# Patient Record
Sex: Male | Born: 1940 | Race: White | Hispanic: No | Marital: Married | Smoking: Former smoker
Health system: Southern US, Community
[De-identification: ages and names within clinical notes are randomized; demographics above are authoritative.]

## PROBLEM LIST (undated history)

## (undated) DIAGNOSIS — F0390 Unspecified dementia without behavioral disturbance: Secondary | ICD-10-CM

## (undated) DIAGNOSIS — E119 Type 2 diabetes mellitus without complications: Secondary | ICD-10-CM

## (undated) DIAGNOSIS — E7800 Pure hypercholesterolemia, unspecified: Secondary | ICD-10-CM

## (undated) DIAGNOSIS — T148XXA Other injury of unspecified body region, initial encounter: Secondary | ICD-10-CM

## (undated) DIAGNOSIS — J189 Pneumonia, unspecified organism: Secondary | ICD-10-CM

## (undated) DIAGNOSIS — I639 Cerebral infarction, unspecified: Secondary | ICD-10-CM

## (undated) DIAGNOSIS — F32A Depression, unspecified: Secondary | ICD-10-CM

## (undated) DIAGNOSIS — K219 Gastro-esophageal reflux disease without esophagitis: Secondary | ICD-10-CM

## (undated) DIAGNOSIS — E0590 Thyrotoxicosis, unspecified without thyrotoxic crisis or storm: Secondary | ICD-10-CM

## (undated) DIAGNOSIS — I719 Aortic aneurysm of unspecified site, without rupture: Secondary | ICD-10-CM

## (undated) DIAGNOSIS — I509 Heart failure, unspecified: Secondary | ICD-10-CM

## (undated) DIAGNOSIS — F329 Major depressive disorder, single episode, unspecified: Secondary | ICD-10-CM

## (undated) DIAGNOSIS — C4490 Unspecified malignant neoplasm of skin, unspecified: Secondary | ICD-10-CM

## (undated) DIAGNOSIS — G4730 Sleep apnea, unspecified: Secondary | ICD-10-CM

## (undated) DIAGNOSIS — I739 Peripheral vascular disease, unspecified: Secondary | ICD-10-CM

## (undated) DIAGNOSIS — E669 Obesity, unspecified: Secondary | ICD-10-CM

## (undated) DIAGNOSIS — R350 Frequency of micturition: Secondary | ICD-10-CM

## (undated) DIAGNOSIS — I4891 Unspecified atrial fibrillation: Secondary | ICD-10-CM

## (undated) DIAGNOSIS — Z952 Presence of prosthetic heart valve: Secondary | ICD-10-CM

## (undated) DIAGNOSIS — R339 Retention of urine, unspecified: Secondary | ICD-10-CM

## (undated) DIAGNOSIS — I2510 Atherosclerotic heart disease of native coronary artery without angina pectoris: Secondary | ICD-10-CM

## (undated) DIAGNOSIS — I10 Essential (primary) hypertension: Secondary | ICD-10-CM

## (undated) HISTORY — DX: Unspecified atrial fibrillation: I48.91

## (undated) HISTORY — DX: Unspecified dementia, unspecified severity, without behavioral disturbance, psychotic disturbance, mood disturbance, and anxiety: F03.90

## (undated) HISTORY — DX: Gastro-esophageal reflux disease without esophagitis: K21.9

## (undated) HISTORY — DX: Unspecified malignant neoplasm of skin, unspecified: C44.90

## (undated) HISTORY — PX: NECK SURGERY: SHX720

## (undated) HISTORY — PX: HERNIA REPAIR: SHX51

## (undated) HISTORY — DX: Obesity, unspecified: E66.9

## (undated) HISTORY — DX: Atherosclerotic heart disease of native coronary artery without angina pectoris: I25.10

## (undated) HISTORY — DX: Frequency of micturition: R35.0

## (undated) HISTORY — DX: Other injury of unspecified body region, initial encounter: T14.8XXA

## (undated) HISTORY — DX: Pure hypercholesterolemia, unspecified: E78.00

## (undated) HISTORY — DX: Heart failure, unspecified: I50.9

## (undated) HISTORY — DX: Depression, unspecified: F32.A

## (undated) HISTORY — DX: Pneumonia, unspecified organism: J18.9

## (undated) HISTORY — DX: Essential (primary) hypertension: I10

## (undated) HISTORY — DX: Major depressive disorder, single episode, unspecified: F32.9

## (undated) HISTORY — PX: CIRCUMCISION: SUR203

## (undated) HISTORY — DX: Retention of urine, unspecified: R33.9

## (undated) HISTORY — PX: CORONARY ARTERY BYPASS GRAFT: SHX141

## (undated) HISTORY — PX: CARDIAC SURGERY: SHX584

## (undated) HISTORY — DX: Type 2 diabetes mellitus without complications: E11.9

## (undated) HISTORY — DX: Sleep apnea, unspecified: G47.30

## (undated) HISTORY — PX: OTHER SURGICAL HISTORY: SHX169

---

## 2000-12-26 ENCOUNTER — Encounter: Payer: Self-pay | Admitting: Family Medicine

## 2000-12-26 ENCOUNTER — Ambulatory Visit (HOSPITAL_COMMUNITY): Admission: RE | Admit: 2000-12-26 | Discharge: 2000-12-26 | Payer: Self-pay | Admitting: Family Medicine

## 2001-01-22 ENCOUNTER — Ambulatory Visit (HOSPITAL_COMMUNITY): Admission: RE | Admit: 2001-01-22 | Discharge: 2001-01-22 | Payer: Self-pay | Admitting: Cardiology

## 2001-01-22 ENCOUNTER — Encounter: Payer: Self-pay | Admitting: Cardiology

## 2001-01-26 ENCOUNTER — Ambulatory Visit (HOSPITAL_COMMUNITY): Admission: RE | Admit: 2001-01-26 | Discharge: 2001-01-26 | Payer: Self-pay | Admitting: Cardiology

## 2001-02-23 ENCOUNTER — Encounter: Payer: Self-pay | Admitting: Cardiothoracic Surgery

## 2001-02-27 ENCOUNTER — Encounter: Payer: Self-pay | Admitting: Cardiothoracic Surgery

## 2001-02-27 ENCOUNTER — Inpatient Hospital Stay (HOSPITAL_COMMUNITY): Admission: RE | Admit: 2001-02-27 | Discharge: 2001-03-05 | Payer: Self-pay | Admitting: Cardiothoracic Surgery

## 2001-02-28 ENCOUNTER — Encounter: Payer: Self-pay | Admitting: Cardiothoracic Surgery

## 2001-03-01 ENCOUNTER — Encounter: Payer: Self-pay | Admitting: Cardiothoracic Surgery

## 2001-03-02 ENCOUNTER — Encounter: Payer: Self-pay | Admitting: Cardiothoracic Surgery

## 2001-04-03 ENCOUNTER — Encounter (HOSPITAL_COMMUNITY): Admission: RE | Admit: 2001-04-03 | Discharge: 2001-07-02 | Payer: Self-pay | Admitting: Cardiology

## 2002-02-14 ENCOUNTER — Encounter: Payer: Self-pay | Admitting: Emergency Medicine

## 2002-02-14 ENCOUNTER — Inpatient Hospital Stay (HOSPITAL_COMMUNITY): Admission: EM | Admit: 2002-02-14 | Discharge: 2002-02-16 | Payer: Self-pay | Admitting: Emergency Medicine

## 2004-03-05 ENCOUNTER — Ambulatory Visit: Payer: Self-pay | Admitting: Cardiology

## 2004-06-25 ENCOUNTER — Ambulatory Visit: Payer: Self-pay | Admitting: Family Medicine

## 2004-12-13 ENCOUNTER — Ambulatory Visit: Payer: Self-pay | Admitting: Cardiology

## 2004-12-31 ENCOUNTER — Ambulatory Visit: Payer: Self-pay

## 2004-12-31 ENCOUNTER — Ambulatory Visit: Payer: Self-pay | Admitting: Cardiology

## 2005-01-11 ENCOUNTER — Ambulatory Visit: Payer: Self-pay

## 2005-01-27 ENCOUNTER — Ambulatory Visit: Payer: Self-pay | Admitting: Cardiology

## 2008-08-26 ENCOUNTER — Encounter: Payer: Self-pay | Admitting: Cardiology

## 2013-07-02 ENCOUNTER — Encounter: Payer: Self-pay | Admitting: *Deleted

## 2013-07-03 ENCOUNTER — Encounter (INDEPENDENT_AMBULATORY_CARE_PROVIDER_SITE_OTHER): Payer: Self-pay

## 2013-07-03 ENCOUNTER — Encounter: Payer: Self-pay | Admitting: Neurology

## 2013-07-03 ENCOUNTER — Ambulatory Visit (INDEPENDENT_AMBULATORY_CARE_PROVIDER_SITE_OTHER): Payer: Medicare Other | Admitting: Neurology

## 2013-07-03 VITALS — BP 161/92 | HR 83 | Ht 70.0 in | Wt 193.0 lb

## 2013-07-03 DIAGNOSIS — R2681 Unsteadiness on feet: Secondary | ICD-10-CM

## 2013-07-03 DIAGNOSIS — R269 Unspecified abnormalities of gait and mobility: Secondary | ICD-10-CM

## 2013-07-03 DIAGNOSIS — R4189 Other symptoms and signs involving cognitive functions and awareness: Secondary | ICD-10-CM

## 2013-07-03 DIAGNOSIS — F09 Unspecified mental disorder due to known physiological condition: Secondary | ICD-10-CM

## 2013-07-03 DIAGNOSIS — R251 Tremor, unspecified: Secondary | ICD-10-CM

## 2013-07-03 DIAGNOSIS — R259 Unspecified abnormal involuntary movements: Secondary | ICD-10-CM

## 2013-07-03 NOTE — Progress Notes (Signed)
[facility]    Provider:  Dr Janann Colonel Referring Provider: Lillard Anes,* Primary Care Physician:  Lillard Anes, MD  CC:  Cognitive decline and gait instability  HPI:  Bobby Jacobs is a 73 y.o. male here as a referral from Dr. Henrene Pastor for cognitive decline and gait instability  Gait instability started around 5 to 6 months ago. Started having multiple falls. Started walking slower than usual, takes small shuffling steps. No freezing episodes noted. Has mild tremors of his hands, describes it as more of an action/postural tremor. Overall bradykinesia, lack of energy. No REM behavior disorder. Noted micrographia. Was given diagnosis of Parkinson's disease a few weeks ago, wife is not sure what type of doctor made this diagnosis.  Was not started on any medications. Has not done any physical therapy.   Wife notes cognitive decline for the past 7 to 8 years, has gotten progressively worse. Main difficulty with short term memory, trouble with basic ADLs, cleaning up. Remote memory remains stable. No hallucinations. Has recently had urinary incontinence, started around 1 year ago and progressively worse.   No family history of Parkinson's disease or other neurodegenerative disorders.   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  Review of Systems: Out of a complete 14 system review, the patient complains of only the following symptoms, and all other reviewed systems are negative. + fatgiue, cough, memory loss, confusion, sleepiness, snoring, slurred speech  History   Social History  . Marital Status: Single    Spouse Name: N/A    Number of Children: N/A  . Years of Education: N/A   Occupational History  . Not on file.   Social History Main Topics  . Smoking status: Never Smoker   . Smokeless tobacco: Never Used  . Alcohol Use: No  . Drug Use: No  . Sexual Activity: Not on file   Other Topics Concern   . Not on file   Social History Narrative   Married with 2 children   Right handed   college   3-4 cups daily    No family history on file.  Past Medical History  Diagnosis Date  . Hypertension   . Diabetes mellitus, type 2   . Skin cancer   . Coronary artery disease   . Depression   . GERD (gastroesophageal reflux disease)   . Cardiovascular disease   . High cholesterol   . Pneumonia   . Fracture     Past Surgical History  Procedure Laterality Date  . Hernia repair    . Cardiac surgery    . Neck surgery    . Degenerative disk disease    . Circumcision      Current Outpatient Prescriptions  Medication Sig Dispense Refill  . AMLODIPINE BESYLATE PO Take 10 mg by mouth.      Marland Kitchen aspirin 81 MG tablet Take 81 mg by mouth daily.      Marland Kitchen gabapentin (NEURONTIN) 300 MG capsule Take 300 mg by mouth 3 (three) times daily.      Marland Kitchen glucose blood test strip 1 each by Other route as needed for other. Use as instructed      . insulin detemir (LEVEMIR) 100 UNIT/ML injection Inject into the skin at bedtime.      . Insulin Pen Needle (NOVOFINE) 32G X 6 MM MISC by Does not apply route.      Marland Kitchen LORazepam (ATIVAN) 0.5 MG tablet Take 0.5 mg  by mouth every 8 (eight) hours.      Marland Kitchen METFORMIN HCL PO Take 1,000 mg by mouth.      Marland Kitchen omeprazole (PRILOSEC) 20 MG capsule Take 20 mg by mouth daily.      . QUEtiapine (SEROQUEL) 50 MG tablet Take 50 mg by mouth at bedtime.      . tamsulosin (FLOMAX) 0.4 MG CAPS capsule Take 0.4 mg by mouth.      . traZODone (DESYREL) 100 MG tablet Take 100 mg by mouth at bedtime.      . valsartan (DIOVAN) 80 MG tablet Take 80 mg by mouth daily.      Marland Kitchen warfarin (COUMADIN) 1 MG tablet Take 1 mg by mouth daily.      Marland Kitchen warfarin (COUMADIN) 2 MG tablet Take 2 mg by mouth daily.      Marland Kitchen warfarin (COUMADIN) 3 MG tablet Take 3 mg by mouth daily.      Marland Kitchen warfarin (COUMADIN) 5 MG tablet Take 5 mg by mouth daily.       No current facility-administered medications for this visit.     Allergies as of 07/03/2013  . (No Known Allergies)    Vitals: BP 161/92  Pulse 83  Ht 5\' 10"  (1.778 m)  Wt 193 lb (87.544 kg)  BMI 27.69 kg/m2 Last Weight:  Wt Readings from Last 1 Encounters:  07/03/13 193 lb (87.544 kg)   Last Height:   Ht Readings from Last 1 Encounters:  07/03/13 5\' 10"  (1.778 m)     Physical exam: Exam: Gen: NAD, conversant Eyes: anicteric sclerae, moist conjunctivae HENT: Atraumatic, oropharynx clear Neck: Trachea midline; supple,  Lungs: CTA, no wheezing, rales, rhonic                          CV: RRR, no MRG Abdomen: Soft, non-tender;  Extremities: No peripheral edema  Skin: Normal temperature, no rash,  Psych: Appropriate affect, pleasant  Neuro: MS: AA&Ox1, appropriately interactive  Speech: decreased verbal output  CN: PERRL, EOMI no nystagmus, no ptosis, sensation intact to LT V1-V3 bilat, face symmetric, no weakness, hearing grossly intact, palate elevates symmetrically, shoulder shrug 5/5 bilat,  tongue protrudes midline, no fasiculations noted.  Motor: normal bulk and tone Strength: 5/5  In all extremities  Coord: mild rest tremor bilateral hands L>R, mild bradykinesia with finger taps, hand opening, foot tapping  Reflexes: symmetrical, bilat downgoing toes  Sens: LT intact in all extremities, decreased vibration mid shin bilaterally  Gait: stands slowly, slightly stooped posture, decreased arm swing on the left   Assessment:  After physical and neurologic examination, review of laboratory studies, imaging, neurophysiology testing and pre-existing records, assessment will be reviewed on the problem list.  Plan:  Treatment plan and additional workup will be reviewed under Problem List.  1)Gait instability 2)Cognitive decline 3)tremor 4)Urinary incontinence 5)A fib, prior CVA  73y/o gentleman presenting for initial evaluation of gait instability, cognitive decline, tremor which has been getting progressively worse.  Constellation of symptoms is concerning for a parkinsonism, most notably NPH. Will check MRI brain, B12, TSH, MMA. In the future will check MOCA and can consider large volume LP depending on MRI results. Pending lab results would consider addition of SInemet and/or Aricept. Consider PT referral in the future. Counseled patient and spouse to follow up with PCP in regards to stopping ASA. No neurological indication to being on both coumadin and ASA as this can increase bleed risk, especially in a patient  prone to falls.    Jim Like, DO  [facility] [address] [place], [place] [address]  Phone [phone] Fax [phone]

## 2013-07-03 NOTE — Patient Instructions (Signed)
Overall you are doing fairly well but I do want to suggest a few things today:   Remember to drink plenty of fluid, eat healthy meals and do not skip any meals. Try to eat protein with a every meal and eat a healthy snack such as fruit or nuts in between meals. Try to keep a regular sleep-wake schedule and try to exercise daily, particularly in the form of walking, 20-30 minutes a day, if you can.   As far as your medications are concerned, I would like to suggest the following: 1)Please discuss stopping the aspirin with your primary care doctor. Being on both aspirin and coumadin increases your risk of having a bleed  As far as diagnostic testing:  1)Please have some blood work completed today 2)I would like you to have a MRI of the brain, you will be called to schedule this  We will follow up once the above testing is completed. Please call us with any interim questions, concerns, problems, updates or refill requests.   Please also call us for any test results so we can go over those with you on the phone.  My clinical assistant and will answer any of your questions and relay your messages to me and also relay most of my messages to you.   Our phone number is [phone]. We also have an after hours call service for urgent matters and there is a physician on-call for urgent questions. For any emergencies you know to call 911 or go to the nearest emergency room

## 2013-07-05 LAB — METHYLMALONIC ACID, SERUM: METHYLMALONIC ACID: 208 nmol/L (ref 0–378)

## 2013-07-05 LAB — TSH: TSH: 0.096 u[IU]/mL — ABNORMAL LOW (ref 0.450–4.500)

## 2013-07-05 LAB — VITAMIN B12: Vitamin B-12: 1398 pg/mL — ABNORMAL HIGH (ref 211–946)

## 2013-07-05 NOTE — Progress Notes (Signed)
Quick Note:  Left voice message on emergency contact's phone to have patient call back with important lab results. ______

## 2013-07-08 ENCOUNTER — Telehealth: Payer: Self-pay | Admitting: *Deleted

## 2013-07-08 NOTE — Telephone Encounter (Signed)
Informed patient's wife of lab results, instructed patient to call PCP to schedule appointment, labs were also sent to PCP.

## 2013-07-08 NOTE — Telephone Encounter (Signed)
Patient's wife returning my call for lab results.

## 2013-07-12 ENCOUNTER — Inpatient Hospital Stay: Admission: RE | Admit: 2013-07-12 | Payer: Self-pay | Source: Ambulatory Visit

## 2013-07-19 ENCOUNTER — Ambulatory Visit
Admission: RE | Admit: 2013-07-19 | Discharge: 2013-07-19 | Disposition: A | Discharge status: Home / Self Care | Payer: Medicare Other | Source: Ambulatory Visit | Attending: Neurology | Admitting: Neurology

## 2013-07-19 DIAGNOSIS — R251 Tremor, unspecified: Secondary | ICD-10-CM

## 2013-07-19 DIAGNOSIS — R4189 Other symptoms and signs involving cognitive functions and awareness: Secondary | ICD-10-CM

## 2013-07-19 DIAGNOSIS — R2681 Unsteadiness on feet: Secondary | ICD-10-CM

## 2013-07-19 DIAGNOSIS — R259 Unspecified abnormal involuntary movements: Secondary | ICD-10-CM

## 2013-07-19 DIAGNOSIS — R269 Unspecified abnormalities of gait and mobility: Secondary | ICD-10-CM

## 2013-07-24 ENCOUNTER — Telehealth: Payer: Self-pay | Admitting: *Deleted

## 2013-07-24 NOTE — Telephone Encounter (Signed)
Message copied by Vivi Barrack on Wed Jul 24, 2013 10:30 AM ------      Message from: Drema Dallas      Created: Tue Jul 23, 2013 12:35 PM       Please have him schedule a follow up to discuss MRI and next steps. Thanks. ------

## 2013-07-24 NOTE — Telephone Encounter (Signed)
Spoke to patient and wife the are scheduled to come in on 08-07-13 to discuss MRI and next step.

## 2013-08-07 ENCOUNTER — Non-Acute Institutional Stay (SKILLED_NURSING_FACILITY): Payer: Medicare Other | Admitting: Internal Medicine

## 2013-08-07 ENCOUNTER — Ambulatory Visit: Payer: Self-pay | Admitting: Neurology

## 2013-08-07 DIAGNOSIS — I509 Heart failure, unspecified: Secondary | ICD-10-CM

## 2013-08-07 DIAGNOSIS — I4891 Unspecified atrial fibrillation: Secondary | ICD-10-CM

## 2013-08-07 DIAGNOSIS — F0150 Vascular dementia without behavioral disturbance: Secondary | ICD-10-CM

## 2013-08-07 DIAGNOSIS — I482 Chronic atrial fibrillation, unspecified: Secondary | ICD-10-CM

## 2013-08-07 DIAGNOSIS — F0151 Vascular dementia with behavioral disturbance: Secondary | ICD-10-CM

## 2013-08-07 DIAGNOSIS — N390 Urinary tract infection, site not specified: Secondary | ICD-10-CM

## 2013-08-08 DIAGNOSIS — I4891 Unspecified atrial fibrillation: Secondary | ICD-10-CM | POA: Insufficient documentation

## 2013-08-08 DIAGNOSIS — F0150 Vascular dementia without behavioral disturbance: Secondary | ICD-10-CM | POA: Insufficient documentation

## 2013-08-08 DIAGNOSIS — N390 Urinary tract infection, site not specified: Secondary | ICD-10-CM | POA: Insufficient documentation

## 2013-08-08 DIAGNOSIS — I509 Heart failure, unspecified: Secondary | ICD-10-CM | POA: Insufficient documentation

## 2013-08-08 NOTE — Progress Notes (Signed)
HISTORY & PHYSICAL  DATE: 08/07/2013   FACILITY: [facility]  LEVEL OF CARE: SNF (31)  ALLERGIES:  No Known Allergies  CHIEF COMPLAINT:  Manage atrial fibrillation, UTI and CHF  HISTORY OF PRESENT ILLNESS: 73 year old Caucasian male was hospitalized after a syncopal episode. After hospitalization he was admitted to this facility for short-term rehabilitation.  ATRIAL FIBRILLATION: the patients atrial fibrillation remains stable.  The patient denies DOE, tachycardia, orthopnea, transient neurological sx, pedal edema, palpitations, & PNDs.  No complications noted from the medications currently being used.  CHF:The patient does not relate significant weight changes, denies sob, DOE, orthopnea, PNDs, pedal edema, palpitations or chest pain.  CHF remains stable.  No complications form the medications being used. EF 25-30%.  UTI: The UTI remains stable.  The patient denies ongoing suprapubic pain, flank pain, dysuria, urinary frequency, urinary hesitancy or hematuria.  No complications reported from the current antibiotic being used.  PAST MEDICAL HISTORY :  Past Medical History  Diagnosis Date  . Hypertension   . Diabetes mellitus, type 2   . Skin cancer   . Coronary artery disease   . Depression   . GERD (gastroesophageal reflux disease)   . Cardiovascular disease   . High cholesterol   . Pneumonia   . Fracture     PAST SURGICAL HISTORY: Past Surgical History  Procedure Laterality Date  . Hernia repair    . Cardiac surgery    . Neck surgery    . Degenerative disk disease    . Circumcision      SOCIAL HISTORY:  reports that he has never smoked. He has never used smokeless tobacco. He reports that he does not drink alcohol or use illicit drugs.  FAMILY HISTORY: None  CURRENT MEDICATIONS: Reviewed per MAR/see medication list  REVIEW OF SYSTEMS:  See HPI otherwise 14 point ROS is negative.  PHYSICAL EXAMINATION  VS:  See VS  section  GENERAL: no acute distress, normal body habitus EYES: conjunctivae normal, sclerae normal, normal eye lids MOUTH/THROAT: lips without lesions,no lesions in the mouth,tongue is without lesions,uvula elevates in midline NECK: supple, trachea midline, no neck masses, no thyroid tenderness, no thyromegaly LYMPHATICS: no LAN in the neck, no supraclavicular LAN RESPIRATORY: breathing is even & unlabored, BS CTAB CARDIAC: Heart rate is irregularly irregular, no murmur,no extra heart sounds, no edema GI:  ABDOMEN: abdomen soft, normal BS, no masses, no tenderness  LIVER/SPLEEN: no hepatomegaly, no splenomegaly MUSCULOSKELETAL: HEAD: normal to inspection  EXTREMITIES: LEFT UPPER EXTREMITY: full range of motion, normal strength & tone RIGHT UPPER EXTREMITY:  full range of motion, normal strength & tone LEFT LOWER EXTREMITY:  Moderate range of motion, normal strength & tone RIGHT LOWER EXTREMITY: Moderate range of motion, normal strength & tone PSYCHIATRIC: the patient is alert & oriented to person, affect & behavior appropriate  LABS/RADIOLOGY:  08-06-13 WBC 15, hemoglobin 13.8, platelets 240, BUN 32 otherwise BMP normal, magnesium 1.7, phosphorus 4, albumin 3, total protein 5.7, AST 79 otherwise liver profile normal, INR 2.8, PTT 40.1  08-03-13 chest x-ray-possible early reticulonodular opacities in the left mid lung Right hip x-ray-no fracture or dislocation CT of the head-no acute finding Pelvic x-ray-no acute fracture  ASSESSMENT/PLAN:  Atrial fibrillation-rate controlled CHF-compensated UTI-continue cefdinir as prescribed Dementia-continue current medications Insomnia-continue trazodone Hypertension-well-controlled Diabetes mellitus-continue Levemir Check CBC with differential  I have reviewed patient's medical records received at admission/from hospitalization.  CPT CODE: 26203  Bobby Jacobs, [place]  [facility] [phone]

## 2013-08-12 ENCOUNTER — Non-Acute Institutional Stay (SKILLED_NURSING_FACILITY): Payer: Medicare Other | Admitting: Internal Medicine

## 2013-08-12 DIAGNOSIS — D638 Anemia in other chronic diseases classified elsewhere: Secondary | ICD-10-CM

## 2013-08-12 DIAGNOSIS — D7289 Other specified disorders of white blood cells: Secondary | ICD-10-CM

## 2013-08-15 ENCOUNTER — Non-Acute Institutional Stay (SKILLED_NURSING_FACILITY): Payer: Medicare Other | Admitting: Internal Medicine

## 2013-08-15 DIAGNOSIS — E1149 Type 2 diabetes mellitus with other diabetic neurological complication: Secondary | ICD-10-CM

## 2013-08-15 DIAGNOSIS — D638 Anemia in other chronic diseases classified elsewhere: Secondary | ICD-10-CM | POA: Insufficient documentation

## 2013-08-15 NOTE — Progress Notes (Signed)
Patient ID: Bobby Jacobs, male   DOB: [birth year], 73 y.o.   MRN: [id number]           PROGRESS NOTE  DATE: 08/12/2013        FACILITY:  [facility]  LEVEL OF CARE: SNF (31)  Acute Visit  CHIEF COMPLAINT:  Manage leukocytosis and anemia.    HISTORY OF PRESENT ILLNESS: I was requested by the staff to assess the patient regarding above problem(s):  ANEMIA: The anemia has been stable. The patient denies fatigue, melena or hematochezia. No complications from the medications currently being used.  On 08/08/2013:  Hemoglobin 12.2, MCV 84.  On 08/06/2013:  Hemoglobin 13.8.    LEUKOCYTOSIS:  On 08/08/2013:  WBC 13.5, ANC 9.5.  On 08/06/2013:  WBC 15.  Patient was diagnosed with a UTI and was being treated.    PAST MEDICAL HISTORY : Reviewed.  No changes/see problem list  CURRENT MEDICATIONS: Reviewed per MAR/see medication list  REVIEW OF SYSTEMS:  GENERAL: no change in appetite, no fatigue, no weight changes, no fever, chills or weakness RESPIRATORY: no cough, SOB, DOE,, wheezing, hemoptysis CARDIAC: no chest pain, edema or palpitations GI: no abdominal pain, diarrhea, constipation, heart burn, nausea or vomiting  PHYSICAL EXAMINATION  VS:  T 98       P 70      RR 17      BP 110/70        WT (Lb) 190  GENERAL: no acute distress, normal body habitus NECK: supple, trachea midline, no neck masses, no thyroid tenderness, no thyromegaly RESPIRATORY: breathing is even & unlabored, BS CTAB CARDIAC: RRR, no murmur,no extra heart sounds, no edema GI: abdomen soft, normal BS, no masses, no tenderness, no hepatomegaly, no splenomegaly PSYCHIATRIC: the patient is alert & oriented to person, affect & behavior appropriate  ASSESSMENT/PLAN:  Anemia-new problem.  Will monitor.  Leukocytosis-secondary to UTI.  WBC improved.  Will monitor.  CPT CODE: 26333  Nastashia Gallo Y Najwa Spillane, [place] [phone]

## 2013-08-20 DIAGNOSIS — E1149 Type 2 diabetes mellitus with other diabetic neurological complication: Secondary | ICD-10-CM | POA: Insufficient documentation

## 2013-08-20 NOTE — Progress Notes (Signed)
Patient ID: Bobby Jacobs, male   DOB: 02-Sep-1940, 73 y.o.   MRN: [id number]           PROGRESS NOTE  DATE: 08/15/2013         FACILITY:  [facility]  LEVEL OF CARE: SNF (31)  Acute Visit  CHIEF COMPLAINT:  Manage diabetes mellitus.    HISTORY OF PRESENT ILLNESS: I was requested by the staff to assess the patient regarding above problem(s):  DM:pt's DM is unstable.  Staff report that CBGs are running high.  At 6:30 in the morning, CBGs range from 106 to 234.  At 11:30 a.m., values are in the 200-300 range.  Again, at 4:30 and 9:00 p.m., values are in the 300 range.  Pt denies polyuria, polydipsia, polyphagia, changes in vision or hypoglycemic episodes.  No complications noted from the medication presently being used.  Last hemoglobin A1c is:   Not available.     PAST MEDICAL HISTORY : Reviewed.  No changes/see problem list  CURRENT MEDICATIONS: Reviewed per MAR/see medication list  REVIEW OF SYSTEMS:  GENERAL: no change in appetite, no fatigue, no weight changes, no fever, chills or weakness RESPIRATORY: no cough, SOB, DOE,, wheezing, hemoptysis CARDIAC: no chest pain, edema or palpitations GI: no abdominal pain, diarrhea, constipation, heart burn, nausea or vomiting  PHYSICAL EXAMINATION  VS: see VS section  GENERAL: no acute distress, normal body habitus NECK: supple, trachea midline, no neck masses, no thyroid tenderness, no thyromegaly RESPIRATORY: breathing is even & unlabored, BS CTAB CARDIAC: RRR, no murmur,no extra heart sounds, no edema GI: abdomen soft, normal BS, no masses, no tenderness, no hepatomegaly, no splenomegaly PSYCHIATRIC: the patient is alert & oriented to person, affect & behavior appropriate  ASSESSMENT/PLAN:  Diabetes mellitus with neurological complications.  Uncontrolled.  Increase Levemir to 35 U q.h.s.    CPT CODE: 44818          Krue Peterka Y Dmari Schubring, [place] [phone]

## 2013-08-21 ENCOUNTER — Non-Acute Institutional Stay (SKILLED_NURSING_FACILITY): Payer: Medicare Other | Admitting: Internal Medicine

## 2013-08-21 DIAGNOSIS — E1149 Type 2 diabetes mellitus with other diabetic neurological complication: Secondary | ICD-10-CM

## 2013-08-22 NOTE — Progress Notes (Signed)
         PROGRESS NOTE  DATE: 08/21/2013  FACILITY:  [facility]  LEVEL OF CARE: SNF (31)  Acute Visit  CHIEF COMPLAINT:  Manage diabetes mellitus  HISTORY OF PRESENT ILLNESS: I was requested by the staff to assess the patient regarding above problem(s):  DM:pt's DM is unstable.  Staff deny polyuria, polydipsia, polyphagia, changes in vision or hypoglycemic episodes.  No complications noted from the medication presently being used.  Last hemoglobin A1c is: 9.8 on 08-19-13. Patient is a poor historian.  PAST MEDICAL HISTORY : Reviewed.  No changes/see problem list  CURRENT MEDICATIONS: Reviewed per MAR/see medication list  REVIEW OF SYSTEMS: Unobtainable due to patient being a poor historian  PHYSICAL EXAMINATION  VS: see VS section  GENERAL: no acute distress, normal body habitus NECK: supple, trachea midline, no neck masses, no thyroid tenderness, no thyromegaly RESPIRATORY: breathing is even & unlabored, BS CTAB CARDIAC: RRR, no murmur,no extra heart sounds, no edema GI: abdomen soft, normal BS, no masses, no tenderness, no hepatomegaly, no splenomegaly PSYCHIATRIC: the patient is alert & unable to assess orientation , affect & behavior appropriate  LABS/RADIOLOGY: See history of present illness  ASSESSMENT/PLAN:  Diabetes mellitus with neurological complications-uncontrolled problem. Increase Levemir to 38 units each bedtime.  CPT CODE: 18299  Hassel Uphoff Y Zacary Bauer, [place] [phone]

## 2013-08-23 ENCOUNTER — Encounter: Payer: Self-pay | Admitting: Neurology

## 2013-08-26 ENCOUNTER — Non-Acute Institutional Stay (SKILLED_NURSING_FACILITY): Payer: Medicare Other | Admitting: Internal Medicine

## 2013-08-26 DIAGNOSIS — F0150 Vascular dementia without behavioral disturbance: Secondary | ICD-10-CM

## 2013-08-26 DIAGNOSIS — E1149 Type 2 diabetes mellitus with other diabetic neurological complication: Secondary | ICD-10-CM

## 2013-08-26 DIAGNOSIS — D7289 Other specified disorders of white blood cells: Secondary | ICD-10-CM

## 2013-08-26 DIAGNOSIS — E7800 Pure hypercholesterolemia, unspecified: Secondary | ICD-10-CM

## 2013-08-26 DIAGNOSIS — F0151 Vascular dementia with behavioral disturbance: Secondary | ICD-10-CM

## 2013-08-27 DIAGNOSIS — E7800 Pure hypercholesterolemia, unspecified: Secondary | ICD-10-CM | POA: Insufficient documentation

## 2013-08-27 NOTE — Progress Notes (Signed)
         PROGRESS NOTE  DATE: 08/26/2013  FACILITY: Nursing Home Location: [facility]  LEVEL OF CARE: SNF (31)  Routine Visit  CHIEF COMPLAINT:  Manage diabetes mellitus, hyperlipidemia and dementia  HISTORY OF PRESENT ILLNESS:  REASSESSMENT OF ONGOING PROBLEM(S):  DM:pt's DM is unstable.  Pt denies polyuria, polydipsia, polyphagia, changes in vision or hypoglycemic episodes.  No complications noted from the medication presently being used.  Last hemoglobin A1c is: 9.8 in 7- 15.  DEMENTIA: The dementia remaines stable and continues to function adequately in the current living environment with supervision.  The patient has had little changes in behavior. No complications noted from the medications presently being used.  HYPERLIPIDEMIA: No complications from the medications presently being used. Last fasting lipid panel not available.  PAST MEDICAL HISTORY : Reviewed.  No changes/see problem list  CURRENT MEDICATIONS: Reviewed per MAR/see medication list  REVIEW OF SYSTEMS:  GENERAL: no change in appetite, no fatigue, no weight changes, no fever, chills or weakness RESPIRATORY: no cough, SOB, DOE, wheezing, hemoptysis CARDIAC: no chest pain, edema or palpitations GI: no abdominal pain, diarrhea, constipation, heart burn, nausea or vomiting  PHYSICAL EXAMINATION  VS:  See VS section  GENERAL: no acute distress, normal body habitus EYES: conjunctivae normal, sclerae normal, normal eye lids NECK: supple, trachea midline, no neck masses, no thyroid tenderness, no thyromegaly LYMPHATICS: no LAN in the neck, no supraclavicular LAN RESPIRATORY: breathing is even & unlabored, BS CTAB CARDIAC: RRR, no murmur,no extra heart sounds, no edema GI: abdomen soft, normal BS, no masses, no tenderness, no hepatomegaly, no splenomegaly PSYCHIATRIC: the patient is alert & oriented to person, affect & behavior appropriate  LABS/RADIOLOGY:  7-15 WBC 13.1, hemoglobin  12.2, MCV 84, platelets 260  ASSESSMENT/PLAN:  Diabetes mellitus with neurologic complications-uncontrolled. levemir was increased. dementia-- check TSH and vitamin B12 level Hyperlipidemia-check fasting lipid panel Leukocytosis-was secondary to her UTI. Recheck. Hypertension-well-controlled GERD-continue PPI Neuropathy-continue Neurontin Insomnia-continue trazodone Check CMP  CPT CODE: 16109  Merlene Laughter, [place] [phone]

## 2013-09-05 ENCOUNTER — Ambulatory Visit: Payer: Self-pay | Admitting: Neurology

## 2013-09-18 ENCOUNTER — Non-Acute Institutional Stay (SKILLED_NURSING_FACILITY): Payer: Medicare Other | Admitting: Internal Medicine

## 2013-09-18 DIAGNOSIS — F0150 Vascular dementia without behavioral disturbance: Secondary | ICD-10-CM

## 2013-09-18 DIAGNOSIS — E7800 Pure hypercholesterolemia, unspecified: Secondary | ICD-10-CM

## 2013-09-18 DIAGNOSIS — E1149 Type 2 diabetes mellitus with other diabetic neurological complication: Secondary | ICD-10-CM

## 2013-09-18 DIAGNOSIS — F0151 Vascular dementia with behavioral disturbance: Secondary | ICD-10-CM

## 2013-09-23 NOTE — Progress Notes (Signed)
         PROGRESS NOTE  DATE: 09-18-13  FACILITY: Nursing Home Location: [facility]  LEVEL OF CARE: SNF (31)  Discharge Visit  CHIEF COMPLAINT:  Manage diabetes mellitus, hyperlipidemia and dementia  HISTORY OF PRESENT ILLNESS: I was requested by the social worker to perform face-to-face evaluation for discharge.  REASSESSMENT OF ONGOING PROBLEM(S):  DM:pt's DM is unstable.  Pt denies polyuria, polydipsia, polyphagia, changes in vision or hypoglycemic episodes.  No complications noted from the medication presently being used.  Last hemoglobin A1c is: 9.8 in 7- 15.  DEMENTIA: The dementia remaines stable and continues to function adequately in the current living environment with supervision.  The patient has had little changes in behavior. No complications noted from the medications presently being used.  HYPERLIPIDEMIA: No complications from the medications presently being used. Last fasting lipid panel not available.  PAST MEDICAL HISTORY : Reviewed.  No changes/see problem list  CURRENT MEDICATIONS: Reviewed per MAR/see medication list  REVIEW OF SYSTEMS:  GENERAL: no change in appetite, no fatigue, no weight changes, no fever, chills or weakness RESPIRATORY: no cough, SOB, DOE, wheezing, hemoptysis CARDIAC: no chest pain, edema or palpitations GI: no abdominal pain, diarrhea, constipation, heart burn, nausea or vomiting  PHYSICAL EXAMINATION  VS:  See VS section  GENERAL: no acute distress, normal body habitus NECK: supple, trachea midline, no neck masses, no thyroid tenderness, no thyromegaly RESPIRATORY: breathing is even & unlabored, BS CTAB CARDIAC: RRR, no murmur,no extra heart sounds, no edema GI: abdomen soft, normal BS, no masses, no tenderness, no hepatomegaly, no splenomegaly PSYCHIATRIC: the patient is alert & oriented to person, affect & behavior appropriate  LABS/RADIOLOGY:  7-15 WBC 13.1, hemoglobin 12.2, MCV 84, platelets  260  ASSESSMENT/PLAN:  Diabetes mellitus with neurologic complications-uncontrolled. levemir was increased. dementia-- stable Hyperlipidemia-continue statin Leukocytosis-was secondary to her UTI.  Hypertension-well-controlled GERD-continue PPI Neuropathy-continue Neurontin Insomnia-continue trazodone  The patient will be discharged to [facility] family care in [place] on 09-20-13.  CPT CODE: 64680  Gayani Y Dasanayaka, [place] [phone]

## 2013-11-13 ENCOUNTER — Telehealth: Payer: Self-pay | Admitting: Neurology

## 2013-11-13 NOTE — Telephone Encounter (Signed)
Prior Dr. Janann Colonel patient. Wife stated that patient is in a nursing home and they will call us if they need our services in the future. 11/13/13 gb

## 2013-11-28 ENCOUNTER — Emergency Department: Payer: Self-pay | Admitting: Emergency Medicine

## 2013-11-28 LAB — URINALYSIS, COMPLETE
Bacteria: NONE SEEN
Bilirubin,UR: NEGATIVE
Glucose,UR: NEGATIVE mg/dL (ref 0–75)
Ketone: NEGATIVE
LEUKOCYTE ESTERASE: NEGATIVE
Nitrite: NEGATIVE
Ph: 5 (ref 4.5–8.0)
Protein: NEGATIVE
RBC,UR: 10 /HPF (ref 0–5)
SQUAMOUS EPITHELIAL: NONE SEEN
Specific Gravity: 1.015 (ref 1.003–1.030)

## 2013-11-28 LAB — CBC
HCT: 42.2 % (ref 40.0–52.0)
HGB: 13.8 g/dL (ref 13.0–18.0)
MCH: 28.4 pg (ref 26.0–34.0)
MCHC: 32.6 g/dL (ref 32.0–36.0)
MCV: 87 fL (ref 80–100)
Platelet: 208 10*3/uL (ref 150–440)
RBC: 4.86 10*6/uL (ref 4.40–5.90)
RDW: 16.3 % — ABNORMAL HIGH (ref 11.5–14.5)
WBC: 10.1 10*3/uL (ref 3.8–10.6)

## 2013-11-28 LAB — PROTIME-INR
INR: 2
Prothrombin Time: 22.1 secs — ABNORMAL HIGH (ref 11.5–14.7)

## 2013-11-28 LAB — COMPREHENSIVE METABOLIC PANEL
Albumin: 3.8 g/dL (ref 3.4–5.0)
Alkaline Phosphatase: 117 U/L — ABNORMAL HIGH
Anion Gap: 4 — ABNORMAL LOW (ref 7–16)
BUN: 26 mg/dL — AB (ref 7–18)
Bilirubin,Total: 0.8 mg/dL (ref 0.2–1.0)
Calcium, Total: 8.1 mg/dL — ABNORMAL LOW (ref 8.5–10.1)
Chloride: 108 mmol/L — ABNORMAL HIGH (ref 98–107)
Co2: 32 mmol/L (ref 21–32)
Creatinine: 1.38 mg/dL — ABNORMAL HIGH (ref 0.60–1.30)
EGFR (African American): >60
EGFR (Non-African Amer.): 53 — ABNORMAL LOW
Glucose: 145 mg/dL — ABNORMAL HIGH (ref 65–99)
Osmolality: 294 (ref 275–301)
Potassium: 3.9 mmol/L (ref 3.5–5.1)
SGOT(AST): 24 U/L (ref 15–37)
SGPT (ALT): 20 U/L
SODIUM: 144 mmol/L (ref 136–145)
Total Protein: 7.3 g/dL (ref 6.4–8.2)

## 2014-02-12 ENCOUNTER — Other Ambulatory Visit: Payer: Self-pay | Admitting: Internal Medicine

## 2014-04-21 ENCOUNTER — Other Ambulatory Visit: Payer: Self-pay

## 2014-07-10 ENCOUNTER — Ambulatory Visit: Payer: Self-pay | Admitting: Urology

## 2014-07-11 ENCOUNTER — Ambulatory Visit (INDEPENDENT_AMBULATORY_CARE_PROVIDER_SITE_OTHER): Payer: Medicare Other | Admitting: Urology

## 2014-07-11 ENCOUNTER — Encounter: Payer: Self-pay | Admitting: Urology

## 2014-07-11 VITALS — BP 120/72 | HR 76 | Ht 71.0 in | Wt 195.4 lb

## 2014-07-11 DIAGNOSIS — N400 Enlarged prostate without lower urinary tract symptoms: Secondary | ICD-10-CM | POA: Diagnosis not present

## 2014-07-11 DIAGNOSIS — R338 Other retention of urine: Secondary | ICD-10-CM

## 2014-07-11 DIAGNOSIS — R339 Retention of urine, unspecified: Secondary | ICD-10-CM | POA: Diagnosis not present

## 2014-07-11 DIAGNOSIS — N401 Enlarged prostate with lower urinary tract symptoms: Secondary | ICD-10-CM

## 2014-07-11 NOTE — Progress Notes (Signed)
Catheter Removal  Patient is present today for a catheter removal.  68ml of water was drained from the balloon. A 16FR foley cath was removed from the bladder no complications were noted . Patient tolerated well.  Preformed by: Lyndee Hensen CMA  Follow up/ Additional notes: Patient to come back in one day next week for PVR.

## 2014-07-11 NOTE — Progress Notes (Signed)
07/11/2014 11:46 AM   Bobby Jacobs October 13, 1940 [id number]  Referring provider: Lillard Anes, MD [address] [place],  [address]  Chief Complaint  Patient presents with  . Urinary Retention    Cath exchange    HPI: [name] is a 74 year old white male with a history of urinary retention which is currently managed with an indwelling Foley.  Patient has dementia which caregiver feels is the cause of his urinary retention and is worsened by his sleep apnea. She was hopeful that correction in his sleep apnea would improve his dementia and, in turn, resolve his bladder issues..  Patient had been sleeping with his BPAP machine for the last month. Patient's caregiver has not seen improvement in his dementia.  Patient has had an indwelling Foley since April. He is currently on Flomax 0.4 mg daily.  He is somewhat combative when placing a catheter.  This would make it difficult for a sole provider to catheterize him.     PMH: Past Medical History  Diagnosis Date  . Hypertension   . Diabetes mellitus, type 2   . Skin cancer   . Coronary artery disease   . Depression   . GERD (gastroesophageal reflux disease)   . Cardiovascular disease   . High cholesterol   . Pneumonia   . Fracture   . Obesity   . Sleep apnea   . CHF (congestive heart failure)   . Dementia   . Atrial fibrillation   . Urinary retention   . Urine frequency   . Incomplete bladder emptying     Surgical History: Past Surgical History  Procedure Laterality Date  . Hernia repair    . Cardiac surgery    . Neck surgery    . Degenerative disk disease    . Circumcision      Home Medications:    Medication List       This list is accurate as of: 07/11/14 11:46 AM.  Always use your most recent med list.               AMLODIPINE BESYLATE PO  Take 10 mg by mouth.     aspirin 81 MG tablet  Take 81 mg by mouth daily.     atorvastatin 80 MG tablet  Commonly known as:  LIPITOR   Take 80 mg by mouth daily.     calcium carbonate 600 MG Tabs tablet  Commonly known as:  OS-CAL  Take 600 mg by mouth 2 (two) times daily with a meal.     carbamide peroxide 6.5 % otic solution  Commonly known as:  DEBROX  Place 5 drops into both ears 2 (two) times daily as needed.     FIBER 7 Powd  Take 1 scoop by mouth 3 (three) times daily.     gabapentin 300 MG capsule  Commonly known as:  NEURONTIN  Take 300 mg by mouth 3 (three) times daily.     glucose blood test strip  1 each by Other route as needed for other. Use as instructed     insulin detemir 100 UNIT/ML injection  Commonly known as:  LEVEMIR  Inject into the skin at bedtime.     insulin lispro 100 UNIT/ML injection  Commonly known as:  HUMALOG  Inject into the skin 3 (three) times daily before meals.     HUMALOG KWIKPEN 100 UNIT/ML KiwkPen  Generic drug:  insulin lispro     MAGELLAN INSULIN SAFETY SYR 29G X  1/2" 1 ML Misc  Generic drug:  INSULIN SYRINGE 1CC/29G     Melatonin 5 MG Caps  Take by mouth at bedtime.     metoprolol 50 MG tablet  Commonly known as:  LOPRESSOR  Take 50 mg by mouth 2 (two) times daily.     miconazole 2 % cream  Commonly known as:  MICOTIN  Apply 1 application topically 2 (two) times daily.     NOVOFINE 32G X 6 MM Misc  Generic drug:  Insulin Pen Needle  by Does not apply route.     omeprazole 20 MG capsule  Commonly known as:  PRILOSEC  Take 20 mg by mouth daily.     OMNICEF PO  Take by mouth.     QUEtiapine 50 MG tablet  Commonly known as:  SEROQUEL  Take 50 mg by mouth at bedtime.     ranitidine 150 MG capsule  Commonly known as:  ZANTAC  Take 150 mg by mouth 2 (two) times daily.     ranitidine 150 MG tablet  Commonly known as:  ZANTAC     tamsulosin 0.4 MG Caps capsule  Commonly known as:  FLOMAX  Take 0.4 mg by mouth.     traZODone 100 MG tablet  Commonly known as:  DESYREL  Take 100 mg by mouth at bedtime.     triamcinolone cream 0.1 %  Commonly  known as:  KENALOG     valsartan 80 MG tablet  Commonly known as:  DIOVAN  Take 80 mg by mouth daily.     vitamin B-12 100 MCG tablet  Commonly known as:  CYANOCOBALAMIN  Take 100 mcg by mouth daily.     warfarin 3 MG tablet  Commonly known as:  COUMADIN  Take 3 mg by mouth daily.     warfarin 5 MG tablet  Commonly known as:  COUMADIN  Take 5 mg by mouth daily.     warfarin 2 MG tablet  Commonly known as:  COUMADIN  Take 2 mg by mouth daily.     warfarin 7.5 MG tablet  Commonly known as:  COUMADIN  Take 7.5 mg by mouth daily.     warfarin 1 MG tablet  Commonly known as:  COUMADIN  Take 1 mg by mouth daily.        Allergies: No Known Allergies  Family History: Family History  Problem Relation Age of Onset  . Cancer Neg Hx     Kidney,Bladder ,Prostate    Social History:  reports that he has quit smoking. He has never used smokeless tobacco. He reports that he does not drink alcohol or use illicit drugs.  ROS: Urological Symptom Review  Patient is experiencing the following symptoms: Frequent urination Hard to postpone urination Get up at night to urinate Leakage of urine   Review of Systems  Gastrointestinal (upper)  : Negative for upper GI symptoms  Gastrointestinal (lower) : Constipation  Constitutional : Negative for symptoms  Skin: Negative for skin symptoms  Eyes: Negative for eye symptoms  Ear/Nose/Throat : Negative for Ear/Nose/Throat symptoms  Hematologic/Lymphatic: Negative for Hematologic/Lymphatic symptoms  Cardiovascular : Negative for cardiovascular symptoms  Respiratory : Shortness of breath  Endocrine: Excessive thirst  Musculoskeletal: Back pain  Neurological: Negative for neurological symptoms  Psychologic: Negative for psychiatric symptoms   Physical Exam: BP 120/72 mmHg  Pulse 76  Ht 5\' 11"  (1.803 m)  Wt 195 lb 6.4 oz (88.633 kg)  BMI 27.26 kg/m2   Laboratory Data: Lab Results  Component Value  Date   WBC 10.1 11/28/2013   HGB 13.8 11/28/2013   HCT 42.2 11/28/2013   MCV 87 11/28/2013   PLT 208 11/28/2013    Lab Results  Component Value Date   CREATININE 1.38* 11/28/2013    No results found for: PSA  No results found for: TESTOSTERONE  No results found for: HGBA1C  Urinalysis No results found for: COLORURINE, APPEARANCEUR, LABSPEC, PHURINE, GLUCOSEU, HGBUR, BILIRUBINUR, KETONESUR, PROTEINUR, UROBILINOGEN, NITRITE, LEUKOCYTESUR  Pertinent Imaging:   Assessment & Plan:    1. Urinary retention:  After discussion with caregiver, it is decided to have the patient undergo a voiding trial.  The Foley catheter is removed and patient will return next week for a PVR. Continue his Flomax.  2. BPH with urinary retention:  Patient is retention and is currently managed with indwelling Foley. We will remove her Foley catheter today and patient will have a voiding trial.  There are no diagnoses linked to this encounter.  No Follow-up on file.  Zara Council, [facility] [address] [place], [place] [address] [phone]

## 2014-07-11 NOTE — Patient Instructions (Signed)
Patient had foley removed today in hopes that he will empty his bladder on his own.  He will RTC next week for a PVR.

## 2014-07-15 ENCOUNTER — Ambulatory Visit (INDEPENDENT_AMBULATORY_CARE_PROVIDER_SITE_OTHER): Payer: Medicare Other | Admitting: Urology

## 2014-07-15 DIAGNOSIS — R350 Frequency of micturition: Secondary | ICD-10-CM | POA: Diagnosis not present

## 2014-07-15 LAB — BLADDER SCAN AMB NON-IMAGING: SCAN RESULT: 170

## 2014-07-15 MED ORDER — GIZMO CONDOM CATHETER MISC: Status: DC

## 2014-07-15 NOTE — Progress Notes (Signed)
Bladder Scan Patient cannot void: 170 ml upon scan Performed By: Toniann Fail, LPN  Caregiver requested intervention for urinary incontinence since foley being removed. Per Larene Beach pt and POA need to make a f/u appt with her. A script for condom catheters was given until pt is able to return for f/u.

## 2014-07-16 ENCOUNTER — Other Ambulatory Visit: Payer: Self-pay | Admitting: Physician Assistant

## 2014-07-16 DIAGNOSIS — I714 Abdominal aortic aneurysm, without rupture, unspecified: Secondary | ICD-10-CM

## 2014-07-16 DIAGNOSIS — Z952 Presence of prosthetic heart valve: Secondary | ICD-10-CM

## 2014-07-18 ENCOUNTER — Ambulatory Visit (INDEPENDENT_AMBULATORY_CARE_PROVIDER_SITE_OTHER): Payer: Medicare Other | Admitting: Urology

## 2014-07-18 ENCOUNTER — Encounter: Payer: Self-pay | Admitting: Urology

## 2014-07-18 VITALS — BP 145/78 | HR 69 | Ht 69.5 in | Wt 196.5 lb

## 2014-07-18 DIAGNOSIS — N401 Enlarged prostate with lower urinary tract symptoms: Secondary | ICD-10-CM | POA: Insufficient documentation

## 2014-07-18 DIAGNOSIS — N390 Urinary tract infection, site not specified: Secondary | ICD-10-CM | POA: Diagnosis not present

## 2014-07-18 DIAGNOSIS — R338 Other retention of urine: Secondary | ICD-10-CM

## 2014-07-18 DIAGNOSIS — R32 Unspecified urinary incontinence: Secondary | ICD-10-CM | POA: Diagnosis not present

## 2014-07-18 DIAGNOSIS — R339 Retention of urine, unspecified: Secondary | ICD-10-CM | POA: Insufficient documentation

## 2014-07-18 LAB — MICROSCOPIC EXAMINATION: WBC, UA: >30 /hpf — AB (ref 0–?)

## 2014-07-18 LAB — URINALYSIS, COMPLETE
BILIRUBIN UA: NEGATIVE
Ketones, UA: NEGATIVE
Nitrite, UA: NEGATIVE
PH UA: 5 (ref 5.0–7.5)
Specific Gravity, UA: 1.015 (ref 1.005–1.030)
Urobilinogen, Ur: 0.2 mg/dL (ref 0.2–1.0)

## 2014-07-18 NOTE — Progress Notes (Signed)
11:43 AM   Bobby Jacobs August 11, 1940 [id number]  Referring provider: Lillard Anes, MD [address] [place], [place] [address]  Chief Complaint  Patient presents with  . Consult    incontinence issues    HPI: Mr. Dehn is a 74 year old white male with a history of urinary retention and urinary incontinence who presents today with his caregiver and his wife for further discussion on how to manage these conditions. Patient did have an incontinent episode in our lobby soaking through his sweat pants through the chair and onto the floor.  The caregiver states that the condom catheters we recommended last week were not  effective. She states she could not get the condom catheter to remain in place and urine leaked everywhere.  Patient has dementia and is unable to recognize when he needs to urinate. He is having overflow incontinence.  We discussed CIC, indwelling Foley and suprapubic tube placement. I described the risks and benefits of each of these treatment modalities. Patient's caregiver and wife will not do CIC as patient is very combative when trying to place a catheter. An indwelling Foley could result in urethral erosion, so it is decided that patient will undergo suprapubic tube placement with serial dilations in interventional radiology.  PMH: Past Medical History  Diagnosis Date  . Hypertension   . Diabetes mellitus, type 2   . Skin cancer   . Coronary artery disease   . Depression   . GERD (gastroesophageal reflux disease)   . Cardiovascular disease   . High cholesterol   . Pneumonia   . Fracture   . Obesity   . Sleep apnea   . CHF (congestive heart failure)   . Dementia   . Atrial fibrillation   . Urinary retention   . Urine frequency   . Incomplete bladder emptying     Surgical History: Past Surgical History  Procedure Laterality Date  . Hernia repair    . Cardiac surgery    . Neck surgery    . Degenerative disk disease    .  Circumcision      Home Medications:    Medication List       This list is accurate as of: 07/18/14 11:43 AM.  Always use your most recent med list.               AMLODIPINE BESYLATE PO  Take 10 mg by mouth.     aspirin 81 MG tablet  Take 81 mg by mouth daily.     atorvastatin 80 MG tablet  Commonly known as:  LIPITOR  Take 80 mg by mouth daily.     calcium carbonate 600 MG Tabs tablet  Commonly known as:  OS-CAL  Take 600 mg by mouth 2 (two) times daily with a meal.     carbamide peroxide 6.5 % otic solution  Commonly known as:  DEBROX  Place 5 drops into both ears 2 (two) times daily as needed.     FIBER 7 Powd  Take 1 scoop by mouth 3 (three) times daily.     gabapentin 300 MG capsule  Commonly known as:  NEURONTIN  Take 300 mg by mouth 3 (three) times daily.     GIZMO CONDOM CATHETER Misc  Change catheter once daily     glucose blood test strip  1 each by Other route as needed for other. Use as instructed     insulin detemir 100 UNIT/ML injection  Commonly known as:  LEVEMIR  Inject into the skin at bedtime.     insulin lispro 100 UNIT/ML injection  Commonly known as:  HUMALOG  Inject into the skin 3 (three) times daily before meals.     HUMALOG KWIKPEN 100 UNIT/ML KiwkPen  Generic drug:  insulin lispro     MAGELLAN INSULIN SAFETY SYR 29G X 1/2" 1 ML Misc  Generic drug:  INSULIN SYRINGE 1CC/29G     Melatonin 5 MG Caps  Take by mouth at bedtime.     metoprolol 50 MG tablet  Commonly known as:  LOPRESSOR  Take 50 mg by mouth 2 (two) times daily.     miconazole 2 % cream  Commonly known as:  MICOTIN  Apply 1 application topically 2 (two) times daily.     NOVOFINE 32G X 6 MM Misc  Generic drug:  Insulin Pen Needle  by Does not apply route.     omeprazole 20 MG capsule  Commonly known as:  PRILOSEC  Take 20 mg by mouth daily.     OMNICEF PO  Take by mouth.     QUEtiapine 50 MG tablet  Commonly known as:  SEROQUEL  Take 50 mg by mouth at  bedtime.     ranitidine 150 MG capsule  Commonly known as:  ZANTAC  Take 150 mg by mouth 2 (two) times daily.     ranitidine 150 MG tablet  Commonly known as:  ZANTAC     tamsulosin 0.4 MG Caps capsule  Commonly known as:  FLOMAX  Take 0.4 mg by mouth.     traZODone 100 MG tablet  Commonly known as:  DESYREL  Take 100 mg by mouth at bedtime.     triamcinolone cream 0.1 %  Commonly known as:  KENALOG     valsartan 80 MG tablet  Commonly known as:  DIOVAN  Take 80 mg by mouth daily.     vitamin B-12 100 MCG tablet  Commonly known as:  CYANOCOBALAMIN  Take 100 mcg by mouth daily.     warfarin 7.5 MG tablet  Commonly known as:  COUMADIN  Take 7.5 mg by mouth daily.        Allergies: No Known Allergies  Family History: Family History  Problem Relation Age of Onset  . Cancer Neg Hx     Kidney,Bladder ,Prostate    Social History:  reports that he has quit smoking. He has never used smokeless tobacco. He reports that he does not drink alcohol or use illicit drugs.  ROS: Urological Symptom Review  Patient is experiencing the following symptoms: Frequent urination Hard to postpone urination Get up at night to urinate Leakage of urine   Review of Systems  Gastrointestinal (upper)  : Negative for upper GI symptoms  Gastrointestinal (lower) : Constipation  Constitutional : Negative for symptoms  Skin: Negative for skin symptoms  Eyes: Negative for eye symptoms  Ear/Nose/Throat : Negative for Ear/Nose/Throat symptoms  Hematologic/Lymphatic: Negative for Hematologic/Lymphatic symptoms  Cardiovascular : Negative for cardiovascular symptoms  Respiratory : Shortness of breath  Endocrine: Excessive thirst  Musculoskeletal: Back pain  Neurological: Negative for neurological symptoms  Psychologic: Negative for psychiatric symptoms   Physical Exam: BP 145/78 mmHg  Pulse 69  Ht 5' 9.5" (1.765 m)  Wt 196 lb 8 oz (89.132 kg)  BMI 28.61  kg/m2   Laboratory Data: Results for orders placed or performed in visit on 07/18/14  CULTURE, URINE COMPREHENSIVE  Result Value Ref Range   Result 1 Proteus mirabilis (A)  Result 2 Gram negative rods (A)    ANTIMICROBIAL SUSCEPTIBILITY Comment   Microscopic Examination  Result Value Ref Range   WBC, UA >30 (A) 0 -  5 /hpf   RBC, UA 3-10 (A) 0 -  2 /hpf   Epithelial Cells (non renal) 0-10 0 - 10 /hpf   Bacteria, UA Few None seen/Few  Urinalysis, Complete  Result Value Ref Range   Specific Gravity, UA 1.015 1.005 - 1.030   pH, UA 5.0 5.0 - 7.5   Color, UA Yellow Yellow   Appearance Ur Cloudy (A) Clear   Leukocytes, UA 3+ (A) Negative   Protein, UA 1+ (A) Negative/Trace   Glucose, UA 1+ (A) Negative   Ketones, UA Negative Negative   RBC, UA 2+ (A) Negative   Bilirubin, UA Negative Negative   Urobilinogen, Ur 0.2 0.2 - 1.0 mg/dL   Nitrite, UA Negative Negative   Microscopic Examination See below:    Lab Results  Component Value Date   WBC 10.1 11/28/2013   HGB 13.8 11/28/2013   HCT 42.2 11/28/2013   MCV 87 11/28/2013   PLT 208 11/28/2013    Lab Results  Component Value Date   CREATININE 1.38* 11/28/2013    No results found for: PSA  No results found for: TESTOSTERONE  No results found for: HGBA1C  Urinalysis No results found for: COLORURINE, APPEARANCEUR, LABSPEC, PHURINE, GLUCOSEU, HGBUR, BILIRUBINUR, KETONESUR, PROTEINUR, UROBILINOGEN, NITRITE, LEUKOCYTESUR  Pertinent Imaging: Procedure: Simple Catheter Placement  Due to urinary retention patient is present today for a foley cath placement.  Patient was cleaned and prepped in a sterile fashion with betadine and lidocaine jelly 2% was instilled into the urethra.  A 18 FR foley catheter was inserted, urine return was noted  120 ml, urine was yellow, cloudy in color.  The balloon was filled with 10cc of sterile water.  A leg bag was attached for drainage. Patient was also given a night bag to take home and was  given instruction on how to change from one bag to another.  Patient was given instruction on proper catheter care.  Patient tolerated well, no complications were noted   Preformed by: Zara Council, P.A-C  Additional notes/ Follow up:   Assessment & Plan:    1. Urinary retention:  After a discussion with caregiver and wife, it is decided that patient would best benefit from a suprapubic tube placement. He will be scheduled in interventional radiology for placement of a suprapubic tube was serial dilations. He'll have a indwelling Foley in place until that time.  2. Urinary incontinence:  Also likely due to overflow incontinence. We will manage with SPT placement.  3. UTI:   Patient's urine is very cloudy and UA notes greater than 30 WBCs and 3-10 RBCs per high-power field. We will send a cath specimen for culture.  We will await culture results and sensitivities before prescribing an antibiotic.  There are no diagnoses linked to this encounter.  No Follow-up on file.  Zara Council, [facility] [address] [place], [place] [address] [phone]

## 2014-07-21 ENCOUNTER — Ambulatory Visit: Payer: Medicare Other

## 2014-07-21 ENCOUNTER — Telehealth: Payer: Self-pay | Admitting: Urology

## 2014-07-21 DIAGNOSIS — R32 Unspecified urinary incontinence: Secondary | ICD-10-CM | POA: Insufficient documentation

## 2014-07-21 DIAGNOSIS — N390 Urinary tract infection, site not specified: Secondary | ICD-10-CM | POA: Insufficient documentation

## 2014-07-21 LAB — CULTURE, URINE COMPREHENSIVE

## 2014-07-21 NOTE — Telephone Encounter (Signed)
Spoke w/Susan patient's caregiver to notify her that per [place] in [place] patient has been scheduled for SP Tube placement on 07-31-14@8am . I saw that patient is taking Coumadin and ASA per [place] the SP Tube placement can be done on ASA but will need to stop coumadin 4 days prior to procedure. I asked Manuela Schwartz who the patient's cardiologist would be so that I could contact them to get clearance to ok the stopping of coumadin. She informed me that she was not sure if he had a cardiologist, but his PCP Dr. Leta Baptist had ordered a EKO and this was being done tomorrow. I informed her that I would contact their office to see if they were managing his coumadin and if they would provide clearance. I called and spoke with Jocelyn Lamer at Dr. Theodosia Blender office and left a message for his nurse to call back in regards to this and I also faxed a clearance note. [phone]. Awaiting their call to proceed.

## 2014-07-22 ENCOUNTER — Ambulatory Visit
Admission: RE | Admit: 2014-07-22 | Discharge: 2014-07-22 | Disposition: A | Discharge status: Home / Self Care | Payer: Medicare Other | Source: Ambulatory Visit | Attending: Physician Assistant | Admitting: Physician Assistant

## 2014-07-22 ENCOUNTER — Ambulatory Visit: Payer: Medicare Other

## 2014-07-22 ENCOUNTER — Telehealth: Payer: Self-pay

## 2014-07-22 DIAGNOSIS — I714 Abdominal aortic aneurysm, without rupture, unspecified: Secondary | ICD-10-CM

## 2014-07-22 DIAGNOSIS — N390 Urinary tract infection, site not specified: Secondary | ICD-10-CM

## 2014-07-22 DIAGNOSIS — I059 Rheumatic mitral valve disease, unspecified: Secondary | ICD-10-CM | POA: Insufficient documentation

## 2014-07-22 DIAGNOSIS — I340 Nonrheumatic mitral (valve) insufficiency: Secondary | ICD-10-CM | POA: Diagnosis not present

## 2014-07-22 DIAGNOSIS — I350 Nonrheumatic aortic (valve) stenosis: Secondary | ICD-10-CM | POA: Diagnosis not present

## 2014-07-22 DIAGNOSIS — I071 Rheumatic tricuspid insufficiency: Secondary | ICD-10-CM | POA: Diagnosis not present

## 2014-07-22 DIAGNOSIS — Z952 Presence of prosthetic heart valve: Secondary | ICD-10-CM

## 2014-07-22 MED ORDER — AMOXICILLIN-POT CLAVULANATE 875-125 MG PO TABS: 1.0000 | ORAL_TABLET | Freq: Two times a day (BID) | ORAL | Status: AC

## 2014-07-22 NOTE — Telephone Encounter (Signed)
-----   Message from Nori Riis, PA-C sent at 07/21/2014  7:57 PM EDT ----- Patient needs to start Augmentin 875/125 mg one tablet twice daily for seven days.

## 2014-07-22 NOTE — Telephone Encounter (Signed)
Spoke with Manuela Schwartz, pt caregiver, in reference to infection. Made aware abt was called into pharmacy. Manuela Schwartz voiced understanding. Cw,lpn

## 2014-07-23 ENCOUNTER — Other Ambulatory Visit: Payer: Self-pay | Admitting: Urology

## 2014-07-23 DIAGNOSIS — R339 Retention of urine, unspecified: Secondary | ICD-10-CM

## 2014-07-23 NOTE — Telephone Encounter (Signed)
Spoke w/Vicki again from Dr. Theodos[facility] Blender office to find out status on wither or not he would ok patient to stop coumadin. She states that she thought he was having surgery and set him up for an apt on Tuesday for clearance, I clarified that patient was not having surgery he was having a SP Tube placed in specials and just needed to stop his coumadin 4 days prior. She asked for [place] to fax the note over for Dr. Radford Pax to sign and expects to call us back later today with the ok.

## 2014-07-24 ENCOUNTER — Other Ambulatory Visit: Payer: Self-pay | Admitting: Vascular Surgery

## 2014-07-24 DIAGNOSIS — I714 Abdominal aortic aneurysm, without rupture, unspecified: Secondary | ICD-10-CM

## 2014-07-31 ENCOUNTER — Ambulatory Visit: Admission: RE | Admit: 2014-07-31 | Payer: No Typology Code available for payment source | Source: Ambulatory Visit

## 2014-07-31 NOTE — Telephone Encounter (Signed)
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 SP tube placement was cancelled with Specials. They would like to coordinate with our office and Vascular to have the repair and SP tube placed at the same time. I spoke with Dr. Erlene Quan and she is fine with doing the SP tube in the OR if Vascular is ok with this, per Manuela Schwartz they are. Patient is having a CTscan done and being set up to see Dr. Laurelyn Sickle at [facility] for a Cardio evaluation. Manuela Schwartz will call back when they follow up with Dr. Henrine Screws office at vein and vascular and will have their office to contact us to coordinate surgery date and time. I explained that we would need a UA and CX done 1 wk prior to surgery to make sure there is no infection present. Patient has a foley cath and it will need to be plugged to get the sample, patient's caregiver understands.

## 2014-08-04 ENCOUNTER — Ambulatory Visit
Admission: RE | Admit: 2014-08-04 | Discharge: 2014-08-04 | Disposition: A | Discharge status: Home / Self Care | Payer: Medicare Other | Source: Ambulatory Visit | Attending: Vascular Surgery | Admitting: Vascular Surgery

## 2014-08-04 DIAGNOSIS — K4090 Unilateral inguinal hernia, without obstruction or gangrene, not specified as recurrent: Secondary | ICD-10-CM | POA: Diagnosis present

## 2014-08-04 DIAGNOSIS — K5730 Diverticulosis of large intestine without perforation or abscess without bleeding: Secondary | ICD-10-CM | POA: Insufficient documentation

## 2014-08-04 DIAGNOSIS — I714 Abdominal aortic aneurysm, without rupture, unspecified: Secondary | ICD-10-CM

## 2014-08-04 DIAGNOSIS — I2510 Atherosclerotic heart disease of native coronary artery without angina pectoris: Secondary | ICD-10-CM | POA: Insufficient documentation

## 2014-08-04 MED ORDER — IOHEXOL 350 MG/ML SOLN
100.0000 mL | Freq: Once | INTRAVENOUS | Status: AC | PRN
  Administered 2014-08-04: 75 mL via INTRAVENOUS

## 2014-08-12 ENCOUNTER — Telehealth: Payer: Self-pay

## 2014-08-12 NOTE — Telephone Encounter (Signed)
Care giver Arlie Solomons called and stated pt pulled out overnight cath and wanted to know if she needs any special instructions.  It was bleeding, but stopped and now is minimal.  Please give her a call at either [phone] or [phone]

## 2014-08-25 ENCOUNTER — Telehealth: Payer: Self-pay | Admitting: Urology

## 2014-08-25 DIAGNOSIS — N390 Urinary tract infection, site not specified: Secondary | ICD-10-CM

## 2014-08-25 NOTE — Telephone Encounter (Signed)
I spoke w/ Manuela Schwartz about the pt clearance for SP tube. She informed me that Dr. Ronalee Belts decided that the pt didn't need AAA surgery. She also informed me that the pt was seen by Dr. Humphrey Rolls at [facility] for Cardiac Clearance. I contacted Dr. Humphrey Rolls office and spoke w/ Varney Biles. She said Dr. Humphrey Rolls stated in his note that he only seen the pt once and he consider him low-moderate risk for surgery. I asked her to get him to specify if the pt could stop his Coumadin for 4 days x 3 times once a month for three months. She said she will discuss this with him and get back with me.

## 2014-08-27 NOTE — Telephone Encounter (Signed)
Just wanted to make sure this has been taken care of.

## 2014-08-28 NOTE — Telephone Encounter (Signed)
I also called [facility] and spoke w/ a nurse who informed me that the clearance was submitted back to our office on Tuesday, August 2nd.

## 2014-08-28 NOTE — Telephone Encounter (Signed)
I received a phone call back from [place] at [facility]) she informed me that the clearance was approved and faxed over for the SP tube placement and for him to come off the coumadin 4 days prior.

## 2014-08-28 NOTE — Telephone Encounter (Signed)
I called back and left a message on pt Bobby Jacobs vm Probation officer) to return my call concerning the Cardiac Clearance for SP tube Placement.

## 2014-09-08 ENCOUNTER — Telehealth: Payer: Self-pay | Admitting: Urology

## 2014-09-08 ENCOUNTER — Encounter: Payer: Self-pay | Admitting: Urology

## 2014-09-08 ENCOUNTER — Ambulatory Visit (INDEPENDENT_AMBULATORY_CARE_PROVIDER_SITE_OTHER): Payer: Medicare Other | Admitting: Urology

## 2014-09-08 VITALS — BP 135/69 | HR 67 | Ht 69.0 in | Wt 197.1 lb

## 2014-09-08 DIAGNOSIS — R32 Unspecified urinary incontinence: Secondary | ICD-10-CM

## 2014-09-08 DIAGNOSIS — R339 Retention of urine, unspecified: Secondary | ICD-10-CM | POA: Diagnosis not present

## 2014-09-08 NOTE — Telephone Encounter (Signed)
Spoke with Bobby Jacobs patient's caregiver and notified her that we did receive the clearance for patient to stop coumadin 4 days prior to [facility] tube placement for the 3x it will be done in specials. I spoke with [place] in specials and got patient scheduled for 09-22-14@8am . Bobby Jacobs was given this date and time and instructed to stop patient's coumadin 4 days prior and to be NPO from midnight the night before. Patient will need an updated H&P every 30days for specials, an apt was made with shannon on 10-13-14@10am  to do this and Bobby Jacobs was given that date and time as well.

## 2014-09-08 NOTE — Progress Notes (Signed)
11:16 AM   Bobby Jacobs [birth year] [id number]  Referring provider: [name], PA [address] [place], [place] [address]  Chief Complaint  Patient presents with  . Advice Only    placing sp tube    HPI: Bobby Jacobs is a 74 year old white male with a history of urinary retention and urinary incontinence who presents today to discuss SPT placement.  We have received the clearance for him to stop his Coumadin for four days and we can proceed with the procedure.  He will have this performed under CT guidance in IR.    He has recently traumatically pulled his foley about four weeks ago.  He did have gross hematuria afterwards for a few minute.  A foley was replaced by [facility] without any difficulty.  He still has an indwelling foley at this time.  Patient has dementia and is unable to recognize when he needs to urinate. He is having overflow incontinence.  We did discuss CIC and indwelling Foley.   I described the risks and benefits of each of these treatment modalities. Patient's caregiver and wife will not do CIC as patient is very combative when trying to place a catheter. An indwelling Foley could result in urethral erosion, so it is decided that patient will undergo suprapubic tube placement with serial dilations in interventional radiology.  PMH: Past Medical History  Diagnosis Date  . Hypertension   . Skin cancer   . Coronary artery disease   . Depression   . GERD (gastroesophageal reflux disease)   . Cardiovascular disease   . High cholesterol   . Pneumonia   . Fracture   . Obesity   . Sleep apnea   . CHF (congestive heart failure)   . Dementia   . Atrial fibrillation   . Urinary retention   . Urine frequency   . Incomplete bladder emptying   . Diabetes mellitus, type 2     Surgical History: Past Surgical History  Procedure Laterality Date  . Hernia repair    . Cardiac surgery    . Neck surgery    . Degenerative disk  disease    . Circumcision      Home Medications:    Medication List       This list is accurate as of: 09/08/14 11:16 AM.  Always use your most recent med list.               AMLODIPINE BESYLATE PO  Take 10 mg by mouth.     aspirin 81 MG tablet  Take 81 mg by mouth daily.     atorvastatin 80 MG tablet  Commonly known as:  LIPITOR  Take 80 mg by mouth daily.     calcium carbonate 600 MG Tabs tablet  Commonly known as:  OS-CAL  Take 600 mg by mouth 2 (two) times daily with a meal.     carbamide peroxide 6.5 % otic solution  Commonly known as:  DEBROX  Place 5 drops into both ears 2 (two) times daily as needed.     FIBER 7 Powd  Take 1 scoop by mouth 3 (three) times daily.     gabapentin 300 MG capsule  Commonly known as:  NEURONTIN  Take 300 mg by mouth 3 (three) times daily.     glucose blood test strip  1 each by Other route as needed for other. Use as instructed     insulin detemir 100 UNIT/ML injection  Commonly known as:  LEVEMIR  Inject into the skin at bedtime.     insulin lispro 100 UNIT/ML injection  Commonly known as:  HUMALOG  Inject into the skin 3 (three) times daily before meals.     HUMALOG KWIKPEN 100 UNIT/ML KiwkPen  Generic drug:  insulin lispro     losartan 100 MG tablet  Commonly known as:  COZAAR     MAGELLAN INSULIN SAFETY SYR 29G X 1/2" 1 ML Misc  Generic drug:  INSULIN SYRINGE 1CC/29G     Melatonin 5 MG Caps  Take by mouth at bedtime.     metoprolol 50 MG tablet  Commonly known as:  LOPRESSOR  Take 50 mg by mouth 2 (two) times daily.     miconazole 2 % cream  Commonly known as:  MICOTIN  Apply 1 application topically 2 (two) times daily.     NOVOFINE 32G X 6 MM Misc  Generic drug:  Insulin Pen Needle  by Does not apply route.     omeprazole 20 MG capsule  Commonly known as:  PRILOSEC  Take 20 mg by mouth daily.     OMNICEF PO  Take by mouth.     QUEtiapine 50 MG tablet  Commonly known as:  SEROQUEL  Take 50 mg by  mouth at bedtime.     ranitidine 150 MG capsule  Commonly known as:  ZANTAC  Take 150 mg by mouth 2 (two) times daily.     ranitidine 150 MG tablet  Commonly known as:  ZANTAC     tamsulosin 0.4 MG Caps capsule  Commonly known as:  FLOMAX  Take 0.4 mg by mouth.     traZODone 100 MG tablet  Commonly known as:  DESYREL  Take 150 mg by mouth at bedtime.     triamcinolone cream 0.1 %  Commonly known as:  KENALOG     valsartan 80 MG tablet  Commonly known as:  DIOVAN  Take 80 mg by mouth daily.     VISION-VITE PRESERVE PO  Take by mouth.     vitamin B-12 100 MCG tablet  Commonly known as:  CYANOCOBALAMIN  Take 100 mcg by mouth daily.     warfarin 7.5 MG tablet  Commonly known as:  COUMADIN  Take 7.5 mg by mouth daily.        Allergies: No Known Allergies  Family History: Family History  Problem Relation Age of Onset  . Cancer Neg Hx     Kidney,Bladder ,Prostate    Social History:  reports that he has quit smoking. He has never used smokeless tobacco. He reports that he does not drink alcohol or use illicit drugs.  ROS: Urological Symptom Review  Patient is experiencing the following symptoms: Frequent urination Hard to postpone urination Get up at night to urinate Leakage of urine   Review of Systems  Gastrointestinal (upper)  : Negative for upper GI symptoms  Gastrointestinal (lower) : Constipation  Constitutional : Negative for symptoms  Skin: Negative for skin symptoms  Eyes: Negative for eye symptoms  Ear/Nose/Throat : Negative for Ear/Nose/Throat symptoms  Hematologic/Lymphatic: Negative for Hematologic/Lymphatic symptoms  Cardiovascular : Negative for cardiovascular symptoms  Respiratory : Shortness of breath  Endocrine: Excessive thirst  Musculoskeletal: Back pain  Neurological: Negative for neurological symptoms  Psychologic: Negative for psychiatric symptoms   Physical Exam: BP 135/69 mmHg  Pulse 67  Ht 5\' 9"   (1.753 m)  Wt 197 lb 1.6 oz (89.404 kg)  BMI 29.09 kg/m2  Constitutional: Alert and oriented. Well appearing and in no acute distress. Eyes: Conjunctivae are normal. PERRL. EOMI. Head: Atraumatic. Nose: No congestion/rhinorrhea. Mouth/Throat: Mucous membranes are moist. Oropharynx non-erythematous. Neck: No stridor.  Cardiovascular: Normal rate, regular rhythm. Grossly normal heart sounds. Good peripheral circulation. Respiratory: Normal respiratory effort. No retractions. Lungs CTAB. Gastrointestinal: Soft with faint LLQ tenderness, no rebound or guarding. No distention. No abdominal bruits. No CVA tenderness. Genitourinary: deferred Musculoskeletal: No lower extremity tenderness nor edema. No joint effusions. Neurologic: Normal speech and language. No gross focal neurologic deficits are appreciated. Speech is normal. No gait instability. Skin: Skin is warm, dry and intact. No rash noted. Psychiatric: Patient suffers with dementia.   Laboratory Data:  Lab Results  Component Value Date   WBC 10.1 11/28/2013   HGB 13.8 11/28/2013   HCT 42.2 11/28/2013   MCV 87 11/28/2013   PLT 208 11/28/2013    Lab Results  Component Value Date   CREATININE 1.38* 11/28/2013    No results found for: PSA  No results found for: TESTOSTERONE  No results found for: HGBA1C  Urinalysis    Component Value Date/Time   COLORURINE Yellow 11/28/2013 1602   APPEARANCEUR Clear 11/28/2013 1602   LABSPEC 1.015 11/28/2013 1602   PHURINE 5.0 11/28/2013 1602   GLUCOSEU 1+* 07/18/2014 1239   GLUCOSEU Negative 11/28/2013 1602   HGBUR 2+ 11/28/2013 1602   BILIRUBINUR Negative 07/18/2014 1239   BILIRUBINUR Negative 11/28/2013 1602   KETONESUR Negative 11/28/2013 1602   PROTEINUR Negative 11/28/2013 1602   NITRITE Negative 07/18/2014 1239   NITRITE Negative 11/28/2013 1602   LEUKOCYTESUR 3+* 07/18/2014 1239   LEUKOCYTESUR Negative 11/28/2013 1602     Assessment & Plan:    1. Urinary  retention:  After a discussion with caregiver and wife, it is decided that patient would best benefit from a suprapubic tube placement. He will be scheduled in interventional radiology for placement of a suprapubic tube was serial dilations. He'll have a indwelling Foley in place until that time.  Coumadin to be held for four days prior to the procedure.    2. Urinary incontinence:  Also likely due to overflow incontinence. We will manage with SPT placement.  There are no diagnoses linked to this encounter.  No Follow-up on file.  Zara Council, [facility] [address] [place], [place] [address] [phone]

## 2014-09-15 ENCOUNTER — Ambulatory Visit: Payer: Medicare Other

## 2014-09-15 DIAGNOSIS — R339 Retention of urine, unspecified: Secondary | ICD-10-CM

## 2014-09-16 LAB — MICROSCOPIC EXAMINATION
Epithelial Cells (non renal): NONE SEEN /hpf (ref 0–10)
RBC, UA: NONE SEEN /hpf (ref 0–?)

## 2014-09-16 LAB — URINALYSIS, COMPLETE
BILIRUBIN UA: NEGATIVE
GLUCOSE, UA: NEGATIVE
Ketones, UA: NEGATIVE
Nitrite, UA: NEGATIVE
PH UA: 7 (ref 5.0–7.5)
PROTEIN UA: NEGATIVE
Specific Gravity, UA: 1.01 (ref 1.005–1.030)
UUROB: 0.2 mg/dL (ref 0.2–1.0)

## 2014-09-18 LAB — CULTURE, URINE COMPREHENSIVE

## 2014-09-18 MED ORDER — AMOXICILLIN-POT CLAVULANATE 875-125 MG PO TABS: 1.0000 | ORAL_TABLET | Freq: Two times a day (BID) | ORAL | Status: AC

## 2014-09-18 NOTE — Telephone Encounter (Signed)
-----   Message from Nori Riis, PA-C sent at 09/17/2014  8:34 PM EDT ----- Patient has a +UCx.  They need to start Augmentin 875/125  one  twice daily for seven days and then we need to check a CATH specimen in 3 to 5 days after they complete their antibiotics.

## 2014-09-18 NOTE — Telephone Encounter (Signed)
Spoke with pt caregiver, Manuela Schwartz, and made aware of infection. Manuela Schwartz voiced understanding. Medication called into pharmacy. Pt will return 10/02/14 for cath specimen.

## 2014-09-19 ENCOUNTER — Other Ambulatory Visit: Payer: Self-pay | Admitting: Radiology

## 2014-09-19 ENCOUNTER — Telehealth: Payer: Self-pay | Admitting: Physician Assistant

## 2014-09-22 ENCOUNTER — Telehealth: Payer: Self-pay | Admitting: Urology

## 2014-09-22 ENCOUNTER — Other Ambulatory Visit: Payer: Self-pay | Admitting: Urology

## 2014-09-22 ENCOUNTER — Ambulatory Visit
Admission: RE | Admit: 2014-09-22 | Discharge: 2014-09-22 | Disposition: A | Discharge status: Home / Self Care | Payer: Medicare Other | Source: Ambulatory Visit | Attending: Urology | Admitting: Urology

## 2014-09-22 DIAGNOSIS — G4730 Sleep apnea, unspecified: Secondary | ICD-10-CM | POA: Diagnosis not present

## 2014-09-22 DIAGNOSIS — I509 Heart failure, unspecified: Secondary | ICD-10-CM | POA: Insufficient documentation

## 2014-09-22 DIAGNOSIS — Z8673 Personal history of transient ischemic attack (TIA), and cerebral infarction without residual deficits: Secondary | ICD-10-CM | POA: Insufficient documentation

## 2014-09-22 DIAGNOSIS — I10 Essential (primary) hypertension: Secondary | ICD-10-CM | POA: Diagnosis not present

## 2014-09-22 DIAGNOSIS — F0390 Unspecified dementia without behavioral disturbance: Secondary | ICD-10-CM | POA: Diagnosis not present

## 2014-09-22 DIAGNOSIS — E119 Type 2 diabetes mellitus without complications: Secondary | ICD-10-CM | POA: Diagnosis not present

## 2014-09-22 DIAGNOSIS — Z794 Long term (current) use of insulin: Secondary | ICD-10-CM | POA: Diagnosis not present

## 2014-09-22 DIAGNOSIS — F329 Major depressive disorder, single episode, unspecified: Secondary | ICD-10-CM | POA: Insufficient documentation

## 2014-09-22 DIAGNOSIS — E669 Obesity, unspecified: Secondary | ICD-10-CM | POA: Insufficient documentation

## 2014-09-22 DIAGNOSIS — Z79899 Other long term (current) drug therapy: Secondary | ICD-10-CM | POA: Diagnosis not present

## 2014-09-22 DIAGNOSIS — Z85828 Personal history of other malignant neoplasm of skin: Secondary | ICD-10-CM | POA: Diagnosis not present

## 2014-09-22 DIAGNOSIS — Z87891 Personal history of nicotine dependence: Secondary | ICD-10-CM | POA: Diagnosis not present

## 2014-09-22 DIAGNOSIS — R339 Retention of urine, unspecified: Secondary | ICD-10-CM | POA: Diagnosis present

## 2014-09-22 DIAGNOSIS — I4891 Unspecified atrial fibrillation: Secondary | ICD-10-CM | POA: Insufficient documentation

## 2014-09-22 DIAGNOSIS — Z7901 Long term (current) use of anticoagulants: Secondary | ICD-10-CM | POA: Insufficient documentation

## 2014-09-22 DIAGNOSIS — E780 Pure hypercholesterolemia: Secondary | ICD-10-CM | POA: Insufficient documentation

## 2014-09-22 DIAGNOSIS — Z6829 Body mass index (BMI) 29.0-29.9, adult: Secondary | ICD-10-CM | POA: Diagnosis not present

## 2014-09-22 DIAGNOSIS — I2510 Atherosclerotic heart disease of native coronary artery without angina pectoris: Secondary | ICD-10-CM | POA: Insufficient documentation

## 2014-09-22 DIAGNOSIS — K219 Gastro-esophageal reflux disease without esophagitis: Secondary | ICD-10-CM | POA: Diagnosis not present

## 2014-09-22 HISTORY — DX: Peripheral vascular disease, unspecified: I73.9

## 2014-09-22 HISTORY — DX: Cerebral infarction, unspecified: I63.9

## 2014-09-22 HISTORY — DX: Aortic aneurysm of unspecified site, without rupture: I71.9

## 2014-09-22 HISTORY — DX: Presence of prosthetic heart valve: Z95.2

## 2014-09-22 HISTORY — DX: Thyrotoxicosis, unspecified without thyrotoxic crisis or storm: E05.90

## 2014-09-22 HISTORY — PX: OTHER SURGICAL HISTORY: SHX169

## 2014-09-22 LAB — PROTIME-INR
INR: 1.19
Prothrombin Time: 15.3 seconds — ABNORMAL HIGH (ref 11.4–15.0)

## 2014-09-22 LAB — CBC
HEMATOCRIT: 40.4 % (ref 40.0–52.0)
HEMOGLOBIN: 13.4 g/dL (ref 13.0–18.0)
MCH: 29.2 pg (ref 26.0–34.0)
MCHC: 33.1 g/dL (ref 32.0–36.0)
MCV: 88.3 fL (ref 80.0–100.0)
Platelets: 180 10*3/uL (ref 150–440)
RBC: 4.57 MIL/uL (ref 4.40–5.90)
RDW: 15.2 % — AB (ref 11.5–14.5)
WBC: 10 10*3/uL (ref 3.8–10.6)

## 2014-09-22 LAB — APTT: APTT: 29 s (ref 24–36)

## 2014-09-22 MED ORDER — CEFAZOLIN SODIUM-DEXTROSE 2-3 GM-% IV SOLR
2.0000 g | Freq: Once | INTRAVENOUS | Status: AC
  Administered 2014-09-22: 2 g via INTRAVENOUS
  Filled 2014-09-22: qty 50

## 2014-09-22 MED ORDER — FENTANYL CITRATE (PF) 100 MCG/2ML IJ SOLN
INTRAMUSCULAR | Status: AC | PRN
  Administered 2014-09-22 (×2): 25 ug via INTRAVENOUS

## 2014-09-22 MED ORDER — SODIUM CHLORIDE 0.9 % IV SOLN
INTRAVENOUS | Status: DC
  Administered 2014-09-22: 09:00:00 via INTRAVENOUS

## 2014-09-22 MED ORDER — MIDAZOLAM HCL 5 MG/5ML IJ SOLN
INTRAMUSCULAR | Status: AC | PRN
  Administered 2014-09-22 (×2): 0.5 mg via INTRAVENOUS

## 2014-09-22 NOTE — Telephone Encounter (Signed)
Juanita w/[facility] [phone] called to make you aware of pt's next appt 10/14 @ 9 for suprapubic cath exchange

## 2014-09-22 NOTE — Consult Note (Signed)
Chief Complaint: Patient was seen in consultation today for urinary retention at the request of Trilby A  Referring Physician(s): McGowan,Shannon A  History of Present Illness: Bobby Jacobs is a 74 y.o. male with past mental history significant for dementia, coronary artery disease, hyperlipidemia, congestive heart failure, atrial fibrillation (on Coumadin) and urinary retention who has had a chronic indwelling urinary catheter for some time.  The patient is accompanied by his wife (who is the healthcare power of attorney) as well as his caregiver.    The patient has been referred by [facility] for image guided placement of a suprapubic catheter in hopes of removing the patient's current indwelling Foley catheter to decrease the chance of urethral injury in the setting of a chronic Foley catheter. He has been off Coumadin for the past 5 days.  The patient has had no interval change and health status since last being seen at the urology clinic. The indwelling Foley remains in place. No hematuria. No flank pain. No fever or chills.  Past Medical History  Diagnosis Date  . Hypertension   . Skin cancer   . Coronary artery disease   . Depression   . GERD (gastroesophageal reflux disease)   . Cardiovascular disease   . High cholesterol   . Pneumonia   . Fracture   . Obesity   . Sleep apnea   . CHF (congestive heart failure)   . Dementia   . Atrial fibrillation   . Urinary retention   . Urine frequency   . Incomplete bladder emptying   . Diabetes mellitus, type 2   . Stroke   . Peripheral vascular disease   . Aortic aneurysm   . Aortic valve replaced   . Hyperthyroidism     Past Surgical History  Procedure Laterality Date  . Hernia repair    . Cardiac surgery    . Neck surgery    . Degenerative disk disease    . Circumcision    . Coronary artery bypass graft      Allergies: Review of patient's allergies indicates no known  allergies.  Medications: Prior to Admission medications   Medication Sig Start Date End Date Taking? Authorizing Provider  AMLODIPINE BESYLATE PO Take 10 mg by mouth.   Yes Historical Provider, MD  amoxicillin-clavulanate (AUGMENTIN) 875-125 MG per tablet Take 1 tablet by mouth 2 (two) times daily. 09/18/14 09/25/14 Yes Shannon A McGowan, PA-C  atorvastatin (LIPITOR) 80 MG tablet Take 80 mg by mouth daily.   Yes Historical Provider, MD  carbamide peroxide (DEBROX) 6.5 % otic solution Place 5 drops into both ears 2 (two) times daily as needed.   Yes Historical Provider, MD  gabapentin (NEURONTIN) 300 MG capsule Take 300 mg by mouth 3 (three) times daily.   Yes Historical Provider, MD  glucose blood test strip 1 each by Other route as needed for other. Use as instructed   Yes Historical Provider, MD  HUMALOG KWIKPEN 100 UNIT/ML KiwkPen  05/29/14  Yes Historical Provider, MD  insulin detemir (LEVEMIR) 100 UNIT/ML injection Inject into the skin at bedtime.   Yes Historical Provider, MD  insulin lispro (HUMALOG) 100 UNIT/ML injection Inject into the skin 3 (three) times daily before meals.   Yes Historical Provider, MD  Insulin Pen Needle (NOVOFINE) 32G X 6 MM MISC by Does not apply route.   Yes Historical Provider, MD  losartan (COZAAR) 100 MG tablet  07/19/14  Yes Historical Provider, MD  [place]  SYR 29G X 1/2" 1 ML MISC  06/10/14  Yes Historical Provider, MD  metoprolol (LOPRESSOR) 50 MG tablet Take 50 mg by mouth 2 (two) times daily.   Yes Historical Provider, MD  QUEtiapine (SEROQUEL) 50 MG tablet Take 50 mg by mouth at bedtime.   Yes Historical Provider, MD  ranitidine (ZANTAC) 150 MG tablet  06/19/14  Yes Historical Provider, MD  traZODone (DESYREL) 100 MG tablet Take 150 mg by mouth at bedtime.    Yes Historical Provider, MD  vitamin B-12 (CYANOCOBALAMIN) 100 MCG tablet Take 100 mcg by mouth daily.   Yes Historical Provider, MD  Misc Natural Products (FIBER 7) POWD Take 1 scoop by  mouth 3 (three) times daily.    Historical Provider, MD  Multiple Vitamins-Minerals (VISION-VITE PRESERVE PO) Take by mouth.    Historical Provider, MD  ranitidine (ZANTAC) 150 MG capsule Take 150 mg by mouth 2 (two) times daily.    Historical Provider, MD  triamcinolone cream (KENALOG) 0.1 %  05/02/14   Historical Provider, MD  warfarin (COUMADIN) 7.5 MG tablet Take 7.5 mg by mouth daily.    Historical Provider, MD     Family History  Problem Relation Age of Onset  . Cancer Neg Hx     Kidney,Bladder ,Prostate    Social History   Social History  . Marital Status: Married    Spouse Name: N/A  . Number of Children: N/A  . Years of Education: N/A   Social History Main Topics  . Smoking status: Former Research scientist (life sciences)  . Smokeless tobacco: Former Systems developer    Quit date: 09/21/2012     Comment: quit one year ago  . Alcohol Use: No  . Drug Use: No  . Sexual Activity: Not Asked   Other Topics Concern  . None   Social History Narrative   Married with 2 children   Right handed   college   3-4 cups daily    ECOG Status: 2 - Symptomatic, <50% confined to bed  Review of Systems: A 12 point ROS discussed and pertinent positives are indicated in the HPI above.  All other systems are negative.  Review of Systems  Constitutional: Negative for chills, activity change and fatigue.  Respiratory: Negative.   Cardiovascular: Negative.   Genitourinary: Negative for frequency and hematuria.    Vital Signs: BP 147/80 mmHg  Pulse 72  Temp(Src) 98 F (36.7 C) (Oral)  Resp 12  Ht 5' 9.5" (1.765 m)  Wt 200 lb (90.719 kg)  BMI 29.12 kg/m2  SpO2 98%  Physical Exam  Constitutional: He appears well-developed and well-nourished.  HENT:  Head: Normocephalic and atraumatic.  Cardiovascular:  Murmur heard. Irregular rhythm.  Pulmonary/Chest: Effort normal and breath sounds normal.  Abdominal: He exhibits no distension. There is no tenderness.  Genitourinary:  Indwelling catheter in place.    Nursing note and vitals reviewed.   Mallampati Score:     Imaging: No results found.  Labs:  CBC:  Recent Labs  11/28/13 1602 09/22/14 0833  WBC 10.1 10.0  HGB 13.8 13.4  HCT 42.2 40.4  PLT 208 180    COAGS:  Recent Labs  11/28/13 1602 09/22/14 0833  INR 2.0 1.19  APTT  --  29    BMP:  Recent Labs  11/28/13 1602  NA 144  K 3.9  CL 108*  CO2 32  GLUCOSE 145*  BUN 26*  CALCIUM 8.1*  CREATININE 1.38*  GFRNONAA 53*  GFRAA >60    LIVER FUNCTION TESTS:  Recent Labs  11/28/13 1602  BILITOT 0.8  AST 24  ALT 20  ALKPHOS 117*  PROT 7.3  ALBUMIN 3.8    TUMOR MARKERS: No results for input(s): AFPTM, CEA, CA199, CHROMGRNA in the last 8760 hours.  Assessment and Plan:  Menelik Mcfarren is a 74 y.o. male with past mental history significant for dementia, coronary artery disease, hyperlipidemia, congestive heart failure, atrial fibrillation (on Coumadin) and urinary retention who has had a chronic indwelling urinary catheter for some time.  The patient is accompanied by his wife (who is the healthcare power of attorney) as well as his caregiver.    The patient has been referred by [facility] for image guided placement of a suprapubic catheter in hopes of removing the patient's current indwelling Foley catheter to decrease the chance of urethral injury in the setting of a chronic Foley catheter. He has been off Coumadin for the past 5 days.  Risks and Benefits discussed with the patient  and the patient's family including bleeding, infection and damage to adjacent structures All of the patient's questions were answered, patient is agreeable to proceed.  Consent signed and in chart.   A copy of this report was sent to the requesting provider on this date.  SignedSandi Mariscal 09/22/2014, 10:42 AM   I spent a total of 15 Minutes in face to face in clinical consultation, greater than 50% of which was  counseling/coordinating care for image guided placement of a suprapubic catheter for chronic urinary retention and incontinence.

## 2014-09-22 NOTE — Procedures (Signed)
Technically successful [place] and CT guided placed of a 12 Fr drainage catheter placement into the urinary bladder yielding return of clear urine.   No immediate post procedural complications.   Ronny Bacon, MD Pager #: [phone]

## 2014-09-25 NOTE — Telephone Encounter (Signed)
Open in error

## 2014-10-02 ENCOUNTER — Ambulatory Visit: Payer: Medicare Other

## 2014-10-02 DIAGNOSIS — N390 Urinary tract infection, site not specified: Secondary | ICD-10-CM

## 2014-10-02 NOTE — Addendum Note (Signed)
Addended by: Lestine Box on: 10/02/2014 02:46 PM   Modules accepted: Orders

## 2014-10-02 NOTE — Progress Notes (Signed)
Pt came in today for a SP tube urine for cx. Due to SP tube not being able to be plugged nurse held tube over specimen cup and allowed urine to drain. Drainage bag was reapplied and urine was sent for cx.

## 2014-10-04 LAB — CULTURE, URINE COMPREHENSIVE

## 2014-10-06 ENCOUNTER — Other Ambulatory Visit: Payer: Self-pay | Admitting: Urology

## 2014-10-06 ENCOUNTER — Telehealth: Payer: Self-pay

## 2014-10-06 ENCOUNTER — Other Ambulatory Visit: Payer: Self-pay

## 2014-10-06 DIAGNOSIS — N390 Urinary tract infection, site not specified: Secondary | ICD-10-CM

## 2014-10-06 NOTE — Telephone Encounter (Signed)
Pt is going to specials tomorrow for SP Tube flush. Juanita in [place] said they would do ucx then.

## 2014-10-06 NOTE — Telephone Encounter (Signed)
-----   Message from Nori Riis, PA-C sent at 10/05/2014  8:20 PM EDT ----- Patient will have to present to the office for another catheter specimen.

## 2014-10-07 ENCOUNTER — Ambulatory Visit
Admission: RE | Admit: 2014-10-07 | Discharge: 2014-10-07 | Disposition: A | Discharge status: Home / Self Care | Payer: Medicare Other | Source: Ambulatory Visit | Attending: Physician Assistant | Admitting: Physician Assistant

## 2014-10-07 DIAGNOSIS — Z466 Encounter for fitting and adjustment of urinary device: Secondary | ICD-10-CM | POA: Diagnosis not present

## 2014-10-07 NOTE — Progress Notes (Signed)
Patient arrived from family care facility with wife and caregiver.  Suprapubic catheter opened with alternating sterile saline flush and aspiration.  Draining well.  Urine specimen for C&S to lab.  Care and flushing instructions reviewed and demonstrated.  New statloc applied.

## 2014-10-10 ENCOUNTER — Telehealth: Payer: Self-pay

## 2014-10-10 DIAGNOSIS — N390 Urinary tract infection, site not specified: Secondary | ICD-10-CM

## 2014-10-10 NOTE — Telephone Encounter (Signed)
-----   Message from Nori Riis, PA-C sent at 10/10/2014  1:25 PM EDT ----- Patient's urine culture demonstrates a highly resistance organism.  He will need referral to ID.

## 2014-10-12 LAB — BODY FLUID CULTURE

## 2014-10-13 ENCOUNTER — Ambulatory Visit: Payer: Self-pay | Admitting: Urology

## 2014-10-13 NOTE — Telephone Encounter (Signed)
Spoke with Manuela Schwartz, pt caregiver, and made aware of ID referal. Order has been placed. Manuela Schwartz requested an order for a saline flushes. Manuela Schwartz said [facility] advised her to flush his sp tube. Please advise.

## 2014-10-13 NOTE — Telephone Encounter (Signed)
When this patient's appointment with ID?  Saline flushes 1 cc to flush suprapubic tube when necessary.

## 2014-10-14 NOTE — Telephone Encounter (Signed)
Called flushes into pt pharmacy. Referral was put in and front notified of order in que.

## 2014-10-15 ENCOUNTER — Ambulatory Visit: Payer: Medicare Other | Admitting: Urology

## 2014-10-15 ENCOUNTER — Telehealth: Payer: Self-pay | Admitting: Urology

## 2014-10-15 NOTE — Telephone Encounter (Signed)
Pt's caregiver, Manuela Schwartz, is requesting the status of a request for supplies to flush out a catheter.  Says that they are out now.  Please call her mobile [phone].

## 2014-10-15 NOTE — Telephone Encounter (Signed)
Spoke with Manuela Schwartz, pt caregiver, made aware flushes have been called in to pt pharmacy. Manuela Schwartz voiced understanding.

## 2014-10-16 NOTE — Telephone Encounter (Signed)
Do you want the pt to have monurol that was recommended by Dr. Ola Spurr?

## 2014-10-16 NOTE — Telephone Encounter (Signed)
Yes.  Monurol  3 grams pkt.  One pkt. daily for three days. No refill.

## 2014-10-17 MED ORDER — FOSFOMYCIN TROMETHAMINE 3 G PO PACK: 3.0000 g | PACK | Freq: Once | ORAL | Status: DC

## 2014-10-24 ENCOUNTER — Telehealth: Payer: Self-pay

## 2014-10-24 NOTE — Telephone Encounter (Signed)
Manuela Schwartz, pt caregiver, came into office today needing clarification on sp tube flush orders. Per Shannon sp tube flushes prn were d/c. New orders sent for 10cc flushes and leg bag orders placed.

## 2014-10-27 ENCOUNTER — Ambulatory Visit (INDEPENDENT_AMBULATORY_CARE_PROVIDER_SITE_OTHER): Payer: Medicare Other | Admitting: Urology

## 2014-10-27 ENCOUNTER — Telehealth: Payer: Self-pay | Admitting: Urology

## 2014-10-27 ENCOUNTER — Encounter: Payer: Self-pay | Admitting: Urology

## 2014-10-27 VITALS — BP 116/71 | HR 81 | Ht 71.0 in | Wt 197.5 lb

## 2014-10-27 DIAGNOSIS — R32 Unspecified urinary incontinence: Secondary | ICD-10-CM

## 2014-10-27 DIAGNOSIS — R339 Retention of urine, unspecified: Secondary | ICD-10-CM

## 2014-10-27 NOTE — Progress Notes (Signed)
9:56 AM   Bobby Jacobs Jago Jun 09, 1940 [id number]  Referring provider: [name], PA [address] [place], [place] [address]  Chief Complaint  Patient presents with  . Follow-up    H&P for SPT change    HPI: [name] is a 74 year old white male with a history of urinary retention and urinary incontinence who presents today to discuss his second SPT placement under CT guidance with IR.   He will need to stop his Coumadin four days prior to the procedure.   Patient has dementia and is unable to recognize when he needs to urinate. He is having overflow incontinence.  He has recently been evaluated through infectious disease.  He is currently on Cipro for the drainage around his SPT site.  He is also on a cream for his balanitis.  ID will defer his urine collection for infection to [place].  I explained to the caregiver that we would be concerned about infection if the patient had a fever, increase in agitation or mental confusion or hematuria.    PMH: Past Medical History  Diagnosis Date  . Hypertension   . Skin cancer   . Coronary artery disease   . Depression   . GERD (gastroesophageal reflux disease)   . Cardiovascular disease   . High cholesterol   . Pneumonia   . Fracture   . Obesity   . Sleep apnea   . CHF (congestive heart failure) ([place])   . Dementia   . Atrial fibrillation ([place])   . Urinary retention   . Urine frequency   . Incomplete bladder emptying   . Diabetes mellitus, type 2 ([place])   . Stroke ([place])   . Peripheral vascular disease ([place])   . Aortic aneurysm ([place])   . Aortic valve replaced   . Hyperthyroidism     Surgical History: Past Surgical History  Procedure Laterality Date  . Hernia repair    . Cardiac surgery    . Neck surgery    . Degenerative disk disease    . Circumcision    . Coronary artery bypass graft      Home Medications:    Medication List       This list is accurate as of: 10/27/14  9:56 AM.  Always use your most recent  med list.               AMLODIPINE BESYLATE PO  Take 10 mg by mouth.     atorvastatin 80 MG tablet  Commonly known as:  LIPITOR  Take 80 mg by mouth daily.     carbamide peroxide 6.5 % otic solution  Commonly known as:  DEBROX  Place 5 drops into both ears 2 (two) times daily as needed.     ciprofloxacin 750 MG tablet  Commonly known as:  CIPRO  Take by mouth.     clotrimazole 1 % cream  Commonly known as:  LOTRIMIN  Apply topically.     FIBER 7 Powd  Take 1 scoop by mouth 3 (three) times daily.     fosfomycin 3 G Pack  Commonly known as:  MONUROL  Take 3 g by mouth once.     gabapentin 300 MG capsule  Commonly known as:  NEURONTIN  Take 300 mg by mouth 3 (three) times daily.     glucose blood test strip  1 each by Other route as needed for other. Use as instructed     insulin detemir 100 UNIT/ML injection  Commonly  known as:  LEVEMIR  Inject into the skin at bedtime.     insulin lispro 100 UNIT/ML injection  Commonly known as:  HUMALOG  Inject into the skin 3 (three) times daily before meals.     HUMALOG KWIKPEN 100 UNIT/ML KiwkPen  Generic drug:  insulin lispro     losartan 100 MG tablet  Commonly known as:  COZAAR     MAGELLAN INSULIN SAFETY SYR 29G X 1/2" 1 ML Misc  Generic drug:  INSULIN SYRINGE 1CC/29G     metoprolol 50 MG tablet  Commonly known as:  LOPRESSOR  Take 50 mg by mouth 2 (two) times daily.     NOVOFINE 32G X 6 MM Misc  Generic drug:  Insulin Pen Needle  by Does not apply route.     QUEtiapine 50 MG tablet  Commonly known as:  SEROQUEL  Take 50 mg by mouth at bedtime.     ranitidine 150 MG capsule  Commonly known as:  ZANTAC  Take 150 mg by mouth 2 (two) times daily.     ranitidine 150 MG tablet  Commonly known as:  ZANTAC     TOUJEO SOLOSTAR 300 UNIT/ML Sopn  Generic drug:  Insulin Glargine     traZODone 100 MG tablet  Commonly known as:  DESYREL  Take 150 mg by mouth at bedtime.     triamcinolone cream 0.1 %    Commonly known as:  KENALOG     VISION-VITE PRESERVE PO  Take by mouth.     vitamin B-12 100 MCG tablet  Commonly known as:  CYANOCOBALAMIN  Take 100 mcg by mouth daily.     warfarin 7.5 MG tablet  Commonly known as:  COUMADIN  Take 7.5 mg by mouth daily.        Allergies: No Known Allergies  Family History: Family History  Problem Relation Age of Onset  . Cancer Neg Hx     Kidney,Bladder ,Prostate    Social History:  reports that he has quit smoking. He quit smokeless tobacco use about 2 years ago. He reports that he does not drink alcohol or use illicit drugs.  ROS: Urological Symptom Review  Patient is experiencing the following symptoms: Frequent urination Hard to postpone urination Get up at night to urinate Leakage of urine   Review of Systems  Gastrointestinal (upper)  : Negative for upper GI symptoms  Gastrointestinal (lower) : Constipation  Constitutional : Negative for symptoms  Skin: Negative for skin symptoms  Eyes: Negative for eye symptoms  Ear/Nose/Throat : Negative for Ear/Nose/Throat symptoms  Hematologic/Lymphatic: Negative for Hematologic/Lymphatic symptoms  Cardiovascular : Negative for cardiovascular symptoms  Respiratory : Shortness of breath  Endocrine: Excessive thirst  Musculoskeletal: Back pain  Neurological: Negative for neurological symptoms  Psychologic: Negative for psychiatric symptoms   Physical Exam: BP 116/71 mmHg  Pulse 81  Ht 5\' 11"  (1.803 m)  Wt 197 lb 8 oz (89.585 kg)  BMI 27.56 kg/m2  Constitutional: Alert and oriented. Well appearing and in no acute distress. Eyes: Conjunctivae are normal. PERRL. EOMI. Head: Atraumatic. Nose: No congestion/rhinorrhea. Mouth/Throat: Mucous membranes are moist. Oropharynx non-erythematous. Neck: No stridor.  Cardiovascular: Normal rate, regular rhythm. Grossly normal heart sounds. Good peripheral circulation. Respiratory: Normal respiratory effort.  No retractions. Lungs CTAB. Gastrointestinal: Soft with faint LLQ tenderness, no rebound or guarding. No distention. No abdominal bruits. No CVA tenderness.  SPT site has a mucous drainage.   Genitourinary: GU: Patient with uncircumcised phallus.  Foreskin easily retracted  Urethral  meatus is patent.  No penile discharge. No penile lesions or rashes. Scrotum without lesions, cysts, rashes and/or edema.  Testicles are located scrotally bilaterally. No masses are appreciated in the testicles. Left and right epididymis are normal. Musculoskeletal: No lower extremity tenderness nor edema. No joint effusions. Neurologic: Normal speech and language. No gross focal neurologic deficits are appreciated. Speech is normal. No gait instability. Skin: Skin is warm, dry and intact. No rash noted. Psychiatric: Patient suffers with dementia.   Laboratory Data:  Lab Results  Component Value Date   WBC 10.0 09/22/2014   HGB 13.4 09/22/2014   HCT 40.4 09/22/2014   MCV 88.3 09/22/2014   PLT 180 09/22/2014    Lab Results  Component Value Date   CREATININE 1.38* 11/28/2013   Assessment & Plan:    1. Urinary retention:   Patient will undergo a second SPT placement under CT guidance for dilation.    Coumadin to be held for four days prior to the procedure.    2. Urinary incontinence:  Also likely due to overflow incontinence. We will manage with SPT placement.   Zara Council, [facility] [address], [place], [place] [address] [phone]

## 2014-10-27 NOTE — Telephone Encounter (Signed)
Spoke with patient's caregiver and she verbalized understanding

## 2014-10-27 NOTE — Telephone Encounter (Signed)
Please remind the patient's caregiver that he needs to stop his Coumadin 4 days prior to the SPT exchange.  His procedure is on the 14th.

## 2014-11-05 ENCOUNTER — Emergency Department (HOSPITAL_COMMUNITY): Payer: Medicare Other

## 2014-11-05 ENCOUNTER — Telehealth: Payer: Self-pay | Admitting: Urology

## 2014-11-05 ENCOUNTER — Encounter (HOSPITAL_COMMUNITY): Payer: Self-pay | Admitting: Emergency Medicine

## 2014-11-05 ENCOUNTER — Emergency Department (HOSPITAL_COMMUNITY)
Admission: EM | Admit: 2014-11-05 | Discharge: 2014-11-06 | Disposition: A | Discharge status: Home / Self Care | Payer: Medicare Other | Attending: Emergency Medicine | Admitting: Emergency Medicine

## 2014-11-05 DIAGNOSIS — Z9889 Other specified postprocedural states: Secondary | ICD-10-CM | POA: Diagnosis not present

## 2014-11-05 DIAGNOSIS — F329 Major depressive disorder, single episode, unspecified: Secondary | ICD-10-CM | POA: Insufficient documentation

## 2014-11-05 DIAGNOSIS — K5900 Constipation, unspecified: Secondary | ICD-10-CM

## 2014-11-05 DIAGNOSIS — I10 Essential (primary) hypertension: Secondary | ICD-10-CM | POA: Insufficient documentation

## 2014-11-05 DIAGNOSIS — E7800 Pure hypercholesterolemia, unspecified: Secondary | ICD-10-CM | POA: Diagnosis not present

## 2014-11-05 DIAGNOSIS — Z951 Presence of aortocoronary bypass graft: Secondary | ICD-10-CM | POA: Diagnosis not present

## 2014-11-05 DIAGNOSIS — I509 Heart failure, unspecified: Secondary | ICD-10-CM | POA: Diagnosis not present

## 2014-11-05 DIAGNOSIS — Z794 Long term (current) use of insulin: Secondary | ICD-10-CM | POA: Diagnosis not present

## 2014-11-05 DIAGNOSIS — R339 Retention of urine, unspecified: Secondary | ICD-10-CM | POA: Diagnosis present

## 2014-11-05 DIAGNOSIS — I2510 Atherosclerotic heart disease of native coronary artery without angina pectoris: Secondary | ICD-10-CM | POA: Diagnosis not present

## 2014-11-05 DIAGNOSIS — E669 Obesity, unspecified: Secondary | ICD-10-CM | POA: Diagnosis not present

## 2014-11-05 DIAGNOSIS — Z8673 Personal history of transient ischemic attack (TIA), and cerebral infarction without residual deficits: Secondary | ICD-10-CM | POA: Diagnosis not present

## 2014-11-05 DIAGNOSIS — I4891 Unspecified atrial fibrillation: Secondary | ICD-10-CM | POA: Insufficient documentation

## 2014-11-05 DIAGNOSIS — Z7901 Long term (current) use of anticoagulants: Secondary | ICD-10-CM | POA: Diagnosis not present

## 2014-11-05 DIAGNOSIS — F0390 Unspecified dementia without behavioral disturbance: Secondary | ICD-10-CM | POA: Insufficient documentation

## 2014-11-05 DIAGNOSIS — Z8701 Personal history of pneumonia (recurrent): Secondary | ICD-10-CM | POA: Insufficient documentation

## 2014-11-05 DIAGNOSIS — Z8781 Personal history of (healed) traumatic fracture: Secondary | ICD-10-CM | POA: Insufficient documentation

## 2014-11-05 DIAGNOSIS — Z87891 Personal history of nicotine dependence: Secondary | ICD-10-CM | POA: Diagnosis not present

## 2014-11-05 DIAGNOSIS — N390 Urinary tract infection, site not specified: Secondary | ICD-10-CM | POA: Insufficient documentation

## 2014-11-05 DIAGNOSIS — Z79899 Other long term (current) drug therapy: Secondary | ICD-10-CM | POA: Diagnosis not present

## 2014-11-05 DIAGNOSIS — Z8669 Personal history of other diseases of the nervous system and sense organs: Secondary | ICD-10-CM | POA: Diagnosis not present

## 2014-11-05 DIAGNOSIS — E119 Type 2 diabetes mellitus without complications: Secondary | ICD-10-CM | POA: Insufficient documentation

## 2014-11-05 DIAGNOSIS — R140 Abdominal distension (gaseous): Secondary | ICD-10-CM

## 2014-11-05 DIAGNOSIS — K219 Gastro-esophageal reflux disease without esophagitis: Secondary | ICD-10-CM | POA: Insufficient documentation

## 2014-11-05 DIAGNOSIS — Z85828 Personal history of other malignant neoplasm of skin: Secondary | ICD-10-CM | POA: Insufficient documentation

## 2014-11-05 DIAGNOSIS — T83511A Infection and inflammatory reaction due to indwelling urethral catheter, initial encounter: Secondary | ICD-10-CM | POA: Diagnosis not present

## 2014-11-05 DIAGNOSIS — Y846 Urinary catheterization as the cause of abnormal reaction of the patient, or of later complication, without mention of misadventure at the time of the procedure: Secondary | ICD-10-CM | POA: Insufficient documentation

## 2014-11-05 DIAGNOSIS — Z792 Long term (current) use of antibiotics: Secondary | ICD-10-CM | POA: Diagnosis not present

## 2014-11-05 LAB — PROTIME-INR
INR: 2.69 — AB (ref 0.00–1.49)
Prothrombin Time: 28.2 seconds — ABNORMAL HIGH (ref 11.6–15.2)

## 2014-11-05 LAB — CBC WITH DIFFERENTIAL/PLATELET
BASOS ABS: 0 10*3/uL (ref 0.0–0.1)
BASOS PCT: 0 %
EOS PCT: 3 %
Eosinophils Absolute: 0.4 10*3/uL (ref 0.0–0.7)
HCT: 38.7 % — ABNORMAL LOW (ref 39.0–52.0)
Hemoglobin: 12.9 g/dL — ABNORMAL LOW (ref 13.0–17.0)
Lymphocytes Relative: 11 %
Lymphs Abs: 1.4 10*3/uL (ref 0.7–4.0)
MCH: 29.1 pg (ref 26.0–34.0)
MCHC: 33.3 g/dL (ref 30.0–36.0)
MCV: 87.2 fL (ref 78.0–100.0)
MONO ABS: 1.4 10*3/uL — AB (ref 0.1–1.0)
Monocytes Relative: 11 %
NEUTROS ABS: 9 10*3/uL — AB (ref 1.7–7.7)
Neutrophils Relative %: 75 %
PLATELETS: 210 10*3/uL (ref 150–400)
RBC: 4.44 MIL/uL (ref 4.22–5.81)
RDW: 13.6 % (ref 11.5–15.5)
WBC: 12.2 10*3/uL — AB (ref 4.0–10.5)

## 2014-11-05 LAB — BASIC METABOLIC PANEL
Anion gap: 9 (ref 5–15)
BUN: 24 mg/dL — AB (ref 6–20)
CALCIUM: 8.7 mg/dL — AB (ref 8.9–10.3)
CO2: 25 mmol/L (ref 22–32)
CREATININE: 1.37 mg/dL — AB (ref 0.61–1.24)
Chloride: 101 mmol/L (ref 101–111)
GFR calc Af Amer: 56 mL/min — ABNORMAL LOW (ref >60)
GFR, EST NON AFRICAN AMERICAN: 49 mL/min — AB (ref >60)
Glucose, Bld: 113 mg/dL — ABNORMAL HIGH (ref 65–99)
Potassium: 3.9 mmol/L (ref 3.5–5.1)
SODIUM: 135 mmol/L (ref 135–145)

## 2014-11-05 LAB — URINALYSIS, ROUTINE W REFLEX MICROSCOPIC
Bilirubin Urine: NEGATIVE
GLUCOSE, UA: NEGATIVE mg/dL
KETONES UR: NEGATIVE mg/dL
Nitrite: POSITIVE — AB
PROTEIN: 100 mg/dL — AB
Specific Gravity, Urine: 1.01 (ref 1.005–1.030)
Urobilinogen, UA: 0.2 mg/dL (ref 0.0–1.0)
pH: 7.5 (ref 5.0–8.0)

## 2014-11-05 LAB — URINE MICROSCOPIC-ADD ON

## 2014-11-05 MED ORDER — DEXTROSE 5 % IV SOLN
1.0000 g | Freq: Once | INTRAVENOUS | Status: AC
  Administered 2014-11-05: 1 g via INTRAVENOUS
  Filled 2014-11-05: qty 10

## 2014-11-05 NOTE — Telephone Encounter (Signed)
Spoke with Manuela Schwartz, pt caregiver, in reference to pt having a distended stomach, blood in urinary bag, urinating from the penis, not able to walk, and lethargic. Manuela Schwartz stated pt does not have n/v, f/c. Nurse advised Manuela Schwartz to take pt to the ER but also contact Dr.Fitzgerald's office-infectious disease doctor.  Brandy, Dr. Blane Ohara nurse, called stating they do not think the current problems are coming from his infection due to pt currently taking cipro.  Nurse spoke with Manuela Schwartz after speaking with Theadora Rama and again advised her to take pt to doctor. Manuela Schwartz voiced understanding.

## 2014-11-05 NOTE — ED Provider Notes (Signed)
CSN: [id number]     Arrival date & time 11/05/14  1858 History   First MD Initiated Contact with Patient 11/05/14 2058     Chief Complaint  Patient presents with  . Hematuria  . Urinary Retention     (Consider location/radiation/quality/duration/timing/severity/associated sxs/prior Treatment) HPI 74 year old male who presents with concern for urinary retention. He has a history of dementia, aortic valve replacement, prior CVA, type 2 diabetes, and atrial fibrillation on Coumadin. History of urinary retention and incontinence status post suprapubic catheter placement. History is provided by the patient's caregiver in the setting of patient's dementia. She states that over the course of the past day he seems to have some increasing abdominal distention. He has had some decreased urine output in the setting. They have flushed his catheter, without difficulty. They have noticed that when they do flush his catheter he has rose tinged urine that comes out, but subsequently clears after additional urination. He has bowel movements every other day, and has not had any nausea or vomiting. He has not been complaining of abdominal pain and has not had fevers, chills, cough, difficulty breathing, or passing clots. Eating and drinking normally.    Past Medical History  Diagnosis Date  . Hypertension   . Skin cancer   . Coronary artery disease   . Depression   . GERD (gastroesophageal reflux disease)   . Cardiovascular disease   . High cholesterol   . Pneumonia   . Fracture   . Obesity   . Sleep apnea   . CHF (congestive heart failure) ([place])   . Dementia   . Atrial fibrillation ([place])   . Urinary retention   . Urine frequency   . Incomplete bladder emptying   . Diabetes mellitus, type 2 ([place])   . Stroke ([place])   . Peripheral vascular disease ([place])   . Aortic aneurysm ([place])   . Aortic valve replaced   . Hyperthyroidism    Past Surgical History  Procedure Laterality Date  . Hernia repair     . Cardiac surgery    . Neck surgery    . Degenerative disk disease    . Circumcision    . Coronary artery bypass graft     Family History  Problem Relation Age of Onset  . Cancer Neg Hx     Kidney,Bladder ,Prostate   Social History  Substance Use Topics  . Smoking status: Former Research scientist (life sciences)  . Smokeless tobacco: Former Systems developer    Quit date: 09/21/2012     Comment: quit one year ago  . Alcohol Use: No    Review of Systems  Unable to perform ROS: Dementia      Allergies  Review of patient's allergies indicates no known allergies.  Home Medications   Prior to Admission medications   Medication Sig Start Date End Date Taking? Authorizing Provider  amLODipine (NORVASC) 10 MG tablet Take 10 mg by mouth daily.   Yes Historical Provider, MD  atorvastatin (LIPITOR) 80 MG tablet Take 80 mg by mouth daily at 6 PM.    Yes Historical Provider, MD  Calcium Carb-Cholecalciferol (CALCIUM-VITAMIN D) 600-400 MG-UNIT TABS Take 1 tablet by mouth 2 (two) times daily.   Yes Historical Provider, MD  carbamide peroxide (DEBROX) 6.5 % otic solution Place 5 drops into both ears 2 (two) times a week.    Yes Historical Provider, MD  ciprofloxacin (CIPRO) 750 MG tablet Take 750 mg by mouth 2 (two) times daily.  10/24/14 11/14/14 Yes Historical Provider, MD  clotrimazole (LOTRIMIN) 1 % cream Apply 1 application topically 2 (two) times daily. APPLY TO PENIS 10/24/14 10/24/15 Yes Historical Provider, MD  gabapentin (NEURONTIN) 300 MG capsule Take 300 mg by mouth 3 (three) times daily.   Yes Historical Provider, MD  insulin lispro (HUMALOG) 100 UNIT/ML injection Inject 4 Units into the skin 3 (three) times daily before meals. HOLD IF CBG <80   Yes Historical Provider, MD  losartan (COZAAR) 100 MG tablet Take 100 mg by mouth daily.  07/19/14  Yes Historical Provider, MD  metoprolol (LOPRESSOR) 50 MG tablet Take 50 mg by mouth 2 (two) times daily.   Yes Historical Provider, MD  Miconazole Nitrate (FUNGOID TINCTURE) 2  % SOLN Apply 1 application topically 2 (two) times daily as needed (FOR AFFECTED AREAS).   Yes Historical Provider, MD  Misc Natural Products (FIBER 7) POWD Take 1 scoop by mouth 3 (three) times daily.   Yes Historical Provider, MD  Multiple Vitamins-Minerals (PRESERVISION AREDS 2) CAPS Take 1 capsule by mouth 2 (two) times daily.   Yes Historical Provider, MD  PRESCRIPTION MEDICATION Inhale 1 application into the lungs at bedtime. BIPAP   Yes Historical Provider, MD  psyllium (METAMUCIL SMOOTH TEXTURE) 28 % packet Take 1 packet by mouth 3 (three) times daily. HOLD FOR DIARRHEA   Yes Historical Provider, MD  QUEtiapine (SEROQUEL) 50 MG tablet Take 50 mg by mouth at bedtime.   Yes Historical Provider, MD  ranitidine (ZANTAC) 150 MG capsule Take 150 mg by mouth every morning.    Yes Historical Provider, MD  TOUJEO SOLOSTAR 300 UNIT/ML SOPN Take 38 Units by mouth every morning.  09/10/14  Yes Historical Provider, MD  traZODone (DESYREL) 100 MG tablet Take 150 mg by mouth at bedtime.    Yes Historical Provider, MD  vitamin B-12 (CYANOCOBALAMIN) 1000 MCG tablet Take 1,000 mcg by mouth daily.   Yes Historical Provider, MD  fosfomycin (MONUROL) 3 G PACK Take 3 g by mouth once. Patient not taking: Reported on 10/27/2014 10/17/14   Larene Beach A McGowan, PA-C  polyethylene glycol (MIRALAX / GLYCOLAX) packet Take 17 g by mouth 2 (two) times daily. Hold if loose stools 11/06/14   Forde Dandy, MD  warfarin (COUMADIN) 7.5 MG tablet Take 7.5 mg by mouth daily.    Historical Provider, MD   BP 122/76 mmHg  Pulse 84  Temp(Src) 98.1 F (36.7 C) (Oral)  Resp 20  Ht 5\' 11"  (1.803 m)  Wt 199 lb (90.266 kg)  BMI 27.77 kg/m2  SpO2 98% Physical Exam Physical Exam  Nursing note and vitals reviewed. Constitutional: Well developed, well nourished, non-toxic, and in no acute distress Head: Normocephalic and atraumatic.  Mouth/Throat: Oropharynx is clear and moist.  Neck: Normal range of motion. Neck supple.   Cardiovascular: Normal rate and regular rhythm.   Pulmonary/Chest: Effort normal and breath sounds normal.  Abdominal: Soft. Moderately distended. There is no tenderness. There is no rebound and no guarding. Suprapubic catheter in place draining light yellow urine. No active leakage. Musculoskeletal: Normal range of motion.  Neurological: Alert, no facial droop, fluent speech, moves all extremities symmetrically Skin: Skin is warm and dry.  Psychiatric: Cooperative  ED Course  Procedures (including critical care time) Labs Review Labs Reviewed  URINALYSIS, ROUTINE W REFLEX MICROSCOPIC (NOT AT [facility]) - Abnormal; Notable for the following:    APPearance TURBID (*)    Hgb urine dipstick LARGE (*)    Protein, ur 100 (*)    Nitrite POSITIVE (*)  Leukocytes, UA LARGE (*)    All other components within normal limits  CBC WITH DIFFERENTIAL/PLATELET - Abnormal; Notable for the following:    WBC 12.2 (*)    Hemoglobin 12.9 (*)    HCT 38.7 (*)    Neutro Abs 9.0 (*)    Monocytes Absolute 1.4 (*)    All other components within normal limits  PROTIME-INR - Abnormal; Notable for the following:    Prothrombin Time 28.2 (*)    INR 2.69 (*)    All other components within normal limits  BASIC METABOLIC PANEL - Abnormal; Notable for the following:    Glucose, Bld 113 (*)    BUN 24 (*)    Creatinine, Ser 1.37 (*)    Calcium 8.7 (*)    GFR calc non Af Amer 49 (*)    GFR calc Af Amer 56 (*)    All other components within normal limits  URINE MICROSCOPIC-ADD ON - Abnormal; Notable for the following:    Bacteria, UA MANY (*)    Crystals TRIPLE PHOSPHATE CRYSTALS (*)    All other components within normal limits  URINE CULTURE    Imaging Review Dg Chest 2 View  11/05/2014  CLINICAL DATA:  Distended abdomen, hematuria, cough. Suprapubic tube. EXAM: CHEST  2 VIEW COMPARISON:  08/03/2013 FINDINGS: Postoperative changes in the mediastinum. Shallow inspiration. Normal heart size and pulmonary  vascularity. Slight linear atelectasis in the lung bases. No focal airspace disease or consolidation in the lungs. No blunting of costophrenic angles. No pneumothorax. Mediastinal contours appear intact. Calcified and tortuous aorta. Degenerative changes in the spine and shoulders. IMPRESSION: No active cardiopulmonary disease. Electronically Signed   By: Lucienne Capers M.D.   On: 11/05/2014 22:36   Dg Abd 2 Views  11/05/2014  CLINICAL DATA:  74 year old male with abdominal distention and hematuria. Patient with suprapubic catheter. EXAM: ABDOMEN - 2 VIEW COMPARISON:  08/04/2014 CT FINDINGS: A moderate to large amount of colonic and rectal stool identified. There is no evidence of small bowel obstruction or pneumoperitoneum. The catheter overlying the pelvis is present. No suspicious calcifications are identified. Degenerative changes in the lumbar spine again noted. IMPRESSION: No evidence of acute abnormality. Moderate to large amount of colonic and rectal stool. Catheter overlying the pelvis. Electronically Signed   By: Margarette Canada M.D.   On: 11/05/2014 22:37   I have personally reviewed and evaluated these images and lab results as part of my medical decision-making.   MDM   Final diagnoses:  Abdominal distension  Urinary tract infection associated with catheterization of urinary tract, initial encounter  Constipation, unspecified constipation type    In short, this is a 74 year old male with history of atrial fibrillation on Coumadin, dementia, and chronic urinary retention with suprapubic catheter who presents with abdominal distention and concern for urinary retention. He is well-appearing, comfortable, in no acute distress on presentation. Vital signs are overall non-concerning. Abdomen is soft and nontender but moderately distended. Suprapubic catheter appears to be in place and draining yellow urine. No gross hematuria noted. Clinically does not appear obstructed. Bedside bladder scan  shows 76 mL of urine within his bladder, and this is not consistent with urinary retention. Catheter appears to be draining urine adequately. Nitrite positive and leukocyte esterase positive urine, and is currently taking ciprofloxacin. Will follow up with his urologist on Friday with this issue as scheduled, but was given 1 g of ceftriaxone in the ED. He'll continue his ciprofloxacin for treatment at home. Abdominal x-ray is  performed, and shows a moderate amount of fecal loading. He is started on MiraLAX for abdominal distention. No abdominal pain, and tolerating by mouth without difficulty. I do not suspect obstruction or any other acute intra-abdominal process. Appropriate renal function on blood work and he has a therapeutic INR. Patient will follow-up closely with his PCP as well as see his urologist as scheduled on Friday. He is felt appropriate for discharge home. Strict return and follow-up instructions are reviewed. His caregiver expressed understanding of all discharge instructions for comfortable to plan of care.     Forde Dandy, MD 11/06/14 [phone]

## 2014-11-05 NOTE — ED Notes (Signed)
Patient transported to X-ray 

## 2014-11-05 NOTE — ED Notes (Signed)
Has a suprapubic tube and suppose to have six increased in 2 days.  Currently caregiver at bedside states extended abdomen and hematuria. Patient denies pain.

## 2014-11-05 NOTE — Telephone Encounter (Signed)
Manuela Schwartz called and said he isn't urinating as much as usual, down by 2/3.  Stomach is extended, blood in urine, not walking, procedure is this Friday.  No order to hold coumadin  PTINR was 2.5, Maybe SPT is jammed and not letting urine out, pt has urinated through penis.  She needs order to hold coumadin the rest of the week.  Please call [phone].

## 2014-11-06 ENCOUNTER — Other Ambulatory Visit: Payer: Self-pay | Admitting: Radiology

## 2014-11-06 MED ORDER — POLYETHYLENE GLYCOL 3350 17 G PO PACK: 17.0000 g | PACK | Freq: Two times a day (BID) | ORAL | Status: DC

## 2014-11-06 NOTE — Discharge Instructions (Signed)
Return without fail for worsening symptoms, including confusion, severe abdominal pain, vomiting unable to keep down food or fluids, fevers, or any other symptoms concerning to you. Continue to follow-up with your urologist on Friday as scheduled. Continue taking your antibiotics as prescribed.  Urinary Tract Infection Urinary tract infections (UTIs) can develop anywhere along your urinary tract. Your urinary tract is your body's drainage system for removing wastes and extra water. Your urinary tract includes two kidneys, two ureters, a bladder, and a urethra. Your kidneys are a pair of bean-shaped organs. Each kidney is about the size of your fist. They are located below your ribs, one on each side of your spine. CAUSES Infections are caused by microbes, which are microscopic organisms, including fungi, viruses, and bacteria. These organisms are so small that they can only be seen through a microscope. Bacteria are the microbes that most commonly cause UTIs. SYMPTOMS  Symptoms of UTIs may vary by age and gender of the patient and by the location of the infection. Symptoms in young women typically include a frequent and intense urge to urinate and a painful, burning feeling in the bladder or urethra during urination. Older women and men are more likely to be tired, shaky, and weak and have muscle aches and abdominal pain. A fever may mean the infection is in your kidneys. Other symptoms of a kidney infection include pain in your back or sides below the ribs, nausea, and vomiting. DIAGNOSIS To diagnose a UTI, your caregiver will ask you about your symptoms. Your caregiver will also ask you to provide a urine sample. The urine sample will be tested for bacteria and white blood cells. White blood cells are made by your body to help fight infection. TREATMENT  Typically, UTIs can be treated with medication. Because most UTIs are caused by a bacterial infection, they usually can be treated with the use of  antibiotics. The choice of antibiotic and length of treatment depend on your symptoms and the type of bacteria causing your infection. HOME CARE INSTRUCTIONS  If you were prescribed antibiotics, take them exactly as your caregiver instructs you. Finish the medication even if you feel better after you have only taken some of the medication.  Drink enough water and fluids to keep your urine clear or pale yellow.  Avoid caffeine, tea, and carbonated beverages. They tend to irritate your bladder.  Empty your bladder often. Avoid holding urine for long periods of time.  Empty your bladder before and after sexual intercourse.  After a bowel movement, women should cleanse from front to back. Use each tissue only once. SEEK MEDICAL CARE IF:   You have back pain.  You develop a fever.  Your symptoms do not begin to resolve within 3 days. SEEK IMMEDIATE MEDICAL CARE IF:   You have severe back pain or lower abdominal pain.  You develop chills.  You have nausea or vomiting.  You have continued burning or discomfort with urination. MAKE SURE YOU:   Understand these instructions.  Will watch your condition.  Will get help right away if you are not doing well or get worse.   This information is not intended to replace advice given to you by your health care provider. Make sure you discuss any questions you have with your health care provider.   Document Released: 10/20/2004 Document Revised: 10/01/2014 Document Reviewed: 02/18/2011 Elsevier Interactive Patient Education 2016 [employer].  Constipation, Adult Constipation is when a person has fewer than three bowel movements a week, has  difficulty having a bowel movement, or has stools that are dry, hard, or larger than normal. As people grow older, constipation is more common. A low-fiber diet, not taking in enough fluids, and taking certain medicines may make constipation worse.  CAUSES   Certain medicines, such as antidepressants,  pain medicine, iron supplements, antacids, and water pills.   Certain diseases, such as diabetes, irritable bowel syndrome (IBS), thyroid disease, or depression.   Not drinking enough water.   Not eating enough fiber-rich foods.   Stress or travel.   Lack of physical activity or exercise.   Ignoring the urge to have a bowel movement.   Using laxatives too much.  SIGNS AND SYMPTOMS   Having fewer than three bowel movements a week.   Straining to have a bowel movement.   Having stools that are hard, dry, or larger than normal.   Feeling full or bloated.   Pain in the lower abdomen.   Not feeling relief after having a bowel movement.  DIAGNOSIS  Your health care provider will take a medical history and perform a physical exam. Further testing may be done for severe constipation. Some tests may include:  A barium enema X-ray to examine your rectum, colon, and, sometimes, your small intestine.   A sigmoidoscopy to examine your lower colon.   A colonoscopy to examine your entire colon. TREATMENT  Treatment will depend on the severity of your constipation and what is causing it. Some dietary treatments include drinking more fluids and eating more fiber-rich foods. Lifestyle treatments may include regular exercise. If these diet and lifestyle recommendations do not help, your health care provider may recommend taking over-the-counter laxative medicines to help you have bowel movements. Prescription medicines may be prescribed if over-the-counter medicines do not work.  HOME CARE INSTRUCTIONS   Eat foods that have a lot of fiber, such as fruits, vegetables, whole grains, and beans.  Limit foods high in fat and processed sugars, such as french fries, hamburgers, cookies, candies, and soda.   A fiber supplement may be added to your diet if you cannot get enough fiber from foods.   Drink enough fluids to keep your urine clear or pale yellow.   Exercise regularly  or as directed by your health care provider.   Go to the restroom when you have the urge to go. Do not hold it.   Only take over-the-counter or prescription medicines as directed by your health care provider. Do not take other medicines for constipation without talking to your health care provider first.  [place] IF:   You have bright red blood in your stool.   Your constipation lasts for more than 4 days or gets worse.   You have abdominal or rectal pain.   You have thin, pencil-like stools.   You have unexplained weight loss. MAKE SURE YOU:   Understand these instructions.  Will watch your condition.  Will get help right away if you are not doing well or get worse.   This information is not intended to replace advice given to you by your health care provider. Make sure you discuss any questions you have with your health care provider.   Document Released: 10/09/2003 Document Revised: 01/31/2014 Document Reviewed: 10/22/2012 Elsevier Interactive Patient Education [employer].

## 2014-11-07 ENCOUNTER — Ambulatory Visit: Admission: RE | Admit: 2014-11-07 | Payer: No Typology Code available for payment source | Source: Ambulatory Visit

## 2014-11-07 ENCOUNTER — Ambulatory Visit
Admission: RE | Admit: 2014-11-07 | Discharge: 2014-11-07 | Disposition: A | Discharge status: Home / Self Care | Payer: Medicare Other | Source: Ambulatory Visit | Attending: Urology | Admitting: Urology

## 2014-11-07 ENCOUNTER — Other Ambulatory Visit: Payer: Self-pay | Admitting: Urology

## 2014-11-07 DIAGNOSIS — I110 Hypertensive heart disease with heart failure: Secondary | ICD-10-CM | POA: Diagnosis not present

## 2014-11-07 DIAGNOSIS — Z951 Presence of aortocoronary bypass graft: Secondary | ICD-10-CM | POA: Diagnosis not present

## 2014-11-07 DIAGNOSIS — E119 Type 2 diabetes mellitus without complications: Secondary | ICD-10-CM | POA: Insufficient documentation

## 2014-11-07 DIAGNOSIS — E7800 Pure hypercholesterolemia, unspecified: Secondary | ICD-10-CM | POA: Insufficient documentation

## 2014-11-07 DIAGNOSIS — Z87891 Personal history of nicotine dependence: Secondary | ICD-10-CM | POA: Diagnosis not present

## 2014-11-07 DIAGNOSIS — I4891 Unspecified atrial fibrillation: Secondary | ICD-10-CM | POA: Insufficient documentation

## 2014-11-07 DIAGNOSIS — Z952 Presence of prosthetic heart valve: Secondary | ICD-10-CM | POA: Insufficient documentation

## 2014-11-07 DIAGNOSIS — Z8673 Personal history of transient ischemic attack (TIA), and cerebral infarction without residual deficits: Secondary | ICD-10-CM | POA: Insufficient documentation

## 2014-11-07 DIAGNOSIS — R05 Cough: Secondary | ICD-10-CM | POA: Diagnosis not present

## 2014-11-07 DIAGNOSIS — R339 Retention of urine, unspecified: Secondary | ICD-10-CM

## 2014-11-07 DIAGNOSIS — K219 Gastro-esophageal reflux disease without esophagitis: Secondary | ICD-10-CM | POA: Diagnosis not present

## 2014-11-07 DIAGNOSIS — F329 Major depressive disorder, single episode, unspecified: Secondary | ICD-10-CM | POA: Insufficient documentation

## 2014-11-07 DIAGNOSIS — F0390 Unspecified dementia without behavioral disturbance: Secondary | ICD-10-CM | POA: Insufficient documentation

## 2014-11-07 DIAGNOSIS — I739 Peripheral vascular disease, unspecified: Secondary | ICD-10-CM | POA: Insufficient documentation

## 2014-11-07 DIAGNOSIS — E669 Obesity, unspecified: Secondary | ICD-10-CM | POA: Diagnosis not present

## 2014-11-07 DIAGNOSIS — I509 Heart failure, unspecified: Secondary | ICD-10-CM | POA: Diagnosis not present

## 2014-11-07 DIAGNOSIS — Z7901 Long term (current) use of anticoagulants: Secondary | ICD-10-CM | POA: Insufficient documentation

## 2014-11-07 DIAGNOSIS — I2510 Atherosclerotic heart disease of native coronary artery without angina pectoris: Secondary | ICD-10-CM | POA: Insufficient documentation

## 2014-11-07 DIAGNOSIS — Z85828 Personal history of other malignant neoplasm of skin: Secondary | ICD-10-CM | POA: Diagnosis not present

## 2014-11-07 DIAGNOSIS — Z794 Long term (current) use of insulin: Secondary | ICD-10-CM | POA: Diagnosis not present

## 2014-11-07 DIAGNOSIS — Z79899 Other long term (current) drug therapy: Secondary | ICD-10-CM | POA: Insufficient documentation

## 2014-11-07 DIAGNOSIS — G4730 Sleep apnea, unspecified: Secondary | ICD-10-CM | POA: Diagnosis not present

## 2014-11-07 MED ORDER — SODIUM CHLORIDE 0.9 % IV SOLN
Freq: Once | INTRAVENOUS | Status: AC
  Administered 2014-11-07: 09:00:00 via INTRAVENOUS

## 2014-11-07 MED ORDER — IOHEXOL 300 MG/ML  SOLN
30.0000 mL | Freq: Once | INTRAMUSCULAR | Status: DC | PRN
  Administered 2014-11-07: 10 mL
  Filled 2014-11-07: qty 30

## 2014-11-07 NOTE — Progress Notes (Signed)
Patient may stop Coumadin 3-4 days prior to Suprapubic catheter exchange on 11/07/14.  Does not have to stop Coumadin prior to next catheter exchange-per Dr. Markus Daft.

## 2014-11-07 NOTE — H&P (Signed)
Chief Complaint: Patient was seen in consultation today for suprapubic catheter exchange and upsizing at the request of [place] A  Referring Physician(s): McGowan,Shannon A  History of Present Illness: Bobby Jacobs is a 74 y.o. male with dementia and urinary retention.  Patient had a suprapubic catheter placed on 09/22/14.  Patient is scheduled for tube upsizing today.  Patient is here with caregiver.  Patient has no recent complaints but recently evaluated in ED for abdominal distention and hematuria.  No acute findings identified during ED visit.  Patient is on cipro for draingage around tube.  Coumadin held for the procedure.  Past Medical History  Diagnosis Date  . Hypertension   . Skin cancer   . Coronary artery disease   . Depression   . GERD (gastroesophageal reflux disease)   . Cardiovascular disease   . High cholesterol   . Pneumonia   . Fracture   . Obesity   . Sleep apnea   . CHF (congestive heart failure) ([place])   . Dementia   . Atrial fibrillation ([place])   . Urinary retention   . Urine frequency   . Incomplete bladder emptying   . Diabetes mellitus, type 2 ([place])   . Stroke ([place])   . Peripheral vascular disease ([place])   . Aortic aneurysm ([place])   . Aortic valve replaced   . Hyperthyroidism     Past Surgical History  Procedure Laterality Date  . Hernia repair    . Cardiac surgery    . Neck surgery    . Degenerative disk disease    . Circumcision    . Coronary artery bypass graft      Allergies: Review of patient's allergies indicates no known allergies.  Medications: Prior to Admission medications   Medication Sig Start Date End Date Taking? Authorizing Provider  amLODipine (NORVASC) 10 MG tablet Take 10 mg by mouth daily.   Yes Historical Provider, MD  atorvastatin (LIPITOR) 80 MG tablet Take 80 mg by mouth daily at 6 PM.    Yes Historical Provider, MD  Calcium Carb-Cholecalciferol (CALCIUM-VITAMIN D) 600-400 MG-UNIT TABS Take 1  tablet by mouth 2 (two) times daily.   Yes Historical Provider, MD  carbamide peroxide (DEBROX) 6.5 % otic solution Place 5 drops into both ears 2 (two) times a week.    Yes Historical Provider, MD  ciprofloxacin (CIPRO) 750 MG tablet Take 750 mg by mouth 2 (two) times daily.  10/24/14 11/14/14 Yes Historical Provider, MD  clotrimazole (LOTRIMIN) 1 % cream Apply 1 application topically 2 (two) times daily. APPLY TO PENIS 10/24/14 10/24/15 Yes Historical Provider, MD  gabapentin (NEURONTIN) 300 MG capsule Take 300 mg by mouth 3 (three) times daily.   Yes Historical Provider, MD  insulin lispro (HUMALOG) 100 UNIT/ML injection Inject 4 Units into the skin 3 (three) times daily before meals. HOLD IF CBG <80   Yes Historical Provider, MD  losartan (COZAAR) 100 MG tablet Take 100 mg by mouth daily.  07/19/14  Yes Historical Provider, MD  metoprolol (LOPRESSOR) 50 MG tablet Take 50 mg by mouth 2 (two) times daily.   Yes Historical Provider, MD  Miconazole Nitrate (FUNGOID TINCTURE) 2 % SOLN Apply 1 application topically 2 (two) times daily as needed (FOR AFFECTED AREAS).   Yes Historical Provider, MD  Misc Natural Products (FIBER 7) POWD Take 1 scoop by mouth 3 (three) times daily.   Yes Historical Provider, MD  Multiple Vitamins-Minerals (PRESERVISION AREDS 2) CAPS Take 1 capsule by mouth  2 (two) times daily.   Yes Historical Provider, MD  polyethylene glycol (MIRALAX / GLYCOLAX) packet Take 17 g by mouth 2 (two) times daily. Hold if loose stools 11/06/14  Yes Forde Dandy, MD  PRESCRIPTION MEDICATION Inhale 1 application into the lungs at bedtime. BIPAP   Yes Historical Provider, MD  psyllium (METAMUCIL SMOOTH TEXTURE) 28 % packet Take 1 packet by mouth 3 (three) times daily. HOLD FOR DIARRHEA   Yes Historical Provider, MD  QUEtiapine (SEROQUEL) 50 MG tablet Take 50 mg by mouth at bedtime.   Yes Historical Provider, MD  ranitidine (ZANTAC) 150 MG capsule Take 150 mg by mouth every morning.    Yes Historical  Provider, MD  TOUJEO SOLOSTAR 300 UNIT/ML SOPN Take 38 Units by mouth every morning.  09/10/14  Yes Historical Provider, MD  traZODone (DESYREL) 100 MG tablet Take 150 mg by mouth at bedtime.    Yes Historical Provider, MD  vitamin B-12 (CYANOCOBALAMIN) 1000 MCG tablet Take 1,000 mcg by mouth daily.   Yes Historical Provider, MD  fosfomycin (MONUROL) 3 G PACK Take 3 g by mouth once. Patient not taking: Reported on 10/27/2014 10/17/14   Larene Beach A McGowan, PA-C  warfarin (COUMADIN) 7.5 MG tablet Take 7.5 mg by mouth daily.    Historical Provider, MD     Family History  Problem Relation Age of Onset  . Cancer Neg Hx     Kidney,Bladder ,Prostate    Social History   Social History  . Marital Status: Married    Spouse Name: N/A  . Number of Children: N/A  . Years of Education: N/A   Social History Main Topics  . Smoking status: Former Research scientist (life sciences)  . Smokeless tobacco: Former Systems developer    Quit date: 09/21/2012     Comment: quit one year ago  . Alcohol Use: No  . Drug Use: No  . Sexual Activity: Not Asked   Other Topics Concern  . None   Social History Narrative   Married with 2 children   Right handed   college   3-4 cups daily       Review of Systems  Genitourinary: Positive for difficulty urinating.    Vital Signs: BP 160/68 mmHg  Pulse 68  Temp(Src) 98.3 F (36.8 C) (Oral)  Resp 18  SpO2 96%  Physical Exam  Cardiovascular: Normal rate, regular rhythm and normal heart sounds.   Pulmonary/Chest: Effort normal and breath sounds normal.  Abdominal: Soft. Bowel sounds are normal.  Genitourinary:  Suprapubic catheter in intact.  No significant drainage around catheter.   Neurological:  Patient is alert but does not understand why he is at hospital.    Mallampati Score:  MD Evaluation Airway: WNL Heart: WNL Abdomen: WNL Chest/ Lungs: WNL ASA  Classification: 2 Mallampati/Airway Score: Three  Imaging: Dg Chest 2 View  11/05/2014  CLINICAL DATA:  Distended  abdomen, hematuria, cough. Suprapubic tube. EXAM: CHEST  2 VIEW COMPARISON:  08/03/2013 FINDINGS: Postoperative changes in the mediastinum. Shallow inspiration. Normal heart size and pulmonary vascularity. Slight linear atelectasis in the lung bases. No focal airspace disease or consolidation in the lungs. No blunting of costophrenic angles. No pneumothorax. Mediastinal contours appear intact. Calcified and tortuous aorta. Degenerative changes in the spine and shoulders. IMPRESSION: No active cardiopulmonary disease. Electronically Signed   By: Lucienne Capers M.D.   On: 11/05/2014 22:36   Dg Abd 2 Views  11/05/2014  CLINICAL DATA:  74 year old male with abdominal distention and hematuria. Patient with suprapubic catheter.  EXAM: ABDOMEN - 2 VIEW COMPARISON:  08/04/2014 CT FINDINGS: A moderate to large amount of colonic and rectal stool identified. There is no evidence of small bowel obstruction or pneumoperitoneum. The catheter overlying the pelvis is present. No suspicious calcifications are identified. Degenerative changes in the lumbar spine again noted. IMPRESSION: No evidence of acute abnormality. Moderate to large amount of colonic and rectal stool. Catheter overlying the pelvis. Electronically Signed   By: Margarette Canada M.D.   On: 11/05/2014 22:37    Labs:  CBC:  Recent Labs  11/28/13 1602 09/22/14 0833 11/05/14 2157  WBC 10.1 10.0 12.2*  HGB 13.8 13.4 12.9*  HCT 42.2 40.4 38.7*  PLT 208 180 210    COAGS:  Recent Labs  11/28/13 1602 09/22/14 0833 11/05/14 2157  INR 2.0 1.19 2.69*  APTT  --  29  --     BMP:  Recent Labs  11/28/13 1602 11/05/14 2157  NA 144 135  K 3.9 3.9  CL 108* 101  CO2 32 25  GLUCOSE 145* 113*  BUN 26* 24*  CALCIUM 8.1* 8.7*  CREATININE 1.38* 1.37*  GFRNONAA 53* 49*  GFRAA >60 56*    LIVER FUNCTION TESTS:  Recent Labs  11/28/13 1602  BILITOT 0.8  AST 24  ALT 20  ALKPHOS 117*  PROT 7.3  ALBUMIN 3.8    TUMOR MARKERS: No results  for input(s): AFPTM, CEA, CA199, CHROMGRNA in the last 8760 hours.  Assessment and Plan:  74 yo with suprapubic catheter due to urinary retention.  Scheduled for tube upsizing.  Procedure discussed with patient's wife on phone and caregiver.  Informed consent obtained from wife.  Plan for exchange with fluoroscopic guidance.  Will give pain medications if needed.    SignedCarylon Perches 11/07/2014, 9:54 AM

## 2014-11-07 NOTE — Procedures (Signed)
Successful exchange of suprapubic catheter to 14 Fr.  No immediate complication.  Plan for upsize to 16 Fr. Foley catheter in one month.

## 2014-11-09 LAB — URINE CULTURE: Culture: >=100000

## 2014-11-10 ENCOUNTER — Telehealth (HOSPITAL_BASED_OUTPATIENT_CLINIC_OR_DEPARTMENT_OTHER): Payer: Self-pay | Admitting: Emergency Medicine

## 2014-11-10 NOTE — Progress Notes (Signed)
ED Antimicrobial Stewardship Positive Culture Follow Up   Bobby Jacobs is an 74 y.o. male who presented to [facility] on 11/05/2014 with a chief complaint of  Chief Complaint  Patient presents with  . Hematuria  . Urinary Retention    Recent Results (from the past 720 hour(s))  Urine culture     Status: None   Collection Time: 11/05/14 10:50 PM  Result Value Ref Range Status   Specimen Description URINE, SUPRAPUBIC  Final   Special Requests NONE  Final   Culture >=100,000 COLONIES/mL PROTEUS MIRABILIS  Final   Report Status 11/09/2014 FINAL  Final   Organism ID, Bacteria PROTEUS MIRABILIS  Final      Susceptibility   Proteus mirabilis - MIC*    AMPICILLIN >=32 RESISTANT Resistant     CEFAZOLIN 8 SENSITIVE Sensitive     CEFTRIAXONE <=1 SENSITIVE Sensitive     CIPROFLOXACIN >=4 RESISTANT Resistant     GENTAMICIN >=16 RESISTANT Resistant     IMIPENEM 1 SENSITIVE Sensitive     NITROFURANTOIN 128 RESISTANT Resistant     TRIMETH/SULFA >=320 RESISTANT Resistant     AMPICILLIN/SULBACTAM >=32 RESISTANT Resistant     PIP/TAZO <=4 SENSITIVE Sensitive     * >=100,000 COLONIES/mL PROTEUS MIRABILIS    [x]  Treated with Cipro, organism resistant to prescribed antimicrobial []  Patient discharged originally without antimicrobial agent and treatment is now indicated  New antibiotic prescription: Keflex 500mg  Q12 x 14 days  ED Provider: Ottie Glazier, PA-C  Assesment: 40 yoM with dementia. Presenting to the ED with CC of hematuria and urinary retention. Pt has suprapubic catheter and was on Cipro prior to ED visit for drainage around tube. Plan following ED visit was to continue Cipro. UCx now growing Cipro resistant organism. Plan to stop Cipro and start Keflex 500mg  Q12 x 14 days.   Darl Pikes, PharmD Clinical Pharmacist- Resident Pager: [phone]  Darl Pikes 11/10/2014, 8:51 AM Infectious Diseases Pharmacist Phone# [phone]

## 2014-11-12 ENCOUNTER — Telehealth (HOSPITAL_BASED_OUTPATIENT_CLINIC_OR_DEPARTMENT_OTHER): Payer: Self-pay | Admitting: Emergency Medicine

## 2014-11-12 NOTE — Telephone Encounter (Signed)
Post ED Visit - Positive Culture Follow-up: Successful Patient Follow-Up  Culture assessed and recommendations reviewed by: []  Heide Guile, Pharm.D., BCPS-AQ ID []  Alycia Rossetti, Pharm.D., BCPS []  [place], Pharm.D., BCPS, AAHIVP []  Legrand Como, Pharm.D., BCPS, AAHIVP []  [place], Pharm.D. []  Cassie Nicole Kindred, [place].D. Angela Burke PharmD  Positive urine culture  Proteus   []  Patient discharged without antimicrobial prescription and treatment is now indicated [x]  Organism is resistant to prescribed ED discharge antimicrobial []  Patient with positive blood cultures  Changes discussed with ED provider: Ottie Glazier PA New antibiotic prescription stop cipro , start keflex 500mg  po q 12 hours x 14 days Called to [facility] caregiver Arlie Solomons @ [phone]   Hazle Nordmann 11/12/2014, 11:13 AM

## 2014-11-20 ENCOUNTER — Other Ambulatory Visit: Payer: Self-pay

## 2014-11-20 DIAGNOSIS — Z960 Presence of urogenital implants: Principal | ICD-10-CM

## 2014-11-20 DIAGNOSIS — Z978 Presence of other specified devices: Secondary | ICD-10-CM

## 2014-11-20 MED ORDER — BARD PLASTIC TUBING/ADAPTER MISC: Status: DC

## 2014-12-12 ENCOUNTER — Ambulatory Visit
Admission: RE | Admit: 2014-12-12 | Discharge: 2014-12-12 | Disposition: A | Discharge status: Home / Self Care | Payer: Medicare Other | Source: Ambulatory Visit | Attending: Urology | Admitting: Urology

## 2014-12-12 DIAGNOSIS — R339 Retention of urine, unspecified: Secondary | ICD-10-CM | POA: Diagnosis not present

## 2014-12-12 LAB — CBC
HEMATOCRIT: 40.4 % (ref 40.0–52.0)
HEMOGLOBIN: 13.1 g/dL (ref 13.0–18.0)
MCH: 28.1 pg (ref 26.0–34.0)
MCHC: 32.4 g/dL (ref 32.0–36.0)
MCV: 86.7 fL (ref 80.0–100.0)
Platelets: 223 10*3/uL (ref 150–440)
RBC: 4.66 MIL/uL (ref 4.40–5.90)
RDW: 14.4 % (ref 11.5–14.5)
WBC: 9.3 10*3/uL (ref 3.8–10.6)

## 2014-12-12 LAB — BASIC METABOLIC PANEL
ANION GAP: 8 (ref 5–15)
BUN: 22 mg/dL — AB (ref 6–20)
CO2: 25 mmol/L (ref 22–32)
Calcium: 9.3 mg/dL (ref 8.9–10.3)
Chloride: 107 mmol/L (ref 101–111)
Creatinine, Ser: 1.23 mg/dL (ref 0.61–1.24)
GFR calc Af Amer: >60 mL/min (ref >60)
GFR, EST NON AFRICAN AMERICAN: 55 mL/min — AB (ref >60)
GLUCOSE: 138 mg/dL — AB (ref 65–99)
POTASSIUM: 4.1 mmol/L (ref 3.5–5.1)
Sodium: 140 mmol/L (ref 135–145)

## 2014-12-12 LAB — PROTIME-INR
INR: 2.3
Prothrombin Time: 25.1 seconds — ABNORMAL HIGH (ref 11.4–15.0)

## 2014-12-12 LAB — APTT: APTT: 33 s (ref 24–36)

## 2014-12-12 MED ORDER — SODIUM CHLORIDE 0.9 % IV SOLN
INTRAVENOUS | Status: DC
  Administered 2014-12-12: 08:00:00 via INTRAVENOUS

## 2014-12-12 MED ORDER — IOHEXOL 300 MG/ML  SOLN
30.0000 mL | Freq: Once | INTRAMUSCULAR | Status: DC | PRN
  Administered 2014-12-12: 10 mL
  Filled 2014-12-12: qty 30

## 2014-12-12 MED ORDER — LIDOCAINE HCL (PF) 1 % IJ SOLN
INTRAMUSCULAR | Status: DC | PRN
  Administered 2014-12-12: 10 mL

## 2014-12-12 NOTE — Procedures (Addendum)
Suprapubic catheter exchange without difficulty with upsize to [id number] Foley  Complications:  None  Blood Loss: none  See dictation in canopy pacs

## 2014-12-24 ENCOUNTER — Telehealth: Payer: Self-pay

## 2014-12-24 NOTE — Telephone Encounter (Signed)
Pt caregiver, Manuela Schwartz, faxed over orders for sp tube flushes bid be d/c and made prn. Also orders for legs bags so they can get them through their pharmacy. Larene Beach signed both orders and they were faxed back.

## 2015-01-09 ENCOUNTER — Encounter: Payer: Self-pay | Admitting: *Deleted

## 2015-01-12 ENCOUNTER — Telehealth: Payer: Self-pay

## 2015-01-12 NOTE — Telephone Encounter (Signed)
Margreta Journey called stating pt had 47fr SP tube placed and she was there to change tube. Per Margreta Journey only 3cc of sterile water was removed from balloon and a lot of resistance is there when attempting to remove foley. Nurse could hear pt c/o pain when Margreta Journey was attempting to remove tube. Due to pt having an appt on Wednesday nurse requested Christina secure SP tube and replace 3cc of water until office visit. Christina voiced understanding.

## 2015-01-14 ENCOUNTER — Encounter: Payer: Self-pay | Admitting: Urology

## 2015-01-14 ENCOUNTER — Ambulatory Visit (INDEPENDENT_AMBULATORY_CARE_PROVIDER_SITE_OTHER): Payer: Medicare Other | Admitting: Urology

## 2015-01-14 VITALS — BP 152/79 | HR 78 | Ht 71.0 in | Wt 195.0 lb

## 2015-01-14 DIAGNOSIS — N390 Urinary tract infection, site not specified: Secondary | ICD-10-CM

## 2015-01-14 DIAGNOSIS — R32 Unspecified urinary incontinence: Secondary | ICD-10-CM | POA: Diagnosis not present

## 2015-01-14 DIAGNOSIS — R339 Retention of urine, unspecified: Secondary | ICD-10-CM

## 2015-01-14 NOTE — Progress Notes (Signed)
10:32 AM   Zakhar Wilinski [facility] 10-20-1940 [id number]  Referring provider: [name], PA [address] [place], [place] [address]  Chief Complaint  Patient presents with  . Urinary Retention    SPT change    HPI: [name] is a 74 year old white male with a history of urinary retention and urinary incontinence who presents today for a SPT exchange in the office.  Patient's staff had a difficult time removing the SPT and requested for [place] to do it today in the office.    Background story Patient has dementia and is unable to recognize when he needs to urinate. He is having overflow incontinence.  Patient's PCP has been managing his urinary tract infections.  Patient's had 3 urinary tract infections since September. The first UTI was positive for Pseudomonas in the last 2 were positive for Proteus. There is an increasing resistance pattern.  He has been seen by ID as well.    PMH: Past Medical History  Diagnosis Date  . Hypertension   . Skin cancer   . Coronary artery disease   . Depression   . GERD (gastroesophageal reflux disease)   . Cardiovascular disease   . High cholesterol   . Pneumonia   . Fracture   . Obesity   . Sleep apnea   . CHF (congestive heart failure) ([place])   . Dementia   . Atrial fibrillation ([place])   . Urinary retention   . Urine frequency   . Incomplete bladder emptying   . Diabetes mellitus, type 2 ([place])   . Stroke ([place])   . Peripheral vascular disease ([place])   . Aortic aneurysm ([place])   . Aortic valve replaced   . Hyperthyroidism     Surgical History: Past Surgical History  Procedure Laterality Date  . Hernia repair    . Cardiac surgery    . Neck surgery    . Degenerative disk disease    . Circumcision    . Coronary artery bypass graft    . Suprapubic catheter surgery  09/22/2014    placement    Home Medications:    Medication List       This list is accurate as of: 01/14/15 10:32 AM.  Always use your most recent med list.                 amLODipine 10 MG tablet  Commonly known as:  NORVASC  Take 10 mg by mouth daily.     amoxicillin-clavulanate 875-125 MG tablet  Commonly known as:  AUGMENTIN     atorvastatin 80 MG tablet  Commonly known as:  LIPITOR  Take 80 mg by mouth daily at 6 PM.     BARD PLASTIC TUBING/ADAPTER Misc  Use with foley cath     Calcium-Vitamin D 600-400 MG-UNIT Tabs  Take 1 tablet by mouth 2 (two) times daily.     carbamide peroxide 6.5 % otic solution  Commonly known as:  DEBROX  Place 5 drops into both ears 2 (two) times a week.     cephALEXin 500 MG capsule  Commonly known as:  KEFLEX  Take 500 mg by mouth 4 (four) times daily. Reported on 01/14/2015     clotrimazole 1 % cream  Commonly known as:  LOTRIMIN  Apply 1 application topically 2 (two) times daily. APPLY TO PENIS     FIBER 7 Powd  Take 1 scoop by mouth 3 (three) times daily.     fosfomycin 3 G Pack  Commonly known as:  MONUROL  Take 3 g by mouth once.     FUNGOID TINCTURE 2 % Soln  Generic drug:  Miconazole Nitrate  Apply 1 application topically 2 (two) times daily as needed (FOR AFFECTED AREAS).     gabapentin 300 MG capsule  Commonly known as:  NEURONTIN  Take 300 mg by mouth 3 (three) times daily.     insulin lispro 100 UNIT/ML injection  Commonly known as:  HUMALOG  Inject 5 Units into the skin 3 (three) times daily before meals. HOLD IF CBG <80     losartan 100 MG tablet  Commonly known as:  COZAAR  Take 100 mg by mouth daily.     metoprolol 50 MG tablet  Commonly known as:  LOPRESSOR  Take 50 mg by mouth 2 (two) times daily.     polyethylene glycol packet  Commonly known as:  MIRALAX / GLYCOLAX  Take 17 g by mouth 2 (two) times daily. Hold if loose stools     PRESCRIPTION MEDICATION  Inhale 1 application into the lungs at bedtime. BIPAP     PRESERVISION AREDS 2 Caps  Take 1 capsule by mouth 2 (two) times daily.     psyllium 28 % packet  Commonly known as:  METAMUCIL SMOOTH  TEXTURE  Take 1 packet by mouth 3 (three) times daily. HOLD FOR DIARRHEA     QUEtiapine 50 MG tablet  Commonly known as:  SEROQUEL  Take 50 mg by mouth at bedtime.     ranitidine 150 MG capsule  Commonly known as:  ZANTAC  Take 150 mg by mouth every morning.     TOUJEO SOLOSTAR 300 UNIT/ML Sopn  Generic drug:  Insulin Glargine  Take 43 Units by mouth every morning.     traZODone 100 MG tablet  Commonly known as:  DESYREL  Take 150 mg by mouth at bedtime.     vitamin B-12 1000 MCG tablet  Commonly known as:  CYANOCOBALAMIN  Take 1,000 mcg by mouth daily.     warfarin 7.5 MG tablet  Commonly known as:  COUMADIN  Take 7.5 mg by mouth daily.        Allergies: No Known Allergies  Family History: Family History  Problem Relation Age of Onset  . Cancer Neg Hx     Kidney,Bladder ,Prostate    Social History:  reports that he has quit smoking. He quit smokeless tobacco use about 2 years ago. He reports that he does not drink alcohol or use illicit drugs.  ROS: Urological Symptom Review  Patient is experiencing the following symptoms: Frequent urination Hard to postpone urination Get up at night to urinate Leakage of urine   Review of Systems  Gastrointestinal (upper)  : Negative for upper GI symptoms  Gastrointestinal (lower) : Constipation  Constitutional : Negative for symptoms  Skin: Negative for skin symptoms  Eyes: Negative for eye symptoms  Ear/Nose/Throat : Negative for Ear/Nose/Throat symptoms  Hematologic/Lymphatic: Negative for Hematologic/Lymphatic symptoms  Cardiovascular : Negative for cardiovascular symptoms  Respiratory : Shortness of breath  Endocrine: Excessive thirst  Musculoskeletal: Back pain  Neurological: Negative for neurological symptoms  Psychologic: Negative for psychiatric symptoms   Physical Exam: BP 152/79 mmHg  Pulse 78  Ht 5\' 11"  (1.803 m)  Wt 195 lb (88.451 kg)  BMI 27.21 kg/m2  Gastrointestinal:  Soft with faint LLQ tenderness, no rebound or guarding. No distention. No abdominal bruits. No CVA tenderness.  SPT site is clean and dry.  A scant  amount of granulation tissue is present. Genitourinary: GU: Patient with uncircumcised phallus.  Foreskin easily retracted  Urethral meatus is patent.  No penile discharge. No penile lesions or rashes. Scrotum without lesions, cysts, rashes and/or edema.  Testicles are located scrotally bilaterally. No masses are appreciated in the testicles. Left and right epididymis are normal.  Laboratory Data:  Lab Results  Component Value Date   WBC 9.3 12/12/2014   HGB 13.1 12/12/2014   HCT 40.4 12/12/2014   MCV 86.7 12/12/2014   PLT 223 12/12/2014    Lab Results  Component Value Date   CREATININE 1.23 12/12/2014    Suprapubic Cath Change  Patient is present today for a suprapubic catheter change due to urinary retention.  8 ml of water was drained from the balloon, a 16 FR foley cath was removed from the tract with out difficulty.  Site was cleaned and prepped in a sterile fashion with betadine.  A 16 FR foley cath was replaced into the tract no complications were noted. Urine return was noted, 10 ml of sterile water was inflated into the balloon and a leg bag was attached for drainage.  Patient tolerated well. A night bag was given to patient and proper instruction was given on how to switch bags.    Preformed by: Zara Council, PA-C  Assessment & Plan:    1. Urinary retention:   Patient's SPT is exchanged without difficulty. They will continue to exchange it monthly at his residence. They will contact [place] with any difficulty. He will follow-up in one year.  2. Urinary incontinence:   We will manage with SPT placement.  3. Recurrent UTI"s:   Patient's caregiver is reminded that the patient is most likely colonized due to the SPT. A culture should only be performed with the patient is exhibiting mental status changes, fever, and increase in blood  pressure, suprapubic discomfort or hematuria.  His PCP is currently managing the infections.   Zara Council, [facility] [address], [place], [place] [address] [phone]

## 2015-01-30 ENCOUNTER — Encounter: Payer: Self-pay | Admitting: Urology

## 2015-01-30 ENCOUNTER — Ambulatory Visit (INDEPENDENT_AMBULATORY_CARE_PROVIDER_SITE_OTHER): Payer: Medicare Other | Admitting: Urology

## 2015-01-30 ENCOUNTER — Encounter: Payer: Self-pay | Admitting: Internal Medicine

## 2015-01-30 VITALS — BP 147/82 | HR 89 | Ht 69.0 in | Wt 195.0 lb

## 2015-01-30 DIAGNOSIS — R339 Retention of urine, unspecified: Secondary | ICD-10-CM | POA: Diagnosis not present

## 2015-01-30 DIAGNOSIS — N390 Urinary tract infection, site not specified: Secondary | ICD-10-CM | POA: Diagnosis not present

## 2015-01-30 DIAGNOSIS — R32 Unspecified urinary incontinence: Secondary | ICD-10-CM | POA: Diagnosis not present

## 2015-01-30 MED ORDER — CRANBERRY 250 MG PO TABS
ORAL_TABLET | ORAL | Status: DC
Start: 2015-01-30 — End: 2015-02-20

## 2015-01-30 MED ORDER — ACIDOPHILUS PROBIOTIC 100 MG PO CAPS: ORAL_CAPSULE | ORAL | Status: DC

## 2015-01-30 MED ORDER — SODIUM CHLORIDE 0.9 % IJ SOLN
50.0000 mL | Freq: Once | INTRAMUSCULAR | Status: AC
  Administered 2015-01-30: 50 mL

## 2015-01-30 MED ORDER — GENTAMICIN SULFATE 40 MG/ML IJ SOLN
80.0000 mg | Freq: Once | INTRAMUSCULAR | Status: AC
  Administered 2015-01-30: 80 mg

## 2015-01-30 NOTE — Progress Notes (Signed)
Bladder Instillation  Patient is present today for a Bladder instillation of Gentamicin for treatment of UTI. Patient has a supra pubic tube and per Ellwood Handler getntamicin 80mg  was to be mixed with 37ml of Sodium Chloride and instilled into patient's bladder through SP Tube and plugged for 15min. Patient was kept in the office for this time.  Preformed By : Fonnie Jarvis, CMA

## 2015-01-31 NOTE — Progress Notes (Signed)
11:38 AM   Bobby Jacobs [birth year] [id number]  Referring provider: [name], PA [address] [place], [place] [address]  Chief Complaint  Patient presents with  . Urinary Tract Infection    HPI: Patient is a 75 year old Caucasian male who presents today at the request of his primary care provider, Bobby Jacobs. Turner PA,  for recurrent urinary tract infections.  Background story Patient has dementia and is unable to recognize when he needs to urinate. He was having overflow incontinence and breakdown in the skin of his perineum.   He could not tolerate an indwelling Foley due to his dementia.  He had traumatically removed the Foley at one time.  He now has a suprapubic tube in place.   Patient has had several positive urine cultures   I have discussed with his primary caregiver the phenomenon of colonization and not to address any positive urine cultures unless there are signs of infection.   Patient's caregiver states that when he does have a urinary tract infection he has increased lower extremity weakness and his dementia is worsened.   His primary care provider's office is currently trying to obtain a prior authorization for the antibiotic fosfomycin, but they have been unsuccessful.   He has been evaluated by infectious disease as well.  Today, his urine is cloudy and has a very foul odor.  Caregiver states that he is been extremely weak.  His most recent culture from 12/30/2014 was positive for Proteus mirabilis and Citrobacter freundii.    PMH: Past Medical History  Diagnosis Date  . Hypertension   . Skin cancer   . Coronary artery disease   . Depression   . GERD (gastroesophageal reflux disease)   . Cardiovascular disease   . High cholesterol   . Pneumonia   . Fracture   . Obesity   . Sleep apnea   . CHF (congestive heart failure) ([place])   . Dementia   . Atrial fibrillation ([place])   . Urinary retention   . Urine frequency   . Incomplete bladder emptying   .  Diabetes mellitus, type 2 ([place])   . Stroke ([place])   . Peripheral vascular disease ([place])   . Aortic aneurysm ([place])   . Aortic valve replaced   . Hyperthyroidism     Surgical History: Past Surgical History  Procedure Laterality Date  . Hernia repair    . Cardiac surgery    . Neck surgery    . Degenerative disk disease    . Circumcision    . Coronary artery bypass graft    . Suprapubic catheter surgery  09/22/2014    placement    Home Medications:    Medication List       This list is accurate as of: 01/30/15 11:59 PM.  Always use your most recent med list.               ACIDOPHILUS PROBIOTIC 100 MG Caps  Take on tablet twice daily     amLODipine 10 MG tablet  Commonly known as:  NORVASC  Take 10 mg by mouth daily.     amoxicillin-clavulanate 875-125 MG tablet  Commonly known as:  AUGMENTIN  Reported on 01/30/2015     atorvastatin 80 MG tablet  Commonly known as:  LIPITOR  Take 80 mg by mouth daily at 6 PM.     BARD PLASTIC TUBING/ADAPTER Misc  Use with foley cath     Calcium-Vitamin D 600-400 MG-UNIT Tabs  Take  1 tablet by mouth 2 (two) times daily.     carbamide peroxide 6.5 % otic solution  Commonly known as:  DEBROX  Place 5 drops into both ears 2 (two) times a week.     cephALEXin 500 MG capsule  Commonly known as:  KEFLEX  Take 500 mg by mouth 4 (four) times daily. Reported on 01/30/2015     clotrimazole 1 % cream  Commonly known as:  LOTRIMIN  Apply 1 application topically 2 (two) times daily. APPLY TO PENIS     Cranberry 250 MG Tabs  Take by mouth twice daily     FIBER 7 Powd  Take 1 scoop by mouth 3 (three) times daily.     fosfomycin 3 g Pack  Commonly known as:  MONUROL  Take 3 g by mouth once.     FUNGOID TINCTURE 2 % Soln  Generic drug:  Miconazole Nitrate  Apply 1 application topically 2 (two) times daily as needed (FOR AFFECTED AREAS).     gabapentin 300 MG capsule  Commonly known as:  NEURONTIN  Take 300 mg by mouth 3 (three)  times daily.     HUMALOG KWIKPEN 100 UNIT/ML KiwkPen  Generic drug:  insulin lispro     losartan 100 MG tablet  Commonly known as:  COZAAR  Take 100 mg by mouth daily.     metoprolol 50 MG tablet  Commonly known as:  LOPRESSOR  Take 50 mg by mouth 2 (two) times daily.     polyethylene glycol packet  Commonly known as:  MIRALAX / GLYCOLAX  Take 17 g by mouth 2 (two) times daily. Hold if loose stools     PRESCRIPTION MEDICATION  Inhale 1 application into the lungs at bedtime. BIPAP     PRESERVISION AREDS 2 Caps  Take 1 capsule by mouth 2 (two) times daily.     psyllium 28 % packet  Commonly known as:  METAMUCIL SMOOTH TEXTURE  Take 1 packet by mouth 3 (three) times daily. HOLD FOR DIARRHEA     QUEtiapine 50 MG tablet  Commonly known as:  SEROQUEL  Take 50 mg by mouth at bedtime.     ranitidine 150 MG capsule  Commonly known as:  ZANTAC  Take 150 mg by mouth every morning.     TOUJEO SOLOSTAR 300 UNIT/ML Sopn  Generic drug:  Insulin Glargine  Take 43 Units by mouth every morning.     traZODone 100 MG tablet  Commonly known as:  DESYREL  Take 150 mg by mouth at bedtime.     vitamin B-12 1000 MCG tablet  Commonly known as:  CYANOCOBALAMIN  Take 1,000 mcg by mouth daily.     warfarin 7.5 MG tablet  Commonly known as:  COUMADIN  Take 7.5 mg by mouth daily.        Allergies: No Known Allergies  Family History: Family History  Problem Relation Age of Onset  . Cancer Neg Hx     Kidney,Bladder ,Prostate    Social History:  reports that he has quit smoking. He quit smokeless tobacco use about 2 years ago. He reports that he does not drink alcohol or use illicit drugs.  ROS: Urological Symptom Review  Patient is experiencing the following symptoms: Frequent urination Hard to postpone urination Get up at night to urinate Leakage of urine   Review of Systems  Gastrointestinal (upper)  : Negative for upper GI symptoms  Gastrointestinal (lower)  : Constipation  Constitutional : Negative for symptoms  Skin: Negative for skin symptoms  Eyes: Negative for eye symptoms  Ear/Nose/Throat : Negative for Ear/Nose/Throat symptoms  Hematologic/Lymphatic: Negative for Hematologic/Lymphatic symptoms  Cardiovascular : Negative for cardiovascular symptoms  Respiratory : Shortness of breath  Endocrine: Excessive thirst  Musculoskeletal: Back pain  Neurological: Negative for neurological symptoms  Psychologic: Negative for psychiatric symptoms   Physical Exam: BP 147/82 mmHg  Pulse 89  Ht 5\' 9"  (1.753 m)  Wt 195 lb (88.451 kg)  BMI 28.78 kg/m2  Gastrointestinal: Soft with faint LLQ tenderness, no rebound or guarding. No distention. No abdominal bruits. No CVA tenderness.  SPT site is clean and dry.  A scant amount of granulation tissue is present. Genitourinary: GU: Patient with uncircumcised phallus.  Foreskin easily retracted  Urethral meatus is patent.  No penile discharge. No penile lesions or rashes. Scrotum without lesions, cysts, rashes and/or edema.  Testicles are located scrotally bilaterally. No masses are appreciated in the testicles. Left and right epididymis are normal.  Laboratory Data:  Lab Results  Component Value Date   WBC 9.3 12/12/2014   HGB 13.1 12/12/2014   HCT 40.4 12/12/2014   MCV 86.7 12/12/2014   PLT 223 12/12/2014    Lab Results  Component Value Date   CREATININE 1.23 12/12/2014     Assessment & Plan:    1. Urinary retention:   Patient's SPT is exchanged without difficulty. They will continue to exchange it monthly at his residence. They will contact [place] with any difficulty. He will follow-up in one year.  2. Urinary incontinence:   We will manage with SPT placement.  3. Recurrent UTI's:  We will institute bladder washings with gentamicin solution today.  The PCP's office states they will attempt to prior authorization for the fosfomycin again today.  We will perform these  bladder washings twice weekly for 3 weeks and then reassess.    Zara Council, [facility] [address], [place], [place] [address] [phone]

## 2015-02-03 ENCOUNTER — Ambulatory Visit: Payer: Medicare Other | Admitting: Urology

## 2015-02-03 ENCOUNTER — Encounter: Payer: Self-pay | Admitting: Urology

## 2015-02-05 ENCOUNTER — Ambulatory Visit (INDEPENDENT_AMBULATORY_CARE_PROVIDER_SITE_OTHER): Payer: Medicare Other | Admitting: Urology

## 2015-02-05 ENCOUNTER — Encounter: Payer: Self-pay | Admitting: Urology

## 2015-02-05 VITALS — BP 175/79 | HR 76 | Ht 69.0 in

## 2015-02-05 DIAGNOSIS — N390 Urinary tract infection, site not specified: Secondary | ICD-10-CM | POA: Diagnosis not present

## 2015-02-05 DIAGNOSIS — R32 Unspecified urinary incontinence: Secondary | ICD-10-CM

## 2015-02-05 DIAGNOSIS — R339 Retention of urine, unspecified: Secondary | ICD-10-CM | POA: Diagnosis not present

## 2015-02-05 MED ORDER — GENTAMICIN SULFATE 40 MG/ML IJ SOLN
80.0000 mg | Freq: Once | INTRAMUSCULAR | Status: AC
  Administered 2015-02-05: 80 mg

## 2015-02-05 MED ORDER — SODIUM CHLORIDE 0.9 % IJ SOLN
50.0000 mL | Freq: Once | INTRAMUSCULAR | Status: AC
  Administered 2015-02-05: 50 mL

## 2015-02-05 NOTE — Progress Notes (Signed)
Bladder Rescue Solution Instillation  Due to recurrent urinary tract infections patient is present today for a Gentamicin Solution Treatment.  Patient was cleaned and prepped in a sterile fashion with betadine, patient has a supra pubic tube that solution was instilled through and plugged to hold solution in the bladder for 45 minutes. Patient tolerated well, no complications were noted.  Performed by: Lyndee Hensen CMA  Follow up/ Additional Notes: One week   Cath Change/ Replacement  Patient is present today Gentamicin instillation, upon arriving aide complained patient's supra pubic tube was clogged again. Tried to flush catheter but it was blocked so the tube was replaced.  90ml of water was removed from the balloon, a 16FR foley cath was removed with out difficulty.  Patient was cleaned and prepped in a sterile fashion with betadine a 16 FR foley cath was replaced into the bladder no complications were noted. Urine return was noted 250 ml and urine was clear and yellow in color. The balloon was filled with 34ml of sterile water. Patient  bag was attached back for drainage.  Patient was given proper instruction on catheter care.    Preformed by: Lyndee Hensen CMA  Follow up: One month

## 2015-02-05 NOTE — Progress Notes (Signed)
3:14 PM   [facility] 08-30-1940 [id number]  Referring provider: [name], PA [address] [place], [place] [address]  Chief Complaint  Patient presents with  . Urinary Retention    Bladder Instillation  . Recurrent UTI    HPI: Patient is a 75 year old Caucasian male who presents today for his second bladder instillation.    Background story Patient has dementia and is unable to recognize when he needs to urinate. He was having overflow incontinence and breakdown in the skin of his perineum.   He could not tolerate an indwelling Foley due to his dementia.  He had traumatically removed the Foley at one time.  He now has a suprapubic tube in place.   Patient has had several positive urine cultures   I have discussed with his primary caregiver the phenomenon of colonization and not to address any positive urine cultures unless there are signs of infection.   Patient's caregiver states that when he does have a urinary tract infection he has increased lower extremity weakness and his dementia is worsened.   His primary care provider's office is currently trying to obtain a prior authorization for the antibiotic fosfomycin, but they have been unsuccessful.   He has been evaluated by infectious disease as well.  Today, his urine is SPT will not irrigate.   He tolerated the first bladder instillation.   His most recent culture from 12/30/2014 was positive for Proteus mirabilis and Citrobacter freundii.  His caregivers husband has brought him to his appointment today and he is not sure if he has received the fosfomycin.    PMH: Past Medical History  Diagnosis Date  . Hypertension   . Skin cancer   . Coronary artery disease   . Depression   . GERD (gastroesophageal reflux disease)   . Cardiovascular disease   . High cholesterol   . Pneumonia   . Fracture   . Obesity   . Sleep apnea   . CHF (congestive heart failure) ([place])   . Dementia   . Atrial fibrillation ([place])   .  Urinary retention   . Urine frequency   . Incomplete bladder emptying   . Diabetes mellitus, type 2 ([place])   . Stroke ([place])   . Peripheral vascular disease ([place])   . Aortic aneurysm ([place])   . Aortic valve replaced   . Hyperthyroidism     Surgical History: Past Surgical History  Procedure Laterality Date  . Hernia repair    . Cardiac surgery    . Neck surgery    . Degenerative disk disease    . Circumcision    . Coronary artery bypass graft    . Suprapubic catheter surgery  09/22/2014    placement    Home Medications:    Medication List       This list is accurate as of: 02/05/15  3:14 PM.  Always use your most recent med list.               ACIDOPHILUS PROBIOTIC 100 MG Caps  Take on tablet twice daily     amLODipine 10 MG tablet  Commonly known as:  NORVASC  Take 10 mg by mouth daily.     amoxicillin-clavulanate 875-125 MG tablet  Commonly known as:  AUGMENTIN  Reported on 02/05/2015     atorvastatin 80 MG tablet  Commonly known as:  LIPITOR  Take 80 mg by mouth daily at 6 PM.     BARD PLASTIC TUBING/ADAPTER  Misc  Use with foley cath     Calcium-Vitamin D 600-400 MG-UNIT Tabs  Take 1 tablet by mouth 2 (two) times daily.     carbamide peroxide 6.5 % otic solution  Commonly known as:  DEBROX  Place 5 drops into both ears 2 (two) times a week.     cephALEXin 500 MG capsule  Commonly known as:  KEFLEX  Take 500 mg by mouth 4 (four) times daily. Reported on 02/05/2015     clotrimazole 1 % cream  Commonly known as:  LOTRIMIN  Apply 1 application topically 2 (two) times daily. APPLY TO PENIS     Cranberry 250 MG Tabs  Take by mouth twice daily     FIBER 7 Powd  Take 1 scoop by mouth 3 (three) times daily.     fosfomycin 3 g Pack  Commonly known as:  MONUROL  Take 3 g by mouth once.     FUNGOID TINCTURE 2 % Soln  Generic drug:  Miconazole Nitrate  Apply 1 application topically 2 (two) times daily as needed (FOR AFFECTED AREAS).     gabapentin 300  MG capsule  Commonly known as:  NEURONTIN  Take 300 mg by mouth 3 (three) times daily.     HUMALOG KWIKPEN 100 UNIT/ML KiwkPen  Generic drug:  insulin lispro     losartan 100 MG tablet  Commonly known as:  COZAAR  Take 100 mg by mouth daily.     metoprolol 50 MG tablet  Commonly known as:  LOPRESSOR  Take 50 mg by mouth 2 (two) times daily.     polyethylene glycol packet  Commonly known as:  MIRALAX / GLYCOLAX  Take 17 g by mouth 2 (two) times daily. Hold if loose stools     PRESCRIPTION MEDICATION  Inhale 1 application into the lungs at bedtime. BIPAP     PRESERVISION AREDS 2 Caps  Take 1 capsule by mouth 2 (two) times daily.     psyllium 28 % packet  Commonly known as:  METAMUCIL SMOOTH TEXTURE  Take 1 packet by mouth 3 (three) times daily. HOLD FOR DIARRHEA     QUEtiapine 50 MG tablet  Commonly known as:  SEROQUEL  Take 50 mg by mouth at bedtime.     ranitidine 150 MG capsule  Commonly known as:  ZANTAC  Take 150 mg by mouth every morning.     TOUJEO SOLOSTAR 300 UNIT/ML Sopn  Generic drug:  Insulin Glargine  Take 43 Units by mouth every morning.     traZODone 100 MG tablet  Commonly known as:  DESYREL  Take 150 mg by mouth at bedtime.     vitamin B-12 1000 MCG tablet  Commonly known as:  CYANOCOBALAMIN  Take 1,000 mcg by mouth daily.     warfarin 7.5 MG tablet  Commonly known as:  COUMADIN  Take 7.5 mg by mouth daily.        Allergies: No Known Allergies  Family History: Family History  Problem Relation Age of Onset  . Cancer Neg Hx     Kidney,Bladder ,Prostate    Social History:  reports that he has quit smoking. He quit smokeless tobacco use about 2 years ago. He reports that he does not drink alcohol or use illicit drugs.  ROS: Urological Symptom Review  Patient is experiencing the following symptoms: Frequent urination Hard to postpone urination Get up at night to urinate Leakage of urine   Review of Systems  Gastrointestinal  (upper)  :  Negative for upper GI symptoms  Gastrointestinal (lower) : Constipation  Constitutional : Negative for symptoms  Skin: Negative for skin symptoms  Eyes: Negative for eye symptoms  Ear/Nose/Throat : Negative for Ear/Nose/Throat symptoms  Hematologic/Lymphatic: Negative for Hematologic/Lymphatic symptoms  Cardiovascular : Negative for cardiovascular symptoms  Respiratory : Shortness of breath  Endocrine: Excessive thirst  Musculoskeletal: Back pain  Neurological: Negative for neurological symptoms  Psychologic: Negative for psychiatric symptoms   Physical Exam: BP 175/79 mmHg  Pulse 76  Ht 5\' 9"  (1.753 m)  Gastrointestinal: Soft with faint LLQ tenderness, no rebound or guarding. No distention. No abdominal bruits. No CVA tenderness.  SPT site is clean and dry.  A scant amount of granulation tissue is present. Genitourinary: GU: Patient with uncircumcised phallus.  Foreskin easily retracted  Urethral meatus is patent.  No penile discharge. No penile lesions or rashes. Scrotum without lesions, cysts, rashes and/or edema.  Testicles are located scrotally bilaterally. No masses are appreciated in the testicles. Left and right epididymis are normal.  Laboratory Data:  Lab Results  Component Value Date   WBC 9.3 12/12/2014   HGB 13.1 12/12/2014   HCT 40.4 12/12/2014   MCV 86.7 12/12/2014   PLT 223 12/12/2014    Lab Results  Component Value Date   CREATININE 1.23 12/12/2014    Assessment & Plan:    1. Urinary retention:   Patient's SPT is exchanged without difficulty.   The new SPT is draining well.    2. Urinary incontinence:   We will manage with SPT placement.  3. Recurrent UTI's:  Patient tolerated his bladder instillation well today.  He will return either Monday or Tuesday for his next instillation.  I do not know the status of the fosfomycin prior authorization today.  We will perform these bladder washings twice weekly for 3 weeks and  then reassess.  He will RTC on Monday or Tuesday for his next instillation.    Zara Council, [facility] [address] [place], [place] [address] [phone]

## 2015-02-10 ENCOUNTER — Encounter: Payer: Self-pay | Admitting: Urology

## 2015-02-10 ENCOUNTER — Ambulatory Visit (INDEPENDENT_AMBULATORY_CARE_PROVIDER_SITE_OTHER): Payer: Medicare Other | Admitting: Urology

## 2015-02-10 VITALS — BP 143/80 | HR 83 | Ht 68.0 in | Wt 190.0 lb

## 2015-02-10 DIAGNOSIS — R32 Unspecified urinary incontinence: Secondary | ICD-10-CM | POA: Diagnosis not present

## 2015-02-10 DIAGNOSIS — R339 Retention of urine, unspecified: Secondary | ICD-10-CM | POA: Diagnosis not present

## 2015-02-10 DIAGNOSIS — N390 Urinary tract infection, site not specified: Secondary | ICD-10-CM | POA: Diagnosis not present

## 2015-02-10 MED ORDER — SODIUM CHLORIDE 0.9 % IJ SOLN
50.0000 mL | Freq: Once | INTRAMUSCULAR | Status: AC
  Administered 2015-02-10: 50 mL

## 2015-02-10 MED ORDER — GENTAMICIN SULFATE 40 MG/ML IJ SOLN
80.0000 mg | Freq: Once | INTRAMUSCULAR | Status: AC
  Administered 2015-02-10: 80 mg

## 2015-02-10 NOTE — Progress Notes (Signed)
1:42 PM   [facility] 1940/07/11 [id number]  Referring provider: [name], PA [address] [place], [place] [address]  Chief Complaint  Patient presents with  . Recurrent UTI    Gent instillation    HPI: Patient is a 75 year old Caucasian male who presents today for his third bladder instillation of gentamycin.      Background story Patient has dementia and is unable to recognize when he needs to urinate. He was having overflow incontinence and breakdown in the skin of his perineum.   He could not tolerate an indwelling Foley due to his dementia.  He had traumatically removed the Foley at one time.  He now has a suprapubic tube in place.   Patient has had several positive urine cultures   I have discussed with his primary caregiver the phenomenon of colonization and not to address any positive urine cultures unless there are signs of infection.   Patient's caregiver states that when he does have a urinary tract infection he has increased lower extremity weakness and his dementia is worsened.   His primary care provider's office is currently trying to obtain a prior authorization for the antibiotic fosfomycin, but they have been unsuccessful.   He has been evaluated by infectious disease as well.  Today, he is feeling well.   He tolerated the second bladder instillation.   His most recent culture from 12/30/2014 was positive for Proteus mirabilis and Citrobacter freundii.  His caregivers husband has brought him to his appointment today and he is not sure if he has received the fosfomycin.    PMH: Past Medical History  Diagnosis Date  . Hypertension   . Skin cancer   . Coronary artery disease   . Depression   . GERD (gastroesophageal reflux disease)   . Cardiovascular disease   . High cholesterol   . Pneumonia   . Fracture   . Obesity   . Sleep apnea   . CHF (congestive heart failure) ([place])   . Dementia   . Atrial fibrillation ([place])   . Urinary retention   .  Urine frequency   . Incomplete bladder emptying   . Diabetes mellitus, type 2 ([place])   . Stroke ([place])   . Peripheral vascular disease ([place])   . Aortic aneurysm ([place])   . Aortic valve replaced   . Hyperthyroidism     Surgical History: Past Surgical History  Procedure Laterality Date  . Hernia repair    . Cardiac surgery    . Neck surgery    . Degenerative disk disease    . Circumcision    . Coronary artery bypass graft    . Suprapubic catheter surgery  09/22/2014    placement    Home Medications:    Medication List       This list is accurate as of: 02/10/15  1:42 PM.  Always use your most recent med list.               ACIDOPHILUS PROBIOTIC 100 MG Caps  Take on tablet twice daily     amLODipine 10 MG tablet  Commonly known as:  NORVASC  Take 10 mg by mouth daily.     amoxicillin-clavulanate 875-125 MG tablet  Commonly known as:  AUGMENTIN  Reported on 02/05/2015     atorvastatin 80 MG tablet  Commonly known as:  LIPITOR  Take 80 mg by mouth daily at 6 PM.     BARD PLASTIC TUBING/ADAPTER Misc  Use  with foley cath     Calcium-Vitamin D 600-400 MG-UNIT Tabs  Take 1 tablet by mouth 2 (two) times daily.     carbamide peroxide 6.5 % otic solution  Commonly known as:  DEBROX  Place 5 drops into both ears 2 (two) times a week.     cephALEXin 500 MG capsule  Commonly known as:  KEFLEX  Take 500 mg by mouth 4 (four) times daily. Reported on 02/05/2015     clotrimazole 1 % cream  Commonly known as:  LOTRIMIN  Apply 1 application topically 2 (two) times daily. APPLY TO PENIS     Cranberry 250 MG Tabs  Take by mouth twice daily     FIBER 7 Powd  Take 1 scoop by mouth 3 (three) times daily.     fosfomycin 3 g Pack  Commonly known as:  MONUROL  Take 3 g by mouth once.     FUNGOID TINCTURE 2 % Soln  Generic drug:  Miconazole Nitrate  Apply 1 application topically 2 (two) times daily as needed (FOR AFFECTED AREAS).     gabapentin 300 MG capsule  Commonly  known as:  NEURONTIN  Take 300 mg by mouth 3 (three) times daily.     HUMALOG KWIKPEN 100 UNIT/ML KiwkPen  Generic drug:  insulin lispro     losartan 100 MG tablet  Commonly known as:  COZAAR  Take 100 mg by mouth daily.     metoprolol 50 MG tablet  Commonly known as:  LOPRESSOR  Take 50 mg by mouth 2 (two) times daily.     polyethylene glycol packet  Commonly known as:  MIRALAX / GLYCOLAX  Take 17 g by mouth 2 (two) times daily. Hold if loose stools     PRESCRIPTION MEDICATION  Inhale 1 application into the lungs at bedtime. BIPAP     PRESERVISION AREDS 2 Caps  Take 1 capsule by mouth 2 (two) times daily.     psyllium 28 % packet  Commonly known as:  METAMUCIL SMOOTH TEXTURE  Take 1 packet by mouth 3 (three) times daily. HOLD FOR DIARRHEA     QUEtiapine 50 MG tablet  Commonly known as:  SEROQUEL  Take 50 mg by mouth at bedtime.     ranitidine 150 MG capsule  Commonly known as:  ZANTAC  Take 150 mg by mouth every morning.     TOUJEO SOLOSTAR 300 UNIT/ML Sopn  Generic drug:  Insulin Glargine  Take 43 Units by mouth every morning.     traZODone 100 MG tablet  Commonly known as:  DESYREL  Take 150 mg by mouth at bedtime.     vitamin B-12 1000 MCG tablet  Commonly known as:  CYANOCOBALAMIN  Take 1,000 mcg by mouth daily.     warfarin 7.5 MG tablet  Commonly known as:  COUMADIN  Take 7.5 mg by mouth daily.        Allergies: No Known Allergies  Family History: Family History  Problem Relation Age of Onset  . Cancer Neg Hx     Kidney,Bladder ,Prostate    Social History:  reports that he has quit smoking. He quit smokeless tobacco use about 2 years ago. He reports that he does not drink alcohol or use illicit drugs.  ROS: Urological Symptom Review  Patient is experiencing the following symptoms: Frequent urination Hard to postpone urination Get up at night to urinate Leakage of urine   Review of Systems  Gastrointestinal (upper)  : Negative for  upper  GI symptoms  Gastrointestinal (lower) : Constipation  Constitutional : Negative for symptoms  Skin: Negative for skin symptoms  Eyes: Negative for eye symptoms  Ear/Nose/Throat : Negative for Ear/Nose/Throat symptoms  Hematologic/Lymphatic: Negative for Hematologic/Lymphatic symptoms  Cardiovascular : Negative for cardiovascular symptoms  Respiratory : Shortness of breath  Endocrine: Excessive thirst  Musculoskeletal: Back pain  Neurological: Negative for neurological symptoms  Psychologic: Negative for psychiatric symptoms   Physical Exam: BP 143/80 mmHg  Pulse 83  Ht 5\' 8"  (1.727 m)  Wt 190 lb (86.183 kg)  BMI 28.90 kg/m2  Gastrointestinal: Soft with faint LLQ tenderness, no rebound or guarding. No distention. No abdominal bruits. No CVA tenderness.  SPT site is clean and dry.  A scant amount of granulation tissue is present. Genitourinary: GU: Patient with uncircumcised phallus.  Foreskin easily retracted  Urethral meatus is patent.  No penile discharge. No penile lesions or rashes. Scrotum without lesions, cysts, rashes and/or edema.  Testicles are located scrotally bilaterally. No masses are appreciated in the testicles. Left and right epididymis are normal.  Laboratory Data:  Lab Results  Component Value Date   WBC 9.3 12/12/2014   HGB 13.1 12/12/2014   HCT 40.4 12/12/2014   MCV 86.7 12/12/2014   PLT 223 12/12/2014    Lab Results  Component Value Date   CREATININE 1.23 12/12/2014    Assessment & Plan:    1. Urinary retention:   Managed with a SPT changed monthly.  It was last changed on 02/05/2015.  2. Urinary incontinence:   We will manage with SPT placement.  3. Recurrent UTI's:  Patient tolerated his bladder instillation well today.  He will on Thursday for his next instillation.  I do not know the status of the fosfomycin prior authorization today.  We will perform these bladder washings twice weekly for 3 weeks and then reassess.     Zara Council, [facility] [address] [place], [place] [address] [phone]

## 2015-02-10 NOTE — Progress Notes (Signed)
Bladder Irrigation  Due to recurrent urinary tract infections patient is present today for a Gentamicin bladder irrigation. Patient has a suprapubic tube . 10 ml of saline/steriit was removed from the bag and solution was instilled in the bladder and tube was plugged for 45 minutes then released.  After irrigation urine flow was noted no complications were noted catheter is now draining fine.  Catheter was reattached to the bag for drainage. Patient tolerated well.   Preformed by: Lyndee Hensen CMA Additional notes/ Follow up: Thursday

## 2015-02-12 ENCOUNTER — Ambulatory Visit (INDEPENDENT_AMBULATORY_CARE_PROVIDER_SITE_OTHER): Payer: Medicare Other | Admitting: Urology

## 2015-02-12 ENCOUNTER — Encounter: Payer: Self-pay | Admitting: Urology

## 2015-02-12 VITALS — BP 135/77 | HR 89 | Ht 69.0 in | Wt 190.0 lb

## 2015-02-12 DIAGNOSIS — R32 Unspecified urinary incontinence: Secondary | ICD-10-CM | POA: Diagnosis not present

## 2015-02-12 DIAGNOSIS — N390 Urinary tract infection, site not specified: Secondary | ICD-10-CM

## 2015-02-12 DIAGNOSIS — R339 Retention of urine, unspecified: Secondary | ICD-10-CM

## 2015-02-12 MED ORDER — GENTAMICIN SULFATE 40 MG/ML IJ SOLN
80.0000 mg | Freq: Once | INTRAMUSCULAR | Status: AC
  Administered 2015-02-12: 80 mg

## 2015-02-12 MED ORDER — SODIUM CHLORIDE 0.9 % IJ SOLN
50.0000 mL | Freq: Once | INTRAMUSCULAR | Status: AC
  Administered 2015-02-12: 50 mL

## 2015-02-12 NOTE — Progress Notes (Signed)
Bladder Instillation  Patient present for Gentamicin instillation for recurrent uti. Patient has a supra pubic tube, so Gentamicin 80 mg/42ml and 50 ml sodium chloride was instilled into tube and tube plugged. Patient will hold solution for 45 minutes and then patient caregiver to unplug and connect back to bag. No complications noted.   Given by: Lyndee Hensen

## 2015-02-13 ENCOUNTER — Telehealth: Payer: Self-pay | Admitting: Urology

## 2015-02-13 NOTE — Telephone Encounter (Signed)
Spoke with Manuela Schwartz and let her know that Larene Beach was out of the office until Monday but that I had asked another provider Erlene Quan) and she said she didn't see why they couldn't be given by home health. I let her know that I would talk to [facility] on Monday about it and if Larene Beach agrees we can fax her the orders. Caregiver Manuela Schwartz states that fine.

## 2015-02-13 NOTE — Telephone Encounter (Signed)
Pt's caregiver, Manuela Schwartz, called asking if the Gentamicin installations can be given through [facility].  If they can, can there be a prescription written for this?  They have appts in our office next week on 1/24 and 1/26.  Please call the caregiver.

## 2015-02-16 ENCOUNTER — Telehealth: Payer: Self-pay | Admitting: *Deleted

## 2015-02-16 NOTE — Progress Notes (Signed)
12:10 PM   Bobby Jacobs [address] Oct 24, 1940 [id number]  Referring provider: [name], PA [place], [place] [address]  Chief Complaint  Patient presents with  . Recurrent UTI    Gent instillation    HPI: Patient is a 75 year old Caucasian male who presents today for his fourth bladder instillation of gentamycin.      Background story Patient has dementia and is unable to recognize when he needs to urinate. He was having overflow incontinence and breakdown in the skin of his perineum.   He could not tolerate an indwelling Foley due to his dementia.  He had traumatically removed the Foley at one time.  He now has a suprapubic tube in place.   Patient has had several positive urine cultures   I have discussed with his primary caregiver the phenomenon of colonization and not to address any positive urine cultures unless there are signs of infection.   Patient's caregiver states that when he does have a urinary tract infection he has increased lower extremity weakness and his dementia is worsened.   His primary care provider's office is currently trying to obtain a prior authorization for the antibiotic fosfomycin, but they have been unsuccessful.   He has been evaluated by infectious disease as well.  Today, he is feeling well.   He tolerated the second bladder instillation.   His most recent culture from 12/30/2014 was positive for Proteus mirabilis and Citrobacter freundii.  His PCP's office has still not acquired the prior authorization for the fosfomycin.     PMH: Past Medical History  Diagnosis Date  . Hypertension   . Skin cancer   . Coronary artery disease   . Depression   . GERD (gastroesophageal reflux disease)   . Cardiovascular disease   . High cholesterol   . Pneumonia   . Fracture   . Obesity   . Sleep apnea   . CHF (congestive heart failure) ([place])   . Dementia   . Atrial fibrillation ([place])   . Urinary retention   . Urine frequency   . Incomplete  bladder emptying   . Diabetes mellitus, type 2 ([place])   . Stroke ([place])   . Peripheral vascular disease ([place])   . Aortic aneurysm ([place])   . Aortic valve replaced   . Hyperthyroidism     Surgical History: Past Surgical History  Procedure Laterality Date  . Hernia repair    . Cardiac surgery    . Neck surgery    . Degenerative disk disease    . Circumcision    . Coronary artery bypass graft    . Suprapubic catheter surgery  09/22/2014    placement    Home Medications:    Medication List       This list is accurate as of: 02/12/15 11:59 PM.  Always use your most recent med list.               ACIDOPHILUS PROBIOTIC 100 MG Caps  Take on tablet twice daily     amLODipine 10 MG tablet  Commonly known as:  NORVASC  Take 10 mg by mouth daily.     amoxicillin-clavulanate 875-125 MG tablet  Commonly known as:  AUGMENTIN  Reported on 02/05/2015     atorvastatin 80 MG tablet  Commonly known as:  LIPITOR  Take 80 mg by mouth daily at 6 PM.     BARD PLASTIC TUBING/ADAPTER Misc  Use with foley cath     Calcium-Vitamin  D 600-400 MG-UNIT Tabs  Take 1 tablet by mouth 2 (two) times daily.     carbamide peroxide 6.5 % otic solution  Commonly known as:  DEBROX  Place 5 drops into both ears 2 (two) times a week.     cephALEXin 500 MG capsule  Commonly known as:  KEFLEX  Take 500 mg by mouth 4 (four) times daily. Reported on 02/05/2015     clotrimazole 1 % cream  Commonly known as:  LOTRIMIN  Apply 1 application topically 2 (two) times daily. APPLY TO PENIS     Cranberry 250 MG Tabs  Take by mouth twice daily     FIBER 7 Powd  Take 1 scoop by mouth 3 (three) times daily.     fosfomycin 3 g Pack  Commonly known as:  MONUROL  Take 3 g by mouth once.     FUNGOID TINCTURE 2 % Soln  Generic drug:  Miconazole Nitrate  Apply 1 application topically 2 (two) times daily as needed (FOR AFFECTED AREAS).     gabapentin 300 MG capsule  Commonly known as:  NEURONTIN  Take 300  mg by mouth 3 (three) times daily.     HUMALOG KWIKPEN 100 UNIT/ML KiwkPen  Generic drug:  insulin lispro     losartan 100 MG tablet  Commonly known as:  COZAAR  Take 100 mg by mouth daily.     metoprolol 50 MG tablet  Commonly known as:  LOPRESSOR  Take 50 mg by mouth 2 (two) times daily.     polyethylene glycol packet  Commonly known as:  MIRALAX / GLYCOLAX  Take 17 g by mouth 2 (two) times daily. Hold if loose stools     PRESCRIPTION MEDICATION  Inhale 1 application into the lungs at bedtime. BIPAP     PRESERVISION AREDS 2 Caps  Take 1 capsule by mouth 2 (two) times daily.     psyllium 28 % packet  Commonly known as:  METAMUCIL SMOOTH TEXTURE  Take 1 packet by mouth 3 (three) times daily. HOLD FOR DIARRHEA     QUEtiapine 50 MG tablet  Commonly known as:  SEROQUEL  Take 50 mg by mouth at bedtime.     ranitidine 150 MG capsule  Commonly known as:  ZANTAC  Take 150 mg by mouth every morning.     TOUJEO SOLOSTAR 300 UNIT/ML Sopn  Generic drug:  Insulin Glargine  Take 43 Units by mouth every morning.     traZODone 100 MG tablet  Commonly known as:  DESYREL  Take 150 mg by mouth at bedtime.     vitamin B-12 1000 MCG tablet  Commonly known as:  CYANOCOBALAMIN  Take 1,000 mcg by mouth daily.     warfarin 7.5 MG tablet  Commonly known as:  COUMADIN  Take 7.5 mg by mouth daily.        Allergies: No Known Allergies  Family History: Family History  Problem Relation Age of Onset  . Cancer Neg Hx     Kidney,Bladder ,Prostate    Social History:  reports that he has quit smoking. He quit smokeless tobacco use about 2 years ago. He reports that he does not drink alcohol or use illicit drugs.  ROS: Urological Symptom Review  Patient is experiencing the following symptoms: Frequent urination Hard to postpone urination Get up at night to urinate Leakage of urine   Review of Systems  Gastrointestinal (upper)  : Negative for upper GI  symptoms  Gastrointestinal (lower) : Constipation  Constitutional : Negative for symptoms  Skin: Negative for skin symptoms  Eyes: Negative for eye symptoms  Ear/Nose/Throat : Negative for Ear/Nose/Throat symptoms  Hematologic/Lymphatic: Negative for Hematologic/Lymphatic symptoms  Cardiovascular : Negative for cardiovascular symptoms  Respiratory : Shortness of breath  Endocrine: Excessive thirst  Musculoskeletal: Back pain  Neurological: Negative for neurological symptoms  Psychologic: Negative for psychiatric symptoms   Physical Exam: BP 135/77 mmHg  Pulse 89  Ht 5\' 9"  (1.753 m)  Wt 190 lb (86.183 kg)  BMI 28.05 kg/m2  Gastrointestinal: Soft with faint LLQ tenderness, no rebound or guarding. No distention. No abdominal bruits. No CVA tenderness.  SPT site is clean and dry.  A scant amount of granulation tissue is present. Genitourinary: GU: Patient with uncircumcised phallus.  Foreskin easily retracted  Urethral meatus is patent.  No penile discharge. No penile lesions or rashes. Scrotum without lesions, cysts, rashes and/or edema.  Testicles are located scrotally bilaterally. No masses are appreciated in the testicles. Left and right epididymis are normal.  Laboratory Data:  Lab Results  Component Value Date   WBC 9.3 12/12/2014   HGB 13.1 12/12/2014   HCT 40.4 12/12/2014   MCV 86.7 12/12/2014   PLT 223 12/12/2014    Lab Results  Component Value Date   CREATININE 1.23 12/12/2014    Assessment & Plan:    1. Urinary retention:   Managed with a SPT changed monthly.  It was last changed on 02/05/2015.  2. Urinary incontinence:   We will manage with SPT placement.  3. Recurrent UTI's:  Patient tolerated his bladder instillation well today.  He will RTC on Tuesday for his next instillation.  Patient has not received the fosfomycin prior authorization.  We will perform these bladder washings twice weekly for 3 weeks and then reassess.    Zara Council, [facility] [address] [place], [place] [address] [phone]

## 2015-02-16 NOTE — Telephone Encounter (Signed)
Spoke to Manuela Schwartz to let her know that I was faxing the order to her to give to home health for patient's gentamicin instillations. I would cancel his appointments for this week. Manuela Schwartz patient's caregiver states that's fine. Faxed and received confirmation.

## 2015-02-17 ENCOUNTER — Ambulatory Visit: Payer: Medicare Other | Admitting: Urology

## 2015-02-19 ENCOUNTER — Telehealth: Payer: Self-pay | Admitting: Urology

## 2015-02-19 ENCOUNTER — Ambulatory Visit: Payer: Medicare Other | Admitting: Urology

## 2015-02-19 NOTE — Telephone Encounter (Signed)
Pt's caregiver, Manuela Schwartz called and stated catheter is clogged up since yesterday at lunch time.  He has been peeing out of his penis.  [facility] nurse is coming out, but she's not sure when to do the gentamicin installation.  They will be out this afternoon and wondered if he should come in.  [phone]

## 2015-02-19 NOTE — Telephone Encounter (Signed)
I suggest replacing the SPT and see if that makes a difference.  We had to do that recently with his SPT.  I spoke with [facility] nursing yesterday.

## 2015-02-20 ENCOUNTER — Other Ambulatory Visit: Payer: Self-pay | Admitting: Physician Assistant

## 2015-02-20 ENCOUNTER — Ambulatory Visit
Admission: RE | Admit: 2015-02-20 | Discharge: 2015-02-20 | Disposition: A | Discharge status: Home / Self Care | Payer: Medicare Other | Source: Ambulatory Visit | Attending: Physician Assistant | Admitting: Physician Assistant

## 2015-02-20 ENCOUNTER — Inpatient Hospital Stay
Admission: EM | Admit: 2015-02-20 | Discharge: 2015-03-02 | DRG: 178 | Disposition: A | Discharge status: Skilled Nursing Facility | Payer: Medicare Other | Attending: Internal Medicine | Admitting: Internal Medicine

## 2015-02-20 ENCOUNTER — Emergency Department: Payer: Medicare Other

## 2015-02-20 ENCOUNTER — Other Ambulatory Visit: Payer: Self-pay

## 2015-02-20 DIAGNOSIS — E669 Obesity, unspecified: Secondary | ICD-10-CM | POA: Diagnosis present

## 2015-02-20 DIAGNOSIS — Z7901 Long term (current) use of anticoagulants: Secondary | ICD-10-CM | POA: Diagnosis not present

## 2015-02-20 DIAGNOSIS — Z85828 Personal history of other malignant neoplasm of skin: Secondary | ICD-10-CM | POA: Diagnosis not present

## 2015-02-20 DIAGNOSIS — E1165 Type 2 diabetes mellitus with hyperglycemia: Secondary | ICD-10-CM | POA: Diagnosis present

## 2015-02-20 DIAGNOSIS — R062 Wheezing: Secondary | ICD-10-CM | POA: Insufficient documentation

## 2015-02-20 DIAGNOSIS — I959 Hypotension, unspecified: Secondary | ICD-10-CM | POA: Diagnosis present

## 2015-02-20 DIAGNOSIS — N12 Tubulo-interstitial nephritis, not specified as acute or chronic: Secondary | ICD-10-CM | POA: Diagnosis present

## 2015-02-20 DIAGNOSIS — Z794 Long term (current) use of insulin: Secondary | ICD-10-CM | POA: Diagnosis not present

## 2015-02-20 DIAGNOSIS — R0902 Hypoxemia: Secondary | ICD-10-CM

## 2015-02-20 DIAGNOSIS — G4730 Sleep apnea, unspecified: Secondary | ICD-10-CM | POA: Diagnosis present

## 2015-02-20 DIAGNOSIS — K219 Gastro-esophageal reflux disease without esophagitis: Secondary | ICD-10-CM | POA: Diagnosis present

## 2015-02-20 DIAGNOSIS — W19XXXA Unspecified fall, initial encounter: Secondary | ICD-10-CM | POA: Diagnosis present

## 2015-02-20 DIAGNOSIS — S2231XA Fracture of one rib, right side, initial encounter for closed fracture: Secondary | ICD-10-CM

## 2015-02-20 DIAGNOSIS — Z66 Do not resuscitate: Secondary | ICD-10-CM | POA: Diagnosis present

## 2015-02-20 DIAGNOSIS — R0602 Shortness of breath: Secondary | ICD-10-CM

## 2015-02-20 DIAGNOSIS — S2239XA Fracture of one rib, unspecified side, initial encounter for closed fracture: Secondary | ICD-10-CM

## 2015-02-20 DIAGNOSIS — J189 Pneumonia, unspecified organism: Secondary | ICD-10-CM | POA: Diagnosis present

## 2015-02-20 DIAGNOSIS — F0390 Unspecified dementia without behavioral disturbance: Secondary | ICD-10-CM | POA: Diagnosis present

## 2015-02-20 DIAGNOSIS — B964 Proteus (mirabilis) (morganii) as the cause of diseases classified elsewhere: Secondary | ICD-10-CM | POA: Diagnosis present

## 2015-02-20 DIAGNOSIS — I4891 Unspecified atrial fibrillation: Secondary | ICD-10-CM | POA: Diagnosis present

## 2015-02-20 DIAGNOSIS — Z8051 Family history of malignant neoplasm of kidney: Secondary | ICD-10-CM

## 2015-02-20 DIAGNOSIS — R109 Unspecified abdominal pain: Secondary | ICD-10-CM

## 2015-02-20 DIAGNOSIS — R410 Disorientation, unspecified: Secondary | ICD-10-CM

## 2015-02-20 DIAGNOSIS — Z87440 Personal history of urinary (tract) infections: Secondary | ICD-10-CM | POA: Diagnosis not present

## 2015-02-20 DIAGNOSIS — I509 Heart failure, unspecified: Secondary | ICD-10-CM | POA: Diagnosis present

## 2015-02-20 DIAGNOSIS — J181 Lobar pneumonia, unspecified organism: Secondary | ICD-10-CM

## 2015-02-20 DIAGNOSIS — I2510 Atherosclerotic heart disease of native coronary artery without angina pectoris: Secondary | ICD-10-CM | POA: Diagnosis present

## 2015-02-20 DIAGNOSIS — I110 Hypertensive heart disease with heart failure: Secondary | ICD-10-CM | POA: Diagnosis present

## 2015-02-20 DIAGNOSIS — Z8052 Family history of malignant neoplasm of bladder: Secondary | ICD-10-CM

## 2015-02-20 DIAGNOSIS — Z8042 Family history of malignant neoplasm of prostate: Secondary | ICD-10-CM | POA: Diagnosis not present

## 2015-02-20 DIAGNOSIS — K5900 Constipation, unspecified: Secondary | ICD-10-CM | POA: Diagnosis present

## 2015-02-20 DIAGNOSIS — I700 Atherosclerosis of aorta: Secondary | ICD-10-CM | POA: Diagnosis not present

## 2015-02-20 DIAGNOSIS — Y95 Nosocomial condition: Secondary | ICD-10-CM | POA: Diagnosis present

## 2015-02-20 DIAGNOSIS — Z87891 Personal history of nicotine dependence: Secondary | ICD-10-CM | POA: Diagnosis not present

## 2015-02-20 DIAGNOSIS — Z951 Presence of aortocoronary bypass graft: Secondary | ICD-10-CM

## 2015-02-20 DIAGNOSIS — Z952 Presence of prosthetic heart valve: Secondary | ICD-10-CM | POA: Diagnosis not present

## 2015-02-20 DIAGNOSIS — S2241XA Multiple fractures of ribs, right side, initial encounter for closed fracture: Secondary | ICD-10-CM | POA: Diagnosis present

## 2015-02-20 DIAGNOSIS — Z79899 Other long term (current) drug therapy: Secondary | ICD-10-CM | POA: Diagnosis not present

## 2015-02-20 DIAGNOSIS — J690 Pneumonitis due to inhalation of food and vomit: Principal | ICD-10-CM | POA: Diagnosis present

## 2015-02-20 DIAGNOSIS — Z9889 Other specified postprocedural states: Secondary | ICD-10-CM

## 2015-02-20 DIAGNOSIS — Z8673 Personal history of transient ischemic attack (TIA), and cerebral infarction without residual deficits: Secondary | ICD-10-CM | POA: Diagnosis not present

## 2015-02-20 DIAGNOSIS — R52 Pain, unspecified: Secondary | ICD-10-CM

## 2015-02-20 DIAGNOSIS — T1490XA Injury, unspecified, initial encounter: Secondary | ICD-10-CM

## 2015-02-20 LAB — URINALYSIS COMPLETE WITH MICROSCOPIC (ARMC ONLY)
BACTERIA UA: NONE SEEN
BILIRUBIN URINE: NEGATIVE
GLUCOSE, UA: NEGATIVE mg/dL
KETONES UR: NEGATIVE mg/dL
NITRITE: NEGATIVE
PROTEIN: 100 mg/dL — AB
SPECIFIC GRAVITY, URINE: 1.01 (ref 1.005–1.030)
Squamous Epithelial / LPF: NONE SEEN
pH: 8 (ref 5.0–8.0)

## 2015-02-20 LAB — CBC WITH DIFFERENTIAL/PLATELET
Basophils Absolute: 0 10*3/uL (ref 0–0.1)
Basophils Relative: 0 %
EOS ABS: 0 10*3/uL (ref 0–0.7)
EOS PCT: 0 %
HCT: 37.2 % — ABNORMAL LOW (ref 40.0–52.0)
HEMOGLOBIN: 12.4 g/dL — AB (ref 13.0–18.0)
LYMPHS ABS: 0.6 10*3/uL — AB (ref 1.0–3.6)
Lymphocytes Relative: 4 %
MCH: 27.9 pg (ref 26.0–34.0)
MCHC: 33.3 g/dL (ref 32.0–36.0)
MCV: 83.8 fL (ref 80.0–100.0)
MONOS PCT: 9 %
Monocytes Absolute: 1.3 10*3/uL — ABNORMAL HIGH (ref 0.2–1.0)
Neutro Abs: 12.2 10*3/uL — ABNORMAL HIGH (ref 1.4–6.5)
Neutrophils Relative %: 87 %
PLATELETS: 185 10*3/uL (ref 150–440)
RBC: 4.44 MIL/uL (ref 4.40–5.90)
RDW: 15.3 % — ABNORMAL HIGH (ref 11.5–14.5)
WBC: 14.1 10*3/uL — ABNORMAL HIGH (ref 3.8–10.6)

## 2015-02-20 LAB — PROTIME-INR
INR: 3
PROTHROMBIN TIME: 30.6 s — AB (ref 11.4–15.0)

## 2015-02-20 LAB — GLUCOSE, CAPILLARY
GLUCOSE-CAPILLARY: 162 mg/dL — AB (ref 65–99)
Glucose-Capillary: 185 mg/dL — ABNORMAL HIGH (ref 65–99)

## 2015-02-20 LAB — COMPREHENSIVE METABOLIC PANEL
ALK PHOS: 76 U/L (ref 38–126)
ALT: 19 U/L (ref 17–63)
ANION GAP: 8 (ref 5–15)
AST: 23 U/L (ref 15–41)
Albumin: 3.2 g/dL — ABNORMAL LOW (ref 3.5–5.0)
BUN: 39 mg/dL — ABNORMAL HIGH (ref 6–20)
CALCIUM: 8.3 mg/dL — AB (ref 8.9–10.3)
CO2: 24 mmol/L (ref 22–32)
Chloride: 96 mmol/L — ABNORMAL LOW (ref 101–111)
Creatinine, Ser: 1.7 mg/dL — ABNORMAL HIGH (ref 0.61–1.24)
GFR, EST AFRICAN AMERICAN: 43 mL/min — AB (ref >60)
GFR, EST NON AFRICAN AMERICAN: 37 mL/min — AB (ref >60)
Glucose, Bld: 191 mg/dL — ABNORMAL HIGH (ref 65–99)
Potassium: 4.3 mmol/L (ref 3.5–5.1)
SODIUM: 128 mmol/L — AB (ref 135–145)
Total Bilirubin: 1.1 mg/dL (ref 0.3–1.2)
Total Protein: 6.2 g/dL — ABNORMAL LOW (ref 6.5–8.1)

## 2015-02-20 LAB — LACTIC ACID, PLASMA
Lactic Acid, Venous: 1.4 mmol/L (ref 0.5–2.0)
Lactic Acid, Venous: 1.3 mmol/L (ref 0.5–2.0)

## 2015-02-20 LAB — APTT: APTT: 47 s — AB (ref 24–36)

## 2015-02-20 LAB — RAPID INFLUENZA A&B ANTIGENS: Influenza B (ARMC): NEGATIVE

## 2015-02-20 LAB — RAPID INFLUENZA A&B ANTIGENS (ARMC ONLY): INFLUENZA A (ARMC): NEGATIVE

## 2015-02-20 LAB — MRSA PCR SCREENING: MRSA by PCR: NEGATIVE

## 2015-02-20 MED ORDER — CARBAMIDE PEROXIDE 6.5 % OT SOLN
2.0000 [drp] | OTIC | Status: DC
Start: 2015-02-23 — End: 2015-03-02
  Administered 2015-02-26 – 2015-03-02 (×2): 2 [drp] via OTIC
  Filled 2015-02-20: qty 15

## 2015-02-20 MED ORDER — ACETAMINOPHEN 650 MG RE SUPP: 650.0000 mg | Freq: Four times a day (QID) | RECTAL | Status: DC | PRN

## 2015-02-20 MED ORDER — PSYLLIUM 95 % PO PACK
1.0000 | PACK | Freq: Three times a day (TID) | ORAL | Status: DC
  Administered 2015-02-20 – 2015-03-02 (×26): 1 via ORAL
  Filled 2015-02-20 (×22): qty 1

## 2015-02-20 MED ORDER — BISACODYL 5 MG PO TBEC: 5.0000 mg | DELAYED_RELEASE_TABLET | Freq: Every day | ORAL | Status: DC | PRN

## 2015-02-20 MED ORDER — PIPERACILLIN-TAZOBACTAM 3.375 G IVPB
3.3750 g | Freq: Three times a day (TID) | INTRAVENOUS | Status: DC
  Administered 2015-02-20 – 2015-02-21 (×2): 3.375 g via INTRAVENOUS
  Filled 2015-02-20 (×4): qty 50

## 2015-02-20 MED ORDER — MORPHINE SULFATE (PF) 2 MG/ML IV SOLN
2.0000 mg | Freq: Once | INTRAVENOUS | Status: AC
  Administered 2015-02-20: 2 mg via INTRAVENOUS
  Filled 2015-02-20: qty 1

## 2015-02-20 MED ORDER — WARFARIN - PHYSICIAN DOSING INPATIENT: Freq: Every day | Status: DC

## 2015-02-20 MED ORDER — SENNOSIDES-DOCUSATE SODIUM 8.6-50 MG PO TABS
1.0000 | ORAL_TABLET | Freq: Every evening | ORAL | Status: DC | PRN
  Administered 2015-02-22: 1 via ORAL
  Filled 2015-02-20 (×2): qty 1

## 2015-02-20 MED ORDER — ONDANSETRON HCL 4 MG PO TABS: 4.0000 mg | ORAL_TABLET | Freq: Four times a day (QID) | ORAL | Status: DC | PRN

## 2015-02-20 MED ORDER — ACETAMINOPHEN 325 MG PO TABS
650.0000 mg | ORAL_TABLET | Freq: Once | ORAL | Status: AC
  Administered 2015-02-20: 650 mg via ORAL
  Filled 2015-02-20: qty 2

## 2015-02-20 MED ORDER — OCUVITE-LUTEIN PO CAPS
1.0000 | ORAL_CAPSULE | Freq: Two times a day (BID) | ORAL | Status: DC
  Administered 2015-02-20 – 2015-03-02 (×20): 1 via ORAL
  Filled 2015-02-20 (×20): qty 1

## 2015-02-20 MED ORDER — TRAZODONE HCL 50 MG PO TABS
150.0000 mg | ORAL_TABLET | Freq: Every day | ORAL | Status: DC
  Administered 2015-02-20 – 2015-03-01 (×10): 150 mg via ORAL
  Filled 2015-02-20 (×11): qty 1

## 2015-02-20 MED ORDER — SODIUM CHLORIDE 0.9 % IV BOLUS (SEPSIS)
500.0000 mL | Freq: Once | INTRAVENOUS | Status: AC
  Administered 2015-02-20: 500 mL via INTRAVENOUS

## 2015-02-20 MED ORDER — LEVOFLOXACIN IN D5W 500 MG/100ML IV SOLN
500.0000 mg | Freq: Once | INTRAVENOUS | Status: AC
  Administered 2015-02-20: 500 mg via INTRAVENOUS
  Filled 2015-02-20: qty 100

## 2015-02-20 MED ORDER — LOSARTAN POTASSIUM 50 MG PO TABS
100.0000 mg | ORAL_TABLET | Freq: Every day | ORAL | Status: DC
Start: 2015-02-20 — End: 2015-03-02
  Administered 2015-02-21 – 2015-03-02 (×9): 100 mg via ORAL
  Filled 2015-02-20 (×9): qty 2

## 2015-02-20 MED ORDER — ALUM & MAG HYDROXIDE-SIMETH 200-200-20 MG/5ML PO SUSP: 30.0000 mL | Freq: Four times a day (QID) | ORAL | Status: DC | PRN

## 2015-02-20 MED ORDER — INSULIN GLARGINE 100 UNIT/ML ~~LOC~~ SOLN
43.0000 [IU] | Freq: Every day | SUBCUTANEOUS | Status: DC
  Administered 2015-02-21 – 2015-02-25 (×5): 43 [IU] via SUBCUTANEOUS
  Filled 2015-02-20 (×6): qty 0.43

## 2015-02-20 MED ORDER — ATORVASTATIN CALCIUM 20 MG PO TABS
80.0000 mg | ORAL_TABLET | Freq: Every day | ORAL | Status: DC
  Administered 2015-02-20 – 2015-03-01 (×10): 80 mg via ORAL
  Filled 2015-02-20 (×10): qty 4

## 2015-02-20 MED ORDER — CALCIUM CARBONATE-VITAMIN D 500-200 MG-UNIT PO TABS
1.0000 | ORAL_TABLET | Freq: Two times a day (BID) | ORAL | Status: DC
  Administered 2015-02-20 – 2015-03-02 (×20): 1 via ORAL
  Filled 2015-02-20 (×20): qty 1

## 2015-02-20 MED ORDER — POLYETHYLENE GLYCOL 3350 17 G PO PACK
17.0000 g | PACK | Freq: Two times a day (BID) | ORAL | Status: DC
  Administered 2015-02-20 – 2015-03-02 (×19): 17 g via ORAL
  Filled 2015-02-20 (×17): qty 1

## 2015-02-20 MED ORDER — GABAPENTIN 300 MG PO CAPS
300.0000 mg | ORAL_CAPSULE | Freq: Three times a day (TID) | ORAL | Status: DC
  Administered 2015-02-20 – 2015-02-24 (×11): 300 mg via ORAL
  Filled 2015-02-20 (×11): qty 1

## 2015-02-20 MED ORDER — METOPROLOL TARTRATE 50 MG PO TABS
50.0000 mg | ORAL_TABLET | Freq: Two times a day (BID) | ORAL | Status: DC
  Administered 2015-02-20 – 2015-02-25 (×10): 50 mg via ORAL
  Filled 2015-02-20 (×10): qty 1

## 2015-02-20 MED ORDER — ACETAMINOPHEN 325 MG PO TABS
650.0000 mg | ORAL_TABLET | Freq: Four times a day (QID) | ORAL | Status: DC | PRN
  Administered 2015-02-21 – 2015-03-02 (×5): 650 mg via ORAL
  Filled 2015-02-20 (×5): qty 2

## 2015-02-20 MED ORDER — ACETAMINOPHEN 325 MG PO TABS
650.0000 mg | ORAL_TABLET | ORAL | Status: DC | PRN
Start: 2015-02-20 — End: 2015-02-20

## 2015-02-20 MED ORDER — MICONAZOLE NITRATE 2 % EX SOLN: 1.0000 "application " | Freq: Two times a day (BID) | CUTANEOUS | Status: DC

## 2015-02-20 MED ORDER — HYDROCODONE-ACETAMINOPHEN 5-325 MG PO TABS
1.0000 | ORAL_TABLET | ORAL | Status: DC | PRN
  Administered 2015-02-21 – 2015-02-24 (×2): 1 via ORAL
  Filled 2015-02-20 (×2): qty 1

## 2015-02-20 MED ORDER — AMLODIPINE BESYLATE 10 MG PO TABS
10.0000 mg | ORAL_TABLET | Freq: Every day | ORAL | Status: DC
  Administered 2015-02-21 – 2015-03-02 (×10): 10 mg via ORAL
  Filled 2015-02-20 (×10): qty 1

## 2015-02-20 MED ORDER — ONDANSETRON HCL 4 MG/2ML IJ SOLN
4.0000 mg | Freq: Once | INTRAMUSCULAR | Status: AC
  Administered 2015-02-20: 4 mg via INTRAVENOUS
  Filled 2015-02-20: qty 2

## 2015-02-20 MED ORDER — SODIUM CHLORIDE 0.9 % IV SOLN
INTRAVENOUS | Status: DC
  Administered 2015-02-20 – 2015-02-22 (×3): via INTRAVENOUS

## 2015-02-20 MED ORDER — INSULIN ASPART 100 UNIT/ML ~~LOC~~ SOLN
0.0000 [IU] | Freq: Every day | SUBCUTANEOUS | Status: DC
  Administered 2015-02-21 – 2015-02-24 (×3): 2 [IU] via SUBCUTANEOUS
  Administered 2015-02-25: 3 [IU] via SUBCUTANEOUS
  Administered 2015-02-26: 4 [IU] via SUBCUTANEOUS
  Administered 2015-02-27: 3 [IU] via SUBCUTANEOUS
  Administered 2015-02-28: 5 [IU] via SUBCUTANEOUS
  Administered 2015-03-01: 3 [IU] via SUBCUTANEOUS
  Filled 2015-02-20: qty 2
  Filled 2015-02-20: qty 5
  Filled 2015-02-20: qty 2
  Filled 2015-02-20: qty 4
  Filled 2015-02-20: qty 3
  Filled 2015-02-20: qty 2
  Filled 2015-02-20 (×2): qty 3

## 2015-02-20 MED ORDER — INSULIN ASPART 100 UNIT/ML ~~LOC~~ SOLN
0.0000 [IU] | Freq: Three times a day (TID) | SUBCUTANEOUS | Status: DC
  Administered 2015-02-21: 3 [IU] via SUBCUTANEOUS
  Administered 2015-02-21: 1 [IU] via SUBCUTANEOUS
  Administered 2015-02-21: 2 [IU] via SUBCUTANEOUS
  Administered 2015-02-22: 3 [IU] via SUBCUTANEOUS
  Administered 2015-02-22: 1 [IU] via SUBCUTANEOUS
  Administered 2015-02-22: 3 [IU] via SUBCUTANEOUS
  Administered 2015-02-23: 2 [IU] via SUBCUTANEOUS
  Administered 2015-02-23: 3 [IU] via SUBCUTANEOUS
  Administered 2015-02-24 (×2): 2 [IU] via SUBCUTANEOUS
  Administered 2015-02-25: 1 [IU] via SUBCUTANEOUS
  Administered 2015-02-25: 7 [IU] via SUBCUTANEOUS
  Administered 2015-02-25: 5 [IU] via SUBCUTANEOUS
  Administered 2015-02-26: 7 [IU] via SUBCUTANEOUS
  Administered 2015-02-26: 5 [IU] via SUBCUTANEOUS
  Administered 2015-02-26: 3 [IU] via SUBCUTANEOUS
  Administered 2015-02-27: 4 [IU] via SUBCUTANEOUS
  Administered 2015-02-27: 7 [IU] via SUBCUTANEOUS
  Administered 2015-02-27: 5 [IU] via SUBCUTANEOUS
  Administered 2015-02-28: 3 [IU] via SUBCUTANEOUS
  Administered 2015-02-28: 2 [IU] via SUBCUTANEOUS
  Administered 2015-02-28 – 2015-03-01 (×2): 7 [IU] via SUBCUTANEOUS
  Administered 2015-03-01: 3 [IU] via SUBCUTANEOUS
  Administered 2015-03-01: 5 [IU] via SUBCUTANEOUS
  Administered 2015-03-02: 9 [IU] via SUBCUTANEOUS
  Administered 2015-03-02: 3 [IU] via SUBCUTANEOUS
  Filled 2015-02-20 (×2): qty 3
  Filled 2015-02-20: qty 1
  Filled 2015-02-20: qty 5
  Filled 2015-02-20: qty 2
  Filled 2015-02-20: qty 3
  Filled 2015-02-20 (×2): qty 2
  Filled 2015-02-20: qty 9
  Filled 2015-02-20: qty 7
  Filled 2015-02-20: qty 3
  Filled 2015-02-20: qty 2
  Filled 2015-02-20: qty 5
  Filled 2015-02-20: qty 1
  Filled 2015-02-20: qty 2
  Filled 2015-02-20: qty 7
  Filled 2015-02-20: qty 2
  Filled 2015-02-20 (×2): qty 3
  Filled 2015-02-20: qty 7
  Filled 2015-02-20: qty 5
  Filled 2015-02-20: qty 1
  Filled 2015-02-20: qty 7
  Filled 2015-02-20 (×2): qty 3
  Filled 2015-02-20: qty 5
  Filled 2015-02-20: qty 7

## 2015-02-20 MED ORDER — INSULIN ASPART 100 UNIT/ML ~~LOC~~ SOLN: 0.0000 [IU] | Freq: Three times a day (TID) | SUBCUTANEOUS | Status: DC

## 2015-02-20 MED ORDER — QUETIAPINE FUMARATE 25 MG PO TABS
50.0000 mg | ORAL_TABLET | Freq: Every day | ORAL | Status: DC
  Administered 2015-02-20 – 2015-03-01 (×9): 50 mg via ORAL
  Filled 2015-02-20 (×9): qty 2

## 2015-02-20 MED ORDER — ONDANSETRON HCL 4 MG/2ML IJ SOLN
4.0000 mg | Freq: Four times a day (QID) | INTRAMUSCULAR | Status: DC | PRN
Start: 2015-02-20 — End: 2015-03-02

## 2015-02-20 MED ORDER — HYDROMORPHONE HCL 1 MG/ML IJ SOLN
0.5000 mg | INTRAMUSCULAR | Status: DC | PRN
  Administered 2015-02-20 – 2015-02-25 (×6): 0.5 mg via INTRAVENOUS
  Filled 2015-02-20 (×6): qty 1

## 2015-02-20 MED ORDER — WARFARIN SODIUM 5 MG PO TABS
7.5000 mg | ORAL_TABLET | Freq: Every evening | ORAL | Status: DC
  Administered 2015-02-20 – 2015-02-22 (×3): 7.5 mg via ORAL
  Filled 2015-02-20 (×3): qty 2

## 2015-02-20 MED ORDER — VITAMIN B-12 1000 MCG PO TABS
1000.0000 ug | ORAL_TABLET | Freq: Every day | ORAL | Status: DC
  Administered 2015-02-21 – 2015-03-02 (×10): 1000 ug via ORAL
  Filled 2015-02-20 (×10): qty 1

## 2015-02-20 NOTE — H&P (Signed)
[facility] [place] NAME: Bobby Jacobs    MR#:  [id number]  DATE OF BIRTH:  Nov 04, 1940  DATE OF ADMISSION:  02/20/2015  PRIMARY CARE PHYSICIAN: Bobby Jacobs., PA   REQUESTING/REFERRING PHYSICIAN: Dr. Burlene Arnt  CHIEF COMPLAINT:   Pneumonia and fall HISTORY OF PRESENT ILLNESS:  Bobby Jacobs  is a 75 y.o. male with a known history of dementia and diabetes who presents with above complaint. Patient had cough and wheezing a few days ago and was requested for outpatient chest x-ray. Outpatient chest x-ray was ordered which shows left lower lobe pneumonia. During the x-ray the patient fell off the stool landing on his right ribs. He presents to the emergency room due to rib pain pain. X-ray does confirm rib fractures. Patient has dementia. His baseline mental status is that he is cognitively aware of his surroundings but has confusion to short-term memory. He does become confused at times but does not become agitated as per his wife was at bedside. He has a chronic Foley catheter placed and I have asked ER physician to change this.  PAST MEDICAL HISTORY:   Past Medical History  Diagnosis Date  . Hypertension   . Skin cancer   . Coronary artery disease   . Depression   . GERD (gastroesophageal reflux disease)   . Cardiovascular disease   . High cholesterol   . Pneumonia   . Fracture   . Obesity   . Sleep apnea   . CHF (congestive heart failure) ([place])   . Dementia   . Atrial fibrillation ([place])   . Urinary retention   . Urine frequency   . Incomplete bladder emptying   . Diabetes mellitus, type 2 ([place])   . Stroke ([place])   . Peripheral vascular disease ([place])   . Aortic aneurysm ([place])   . Aortic valve replaced   . Hyperthyroidism     PAST SURGICAL HISTORY:   Past Surgical History  Procedure Laterality Date  . Hernia repair    . Cardiac surgery    . Neck surgery    . Degenerative disk disease    . Circumcision    .  Coronary artery bypass graft    . Suprapubic catheter surgery  09/22/2014    placement    SOCIAL HISTORY:   Social History  Substance Use Topics  . Smoking status: Former Research scientist (life sciences)  . Smokeless tobacco: Former Systems developer    Quit date: 09/21/2012     Comment: quit one year ago  . Alcohol Use: No    FAMILY HISTORY:   Family History  Problem Relation Age of Onset  . Cancer Neg Hx     Kidney,Bladder ,Prostate    DRUG ALLERGIES:  No Known Allergies   REVIEW OF SYSTEMS:  Patient with dementia and confusion  MEDICATIONS AT HOME:   Prior to Admission medications   Medication Sig Start Date End Date Taking? Authorizing Provider  amLODipine (NORVASC) 10 MG tablet Take 10 mg by mouth daily.   Yes Historical Provider, MD  Insulin Glargine (TOUJEO SOLOSTAR) 300 UNIT/ML SOPN Inject 43 Units into the skin daily.   Yes Historical Provider, MD  losartan (COZAAR) 100 MG tablet Take 100 mg by mouth daily.    Yes Historical Provider, MD  polyethylene glycol (MIRALAX / GLYCOLAX) packet Take 17 g by mouth 2 (two) times daily.   Yes Historical Provider, MD  ranitidine (ZANTAC) 150 MG tablet Take 150 mg by mouth daily.  Yes Historical Provider, MD  vitamin B-12 (CYANOCOBALAMIN) 1000 MCG tablet Take 1,000 mcg by mouth daily.   Yes Historical Provider, MD  atorvastatin (LIPITOR) 80 MG tablet Take 80 mg by mouth daily at 6 PM.     Historical Provider, MD  Calcium Carb-Cholecalciferol (CALCIUM-VITAMIN D) 600-400 MG-UNIT TABS Take 1 tablet by mouth 2 (two) times daily.    Historical Provider, MD  carbamide peroxide (DEBROX) 6.5 % otic solution Place 5 drops into both ears 2 (two) times a week.     Historical Provider, MD  cephALEXin (KEFLEX) 500 MG capsule Take 500 mg by mouth 4 (four) times daily. Reported on 02/05/2015    Historical Provider, MD  clotrimazole (LOTRIMIN) 1 % cream Apply 1 application topically 2 (two) times daily. APPLY TO PENIS 10/24/14 10/24/15  Historical Provider, MD  Cranberry 250 MG  TABS Take by mouth twice daily 01/30/15   Larene Beach A McGowan, PA-C  fosfomycin (MONUROL) 3 G PACK Take 3 g by mouth once. Patient not taking: Reported on 01/30/2015 10/17/14   Larene Beach A McGowan, PA-C  gabapentin (NEURONTIN) 300 MG capsule Take 300 mg by mouth 3 (three) times daily.    Historical Provider, MD  Cleda Clarks 100 UNIT/ML KiwkPen  01/27/15   Historical Provider, MD  Incontinence Supplies (BARD PLASTIC TUBING/ADAPTER) MISC Use with foley cath 11/20/14   Larene Beach A McGowan, PA-C  Lactobacillus (ACIDOPHILUS PROBIOTIC) 100 MG CAPS Take on tablet twice daily 01/30/15   Larene Beach A McGowan, PA-C  metoprolol (LOPRESSOR) 50 MG tablet Take 50 mg by mouth 2 (two) times daily.    Historical Provider, MD  Miconazole Nitrate (FUNGOID TINCTURE) 2 % SOLN Apply 1 application topically 2 (two) times daily as needed (FOR AFFECTED AREAS).    Historical Provider, MD  Misc Natural Products (FIBER 7) POWD Take 1 scoop by mouth 3 (three) times daily.    Historical Provider, MD  Multiple Vitamins-Minerals (PRESERVISION AREDS 2) CAPS Take 1 capsule by mouth 2 (two) times daily.    Historical Provider, MD  psyllium (METAMUCIL SMOOTH TEXTURE) 28 % packet Take 1 packet by mouth 3 (three) times daily. HOLD FOR DIARRHEA    Historical Provider, MD  QUEtiapine (SEROQUEL) 50 MG tablet Take 50 mg by mouth at bedtime.    Historical Provider, MD  traZODone (DESYREL) 100 MG tablet Take 150 mg by mouth at bedtime.     Historical Provider, MD  warfarin (COUMADIN) 7.5 MG tablet Take 7.5 mg by mouth daily.    Historical Provider, MD      VITAL SIGNS:  Blood pressure 131/56, pulse 70, temperature 101.9 F (38.8 C), temperature source Oral, resp. rate 14, height 5\' 10"  (1.778 m), weight 88.451 kg (195 lb), SpO2 93 %.  PHYSICAL EXAMINATION:  GENERAL:  75 y.o.-year-old patient lying in the bed with no acute distress.  EYES: Pupils equal, round, reactive to light and accommodation. No scleral icterus. He does not follow directions very  well. HEENT: Head atraumatic, normocephalic. Oropharynx clear.  NECK:  Supple, no jugular venous distention. No thyroid enlargement, no tenderness.  LUNGS: Decreased breath sounds right base left base with wheezing and crackles No use of accessory muscles of respiration.  CARDIOVASCULAR: S1, S2 normal. No murmurs, rubs, or gallops.  ABDOMEN: Soft, nontender, nondistended. Bowel sounds present. No organomegaly or mass.  EXTREMITIES: No pedal edema, cyanosis, or clubbing.  NEUROLOGIC: Cranial nerves II through XII are grossly intact. No focal deficits. PSYCHIATRIC: The patient is alert and oriented x name he is trying to  chew on the O2 probe SKIN: No obvious rash, lesion, or ulcer.   LABORATORY PANEL:   CBC  Recent Labs Lab 02/20/15 1439  WBC 14.1*  HGB 12.4*  HCT 37.2*  PLT 185   ------------------------------------------------------------------------------------------------------------------  Chemistries   Recent Labs Lab 02/20/15 1551  NA 128*  K 4.3  CL 96*  CO2 24  GLUCOSE 191*  BUN 39*  CREATININE 1.70*  CALCIUM 8.3*  AST 23  ALT 19  ALKPHOS 76  BILITOT 1.1   ------------------------------------------------------------------------------------------------------------------  Cardiac Enzymes No results for input(s): TROPONINI in the last 168 hours. ------------------------------------------------------------------------------------------------------------------  RADIOLOGY:  Dg Chest 1 View  02/20/2015  CLINICAL DATA:  Mildly productive cough and wheezing for the past 2 days ; EXAM: CHEST 1 VIEW PA with one view lateral chest x-ray. The images are duplicated in PACs. The PA was performed at the [facility] outpatient facility but the patient then faint it and was transported to a [facility] where the lateral was performed. COMPARISON:  PA and lateral chest x-ray of November 05, 2014 FINDINGS: The lungs are adequately inflated. There is mildly increased density in the left  lower lobe posteriorly. There is no pleural effusion. The heart is top-normal in size. The pulmonary vascularity is normal. The 7 sternal wires are intact. 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 There is calcification in the wall of the thoracic aorta. The bony thorax exhibits no acute abnormality. IMPRESSION: Subsegmental atelectasis or early pneumonia in the left lower lobe. There is no evidence of pulmonary edema. Followup PA and lateral chest X-ray is recommended in 3-4 weeks following trial of antibiotic therapy to ensure resolution and exclude underlying malignancy. Electronically Signed   By: David  Martinique M.D.   On: 02/20/2015 12:05   Dg Chest 1 View  02/20/2015  CLINICAL DATA:  Mildly productive cough and wheezing for the past 2 days ; EXAM: CHEST 1 VIEW PA with one view lateral chest x-ray. The images are duplicated in PACs. The PA was performed at the [facility] outpatient facility but the patient then faint it and was transported to a [facility] where the lateral was performed. COMPARISON:  PA and lateral chest x-ray of November 05, 2014 FINDINGS: The lungs are adequately inflated. There is mildly increased density in the left lower lobe posteriorly. There is no pleural effusion. The heart is top-normal in size. The pulmonary vascularity is normal. The 7 sternal wires are intact. 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 There is calcification in the wall of the thoracic aorta. The bony thorax exhibits no acute abnormality. IMPRESSION: Subsegmental atelectasis or early pneumonia in the left lower lobe. There is no evidence of pulmonary edema. Followup PA and lateral chest X-ray is recommended in 3-4 weeks following trial of antibiotic therapy to ensure resolution and exclude underlying malignancy. Electronically Signed   By: David  Martinique M.D.   On: 02/20/2015 12:05   Dg Ribs Unilateral Right  02/20/2015  CLINICAL DATA:  Right-sided rib pain following fall, initial encounter EXAM: RIGHT RIBS - 2  VIEW COMPARISON:  None. FINDINGS: Mildly displaced fractures of the eighth, ninth and tenth ribs are noted on the right posteriorly. No other fractures are seen. No underlying pneumothorax is noted. IMPRESSION: Multiple rib fractures posteriorly on the right consistent with the recent injury. No pneumothorax noted. Electronically Signed   By: Inez Catalina M.D.   On: 02/20/2015 12:32   Ct Head Wo Contrast  02/20/2015  CLINICAL DATA:  Fall today, on Coumadin.  History of stroke.  EXAM: CT HEAD WITHOUT CONTRAST TECHNIQUE: Contiguous axial images were obtained from the base of the skull through the vertex without intravenous contrast. COMPARISON:  08/03/2013 FINDINGS: There is atrophy and chronic small vessel disease changes. Old right occipital infarct. No acute intracranial abnormality. Specifically, no hemorrhage, hydrocephalus, mass lesion, acute infarction, or significant intracranial injury. No acute calvarial abnormality. Visualized paranasal sinuses and mastoids clear. Orbital soft tissues unremarkable. IMPRESSION: Old right occipital infarct. No acute intracranial abnormality. Atrophy, chronic microvascular disease. Electronically Signed   By: Rolm Baptise M.D.   On: 02/20/2015 16:46    EKG:   Atrial fibrillation  IMPRESSION AND PLAN:   75 year male with dementia and atrial fibrillation on anticoagulation who initially presented for an outpatient chest x-ray for cough and subsequently fell due to weakness and now separated rib fracture.  1. Healthcare acquired pneumonia: [facility] and therefore I will treat pneumonia with Zosyn.  2. Rib fracture: I have ordered ISS and continue supportive care with pain medication.  3. Dementia: Observe patient for signs of delirium. Continued oriented patient to name, time and place. Continue Seroquel 4. Diabetes: Patient will be placed on sliding scale insulin and ADA diet. Continue insulin glargine 5. Atrial  fibrillation: CT head was negative for acute bleed after fall. Patient did not fall on his head. Continue Coumadin and metoprolol for rate control. 6. Essential hypertension: Continue metoprolol, Norvasc and losartan  All the records are reviewed and case discussed with ED provider. Management plans discussed with the patient's wife at bedside and she is in agreement.  CODE STATUS: DO NOT RESUSCITATE  TOTAL TIME TAKING CARE OF THIS PATIENT: 50 minutes.    Nyra Anspaugh M.D on 02/20/2015 at 5:23 PM  Between 7am to 6pm - Pager - [phone] After 6pm go to [email or web address] - password [email or web address] [facility]  [facility]  Office  [phone]  CC: Primary care physician; Bobby Jacobs., PA

## 2015-02-20 NOTE — Progress Notes (Signed)
ANTIBIOTIC CONSULT NOTE - INITIAL  Pharmacy Consult for Zosyn  Indication: pneumonia  No Known Allergies  Patient Measurements: Height: 5\' 10"  (177.8 cm) Weight: 195 lb (88.451 kg) IBW/kg (Calculated) : 73 Adjusted Body Weight:   Vital Signs: Temp: 100.1 F (37.8 C) (01/27 2020) Temp Source: Axillary (01/27 2020) BP: 139/88 mmHg (01/27 2020) Pulse Rate: 101 (01/27 2020) Intake/Output from previous day:   Intake/Output from this shift:    Labs:  Recent Labs  02/20/15 1439 02/20/15 1551  WBC 14.1*  --   HGB 12.4*  --   PLT 185  --   CREATININE  --  1.70*   Estimated Creatinine Clearance: 41.4 mL/min (by C-G formula based on Cr of 1.7). No results for input(s): VANCOTROUGH, VANCOPEAK, VANCORANDOM, GENTTROUGH, GENTPEAK, GENTRANDOM, TOBRATROUGH, TOBRAPEAK, TOBRARND, AMIKACINPEAK, AMIKACINTROU, AMIKACIN in the last 72 hours.   Microbiology: Recent Results (from the past 720 hour(s))  Rapid Influenza A&B Antigens ([place] only)     Status: None   Collection Time: 02/20/15  5:20 PM  Result Value Ref Range Status   Influenza A [facility]) NEGATIVE  Final   Influenza B [facility]) NEGATIVE  Final    Medical History: Past Medical History  Diagnosis Date  . Hypertension   . Skin cancer   . Coronary artery disease   . Depression   . GERD (gastroesophageal reflux disease)   . Cardiovascular disease   . High cholesterol   . Pneumonia   . Fracture   . Obesity   . Sleep apnea   . CHF (congestive heart failure) ([place])   . Dementia   . Atrial fibrillation ([place])   . Urinary retention   . Urine frequency   . Incomplete bladder emptying   . Diabetes mellitus, type 2 ([place])   . Stroke ([place])   . Peripheral vascular disease ([place])   . Aortic aneurysm ([place])   . Aortic valve replaced   . Hyperthyroidism     Medications:  Prescriptions prior to admission  Medication Sig Dispense Refill Last Dose  . acetaminophen (TYLENOL) 325 MG tablet Take 650 mg by mouth every 4 (four) hours as  needed for mild pain, fever or headache.   PRN at PRN  . amLODipine (NORVASC) 10 MG tablet Take 10 mg by mouth daily.   unknown at unknown   . atorvastatin (LIPITOR) 80 MG tablet Take 80 mg by mouth at bedtime.    unknown at unknown   . Calcium Carb-Cholecalciferol (CALCIUM-VITAMIN D) 600-400 MG-UNIT TABS Take 1 tablet by mouth 2 (two) times daily.   unknown at unknown   . carbamide peroxide (DEBROX) 6.5 % otic solution Place 2 drops into both ears 2 (two) times a week. Pt uses on Tuesday and Friday.   unknown at unknown   . clotrimazole (LOTRIMIN) 1 % cream Apply 1 application topically 2 (two) times daily as needed (for itching).    PRN at PRN  . gabapentin (NEURONTIN) 300 MG capsule Take 300 mg by mouth 3 (three) times daily.   unknown at unknown   . Insulin Glargine (TOUJEO SOLOSTAR) 300 UNIT/ML SOPN Inject 43 Units into the skin daily.   unknown at unknown   . insulin lispro (HUMALOG) 100 UNIT/ML injection Inject 5 Units into the skin 3 (three) times daily with meals.   unknown at unknown   . losartan (COZAAR) 100 MG tablet Take 100 mg by mouth daily.    unknown at unknown   . metoprolol (LOPRESSOR) 50 MG tablet Take 50 mg by  mouth 2 (two) times daily.   unknown at unknown   . Miconazole Nitrate (FUNGOID TINCTURE) 2 % SOLN Apply 1 application topically 2 (two) times daily.    unknown at unknown   . Multiple Vitamins-Minerals (PRESERVISION AREDS 2) CAPS Take 1 capsule by mouth 2 (two) times daily.   unknown at unknown   . polyethylene glycol (MIRALAX / GLYCOLAX) packet Take 17 g by mouth 2 (two) times daily.   unknown at unknown   . psyllium (HYDROCIL/METAMUCIL) 95 % PACK Take 1 packet by mouth 3 (three) times daily.   unknown at unknown   . QUEtiapine (SEROQUEL) 50 MG tablet Take 50 mg by mouth at bedtime.   unknown at unknown   . ranitidine (ZANTAC) 150 MG tablet Take 150 mg by mouth daily.   unknown at unknown   . traZODone (DESYREL) 50 MG tablet Take 150 mg by mouth at bedtime.   unknown at  unknown   . vitamin B-12 (CYANOCOBALAMIN) 1000 MCG tablet Take 1,000 mcg by mouth daily.   unknown at unknown   . warfarin (COUMADIN) 7.5 MG tablet Take 7.5 mg by mouth every evening.    unknown at unknown   . fosfomycin (MONUROL) 3 G PACK Take 3 g by mouth once. (Patient not taking: Reported on 01/30/2015) 3 g 0 Not Taking   Assessment: CrCl = 41.4 ml/min  No Pseudomonas risk factors noted.   Goal of Therapy:  resolution of infection   Plan:  Expected duration 7 days with resolution of temperature and/or normalization of WBC  Zosyn 3.375 gm IV Q8H EI ordered to start 1/27 @ 21:00.   Saryah Loper D 02/20/2015,9:05 PM

## 2015-02-20 NOTE — ED Notes (Signed)
Roderic Palau PA notified of temperature.

## 2015-02-20 NOTE — Progress Notes (Signed)
Report called to receiving nurse Juliette Mangle.

## 2015-02-20 NOTE — ED Provider Notes (Signed)
----------------------------------------- 4:53 PM on 02/20/2015 -----------------------------------------   [facility] Emergency Department Provider Note  ____________________________________________   I have reviewed the triage vital signs and the nursing notes.   HISTORY  Chief Complaint Pleurisy    HPI Susumu Pecor is a 75 y.o. male  is a 61 her old male who presents today complaining of rib pain. He had a witnessed fall while getting a chest x-ray. Patient was given a chest x-ray for cough and wheeze. The chest x-ray shows a left-sided pneumonia. Patient was sent over here however because when he got the x-ray he fell and was concerning to the people who is with him that he might have broken some ribs. Patient did not hit his head, he is on Coumadin. He states it hurts to breathe with a rib pain on the right. He was seen and evaluated by the PA here, who transferred him to our more acute side for further evaluation and possible admission.  Past Medical History  Diagnosis Date  . Hypertension   . Skin cancer   . Coronary artery disease   . Depression   . GERD (gastroesophageal reflux disease)   . Cardiovascular disease   . High cholesterol   . Pneumonia   . Fracture   . Obesity   . Sleep apnea   . CHF (congestive heart failure) ([place])   . Dementia   . Atrial fibrillation ([place])   . Urinary retention   . Urine frequency   . Incomplete bladder emptying   . Diabetes mellitus, type 2 ([place])   . Stroke ([place])   . Peripheral vascular disease ([place])   . Aortic aneurysm ([place])   . Aortic valve replaced   . Hyperthyroidism     Patient Active Problem List   Diagnosis Date Noted  . 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 Urinary tract infectious disease 07/21/2014  . Incontinence 07/21/2014  . Urinary retention 07/18/2014  . BPH (benign prostatic hypertrophy) with urinary retention 07/18/2014  . Pure hypercholesterolemia 08/27/2013  . Type II or  unspecified type diabetes mellitus with neurological manifestations, uncontrolled 08/20/2013  . Anemia of other chronic disease 08/15/2013  . Atrial fibrillation ([place]) 08/08/2013  . Congestive heart failure, unspecified 08/08/2013  . Urinary tract infection, site not specified 08/08/2013  . Vascular dementia 08/08/2013  . Tremor 07/03/2013    Past Surgical History  Procedure Laterality Date  . Hernia repair    . Cardiac surgery    . Neck surgery    . Degenerative disk disease    . Circumcision    . Coronary artery bypass graft    . Suprapubic catheter surgery  09/22/2014    placement    Current Outpatient Rx  Name  Route  Sig  Dispense  Refill  . amLODipine (NORVASC) 10 MG tablet   Oral   Take 10 mg by mouth daily.         Marland Kitchen amoxicillin-clavulanate (AUGMENTIN) 875-125 MG tablet      Reported on 02/05/2015         . atorvastatin (LIPITOR) 80 MG tablet   Oral   Take 80 mg by mouth daily at 6 PM.          . Calcium Carb-Cholecalciferol (CALCIUM-VITAMIN D) 600-400 MG-UNIT TABS   Oral   Take 1 tablet by mouth 2 (two) times daily.         . carbamide peroxide (DEBROX) 6.5 % otic solution   Both Ears   Place 5 drops into  both ears 2 (two) times a week.          . cephALEXin (KEFLEX) 500 MG capsule   Oral   Take 500 mg by mouth 4 (four) times daily. Reported on 02/05/2015         . clotrimazole (LOTRIMIN) 1 % cream   Topical   Apply 1 application topically 2 (two) times daily. APPLY TO PENIS         . Cranberry 250 MG TABS      Take by mouth twice daily   60 tablet   0   . fosfomycin (MONUROL) 3 G PACK   Oral   Take 3 g by mouth once. Patient not taking: Reported on 01/30/2015   3 g   0   . gabapentin (NEURONTIN) 300 MG capsule   Oral   Take 300 mg by mouth 3 (three) times daily.         Marland Kitchen HUMALOG KWIKPEN 100 UNIT/ML KiwkPen                 Dispense as written.   . Incontinence Supplies (BARD PLASTIC TUBING/ADAPTER) MISC      Use with  foley cath   1 each   3   . Lactobacillus (ACIDOPHILUS PROBIOTIC) 100 MG CAPS      Take on tablet twice daily   60 capsule   12   . losartan (COZAAR) 100 MG tablet   Oral   Take 100 mg by mouth daily.          . metoprolol (LOPRESSOR) 50 MG tablet   Oral   Take 50 mg by mouth 2 (two) times daily.         . Miconazole Nitrate (FUNGOID TINCTURE) 2 % SOLN   Apply externally   Apply 1 application topically 2 (two) times daily as needed (FOR AFFECTED AREAS).         . Misc Natural Products (FIBER 7) POWD   Oral   Take 1 scoop by mouth 3 (three) times daily.         . Multiple Vitamins-Minerals (PRESERVISION AREDS 2) CAPS   Oral   Take 1 capsule by mouth 2 (two) times daily.         . polyethylene glycol (MIRALAX / GLYCOLAX) packet   Oral   Take 17 g by mouth 2 (two) times daily. Hold if loose stools   60 packet   0   . PRESCRIPTION MEDICATION   Inhalation   Inhale 1 application into the lungs at bedtime. BIPAP         . psyllium (METAMUCIL SMOOTH TEXTURE) 28 % packet   Oral   Take 1 packet by mouth 3 (three) times daily. HOLD FOR DIARRHEA         . QUEtiapine (SEROQUEL) 50 MG tablet   Oral   Take 50 mg by mouth at bedtime.         . ranitidine (ZANTAC) 150 MG capsule   Oral   Take 150 mg by mouth every morning.          Marland Kitchen TOUJEO SOLOSTAR 300 UNIT/ML SOPN   Oral   Take 43 Units by mouth every morning.            Dispense as written.   . traZODone (DESYREL) 100 MG tablet   Oral   Take 150 mg by mouth at bedtime.          . vitamin B-12 (CYANOCOBALAMIN) 1000 MCG tablet  Oral   Take 1,000 mcg by mouth daily.         Marland Kitchen warfarin (COUMADIN) 7.5 MG tablet   Oral   Take 7.5 mg by mouth daily.           Allergies Review of patient's allergies indicates no known allergies.  Family History  Problem Relation Age of Onset  . Cancer Neg Hx     Kidney,Bladder ,Prostate    Social History Social History  Substance Use Topics  .  Smoking status: Former Research scientist (life sciences)  . Smokeless tobacco: Former Systems developer    Quit date: 09/21/2012     Comment: quit one year ago  . Alcohol Use: No    Review of Systems Constitutional: No fever/chills Eyes: No visual changes. ENT: No sore throat. No stiff neck no neck pain Cardiovascular: + pain in the right back from fall. Marland Kitchen Respiratory: + shortness of breath. And cough  Gastrointestinal:   no vomiting.  No diarrhea.  No constipation. Genitourinary: Negative for dysuria. Musculoskeletal: Negative lower extremity swelling Skin: Negative for rash. Neurological: Negative for headaches, focal weakness or numbness. 10-point ROS otherwise negative.  ____________________________________________   PHYSICAL EXAM:  VITAL SIGNS: ED Triage Vitals  Enc Vitals Group     BP 02/20/15 1107 131/56 mmHg     Pulse Rate 02/20/15 1107 70     Resp 02/20/15 1107 18     Temp 02/20/15 1107 98.7 F (37.1 C)     Temp Source 02/20/15 1107 Oral     SpO2 02/20/15 1107 95 %     Weight 02/20/15 1107 195 lb (88.451 kg)     Height 02/20/15 1107 5\' 10"  (1.778 m)     Head Cir --      Peak Flow --      Pain Score 02/20/15 1113 5     Pain Loc --      Pain Edu? --      Excl. in [place]? --     Constitutional: Alert and pleasantly demented at baseline per family. . Nontoxic.  Elderly male.  Eyes: Conjunctivae are normal. PERRL. EOMI. Head: Atraumatic. Nose: No congestion/rhinnorhea. Mouth/Throat: Mucous membranes are moist.  Oropharynx non-erythematous. Neck: No stridor.   Nontender with no meningismus Cardiovascular: Normal rate, regular rhythm. Grossly normal heart sounds.  Good peripheral circulation. Respiratory: Ronchi on the L base.  Abdominal: Soft and nontender. No distention. No guarding no rebound Back:  + tender in the R back with no crepitous and no flail chest.  no midline tenderness there are no lesions noted. there is no CVA tenderness Musculoskeletal: No lower extremity tenderness. No joint  effusions, no DVT signs strong distal pulses no edema Neurologic:  Normal speech and language. No gross focal neurologic deficits are appreciated.  Skin:  Skin is warm, dry and intact. No rash noted. Psychiatric: Mood and affect are normal. Speech and behavior are normal.  ____________________________________________   LABS (all labs ordered are listed, but only abnormal results are displayed)  Labs Reviewed  CBC WITH DIFFERENTIAL/PLATELET - Abnormal; Notable for the following:    WBC 14.1 (*)    Hemoglobin 12.4 (*)    HCT 37.2 (*)    RDW 15.3 (*)    Neutro Abs 12.2 (*)    Lymphs Abs 0.6 (*)    Monocytes Absolute 1.3 (*)    All other components within normal limits  COMPREHENSIVE METABOLIC PANEL - Abnormal; Notable for the following:    Sodium 128 (*)    Chloride 96 (*)  Glucose, Bld 191 (*)    BUN 39 (*)    Creatinine, Ser 1.70 (*)    Calcium 8.3 (*)    Total Protein 6.2 (*)    Albumin 3.2 (*)    GFR calc non Af Amer 37 (*)    GFR calc Af Amer 43 (*)    All other components within normal limits  PROTIME-INR - Abnormal; Notable for the following:    Prothrombin Time 30.6 (*)    All other components within normal limits  APTT - Abnormal; Notable for the following:    aPTT 47 (*)    All other components within normal limits  CULTURE, BLOOD (ROUTINE X 2)  CULTURE, BLOOD (ROUTINE X 2)  URINE CULTURE  URINALYSIS COMPLETEWITH MICROSCOPIC ([facility] ONLY)  LACTIC ACID, PLASMA  LACTIC ACID, PLASMA  INFLUENZA PANEL BY PCR (TYPE A & B, H1N1)   ____________________________________________  EKG  I personally interpreted any EKGs ordered by me or triage afib with rate 96 no ST elevation or depression, PVC noted.  ____________________________________________  [id number]  I reviewed any imaging ordered by me or triage that were performed during my shift ____________________________________________   PROCEDURES  Procedure(s) performed: None  Critical Care performed:  None  ____________________________________________   INITIAL IMPRESSION / ASSESSMENT AND PLAN / ED COURSE  Pertinent labs & imaging results that were available during my care of the patient were reviewed by me and considered in my medical decision making (see chart for details).  Patient with multiple problems today. The first is his pneumonia. Patient has a fever here. We have given him Levaquin. We have sent him for the flu. The patient has resources and saturation. The second is that he had a witnessed fall apparently and suffered rib fractures which are seen on substance when x-ray. No evidence of pneumothorax clinically. Patient will require pain medication and admission for that we have given him pain medication here. The next problem for this patient is that he has fallen well on Coumadin. Given his history dementia did obtain a CT scan which is negative. Patient has a chronic indwelling Foley, we are sending a urine culture hopefully Levaquin will cover if it is in fact infected. Given multiple different pathologies present illness patient we will admit him for the hospitalist for further evaluation and treatment. ____________________________________________   FINAL CLINICAL IMPRESSION(S) / ED DIAGNOSES  Final diagnoses:  Left lower lobe pneumonia  Rib fractures, right, closed, initial encounter    Schuyler Amor, MD 02/20/15 1702

## 2015-02-20 NOTE — ED Notes (Signed)
Per care giver he fell from stool   Landed on right rib area.. Pain to right side   Was sent over for chest x-ray d/t congestion

## 2015-02-20 NOTE — ED Notes (Signed)
Pt was sent here for out patient chest xray for a cough he has had, states during the xray he fell over off the stool on his right side. States when they left he coughed and had pain  To the right rib area and they want to make sure there is no Fx.Bobby Jacobs

## 2015-02-20 NOTE — ED Notes (Signed)
Back from x-ray with care giver..moaning in pain with movement .unable to take a good breath d/t pain

## 2015-02-20 NOTE — ED Provider Notes (Signed)
[facility] Emergency Department Provider Note  ____________________________________________  Time seen: Approximately 11:48 AM  I have reviewed the triage vital signs and the nursing notes.   HISTORY  Chief Complaint Pleurisy    HPI Bobby Jacobs is a 75 y.o. male who presents emergency department complaining ofright-sided rib pain. Patient has had some cough and wheezing as 3 days and was being seen by his primary care provider for same. Outpatient x-ray was ordered. During x-ray the patient fell off of the stool landing on his right ribs. Patient does have a history of dementia but caregiver states that every time patient coughs now he complains of right rib pain. Patient and caregiver deny hitting head or losing consciousness. There is been no increase in difficulty breathing or increasing coughing. The patient's caregiver states that patient is being treated by primary care provider for cough and wheezing and that they are here specifically to rule out any fractures to ribs.   Past Medical History  Diagnosis Date  . Hypertension   . Skin cancer   . Coronary artery disease   . Depression   . GERD (gastroesophageal reflux disease)   . Cardiovascular disease   . High cholesterol   . Pneumonia   . Fracture   . Obesity   . Sleep apnea   . CHF (congestive heart failure) ([place])   . Dementia   . Atrial fibrillation ([place])   . Urinary retention   . Urine frequency   . Incomplete bladder emptying   . Diabetes mellitus, type 2 ([place])   . Stroke ([place])   . Peripheral vascular disease ([place])   . Aortic aneurysm ([place])   . Aortic valve replaced   . Hyperthyroidism     Patient Active Problem List   Diagnosis Date Noted  . 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 Urinary tract infectious disease 07/21/2014  . Incontinence 07/21/2014  . Urinary retention 07/18/2014  . BPH (benign prostatic hypertrophy) with urinary retention 07/18/2014  . Pure  hypercholesterolemia 08/27/2013  . Type II or unspecified type diabetes mellitus with neurological manifestations, uncontrolled 08/20/2013  . Anemia of other chronic disease 08/15/2013  . Atrial fibrillation ([place]) 08/08/2013  . Congestive heart failure, unspecified 08/08/2013  . Urinary tract infection, site not specified 08/08/2013  . Vascular dementia 08/08/2013  . Tremor 07/03/2013    Past Surgical History  Procedure Laterality Date  . Hernia repair    . Cardiac surgery    . Neck surgery    . Degenerative disk disease    . Circumcision    . Coronary artery bypass graft    . Suprapubic catheter surgery  09/22/2014    placement    Current Outpatient Rx  Name  Route  Sig  Dispense  Refill  . amLODipine (NORVASC) 10 MG tablet   Oral   Take 10 mg by mouth daily.         Marland Kitchen amoxicillin-clavulanate (AUGMENTIN) 875-125 MG tablet      Reported on 02/05/2015         . atorvastatin (LIPITOR) 80 MG tablet   Oral   Take 80 mg by mouth daily at 6 PM.          . Calcium Carb-Cholecalciferol (CALCIUM-VITAMIN D) 600-400 MG-UNIT TABS   Oral   Take 1 tablet by mouth 2 (two) times daily.         . carbamide peroxide (DEBROX) 6.5 % otic solution   Both Ears   Place 5 drops into both  ears 2 (two) times a week.          . cephALEXin (KEFLEX) 500 MG capsule   Oral   Take 500 mg by mouth 4 (four) times daily. Reported on 02/05/2015         . clotrimazole (LOTRIMIN) 1 % cream   Topical   Apply 1 application topically 2 (two) times daily. APPLY TO PENIS         . Cranberry 250 MG TABS      Take by mouth twice daily   60 tablet   0   . fosfomycin (MONUROL) 3 G PACK   Oral   Take 3 g by mouth once. Patient not taking: Reported on 01/30/2015   3 g   0   . gabapentin (NEURONTIN) 300 MG capsule   Oral   Take 300 mg by mouth 3 (three) times daily.         Marland Kitchen HUMALOG KWIKPEN 100 UNIT/ML KiwkPen                 Dispense as written.   . Incontinence Supplies  (BARD PLASTIC TUBING/ADAPTER) MISC      Use with foley cath   1 each   3   . Lactobacillus (ACIDOPHILUS PROBIOTIC) 100 MG CAPS      Take on tablet twice daily   60 capsule   12   . losartan (COZAAR) 100 MG tablet   Oral   Take 100 mg by mouth daily.          . metoprolol (LOPRESSOR) 50 MG tablet   Oral   Take 50 mg by mouth 2 (two) times daily.         . Miconazole Nitrate (FUNGOID TINCTURE) 2 % SOLN   Apply externally   Apply 1 application topically 2 (two) times daily as needed (FOR AFFECTED AREAS).         . Misc Natural Products (FIBER 7) POWD   Oral   Take 1 scoop by mouth 3 (three) times daily.         . Multiple Vitamins-Minerals (PRESERVISION AREDS 2) CAPS   Oral   Take 1 capsule by mouth 2 (two) times daily.         . polyethylene glycol (MIRALAX / GLYCOLAX) packet   Oral   Take 17 g by mouth 2 (two) times daily. Hold if loose stools   60 packet   0   . PRESCRIPTION MEDICATION   Inhalation   Inhale 1 application into the lungs at bedtime. BIPAP         . psyllium (METAMUCIL SMOOTH TEXTURE) 28 % packet   Oral   Take 1 packet by mouth 3 (three) times daily. HOLD FOR DIARRHEA         . QUEtiapine (SEROQUEL) 50 MG tablet   Oral   Take 50 mg by mouth at bedtime.         . ranitidine (ZANTAC) 150 MG capsule   Oral   Take 150 mg by mouth every morning.          Marland Kitchen TOUJEO SOLOSTAR 300 UNIT/ML SOPN   Oral   Take 43 Units by mouth every morning.            Dispense as written.   . traZODone (DESYREL) 100 MG tablet   Oral   Take 150 mg by mouth at bedtime.          . vitamin B-12 (CYANOCOBALAMIN) 1000 MCG tablet  Oral   Take 1,000 mcg by mouth daily.         Marland Kitchen warfarin (COUMADIN) 7.5 MG tablet   Oral   Take 7.5 mg by mouth daily.           Allergies Review of patient's allergies indicates no known allergies.  Family History  Problem Relation Age of Onset  . Cancer Neg Hx     Kidney,Bladder ,Prostate    Social  History Social History  Substance Use Topics  . Smoking status: Former Research scientist (life sciences)  . Smokeless tobacco: Former Systems developer    Quit date: 09/21/2012     Comment: quit one year ago  . Alcohol Use: No     Review of Systems  Constitutional: No fever/chills Eyes: No visual changes. No discharge ENT: No sore throat. Cardiovascular: Positive for right sided rib/chest pain. Respiratory: Positive for cough. Positive for wheezing. No SOB. Gastrointestinal: No abdominal pain.  No nausea, no vomiting.  No diarrhea.  No constipation. Genitourinary: Negative for dysuria. No hematuria Musculoskeletal: Negative for back pain. Skin: Negative for rash. Neurological: Negative for headaches, focal weakness or numbness. 10-point ROS otherwise negative.  ____________________________________________   PHYSICAL EXAM:  VITAL SIGNS: ED Triage Vitals  Enc Vitals Group     BP 02/20/15 1107 131/56 mmHg     Pulse Rate 02/20/15 1107 70     Resp 02/20/15 1107 18     Temp 02/20/15 1107 98.7 F (37.1 C)     Temp Source 02/20/15 1107 Oral     SpO2 02/20/15 1107 95 %     Weight 02/20/15 1107 195 lb (88.451 kg)     Height 02/20/15 1107 5\' 10"  (1.778 m)     Head Cir --      Peak Flow --      Pain Score 02/20/15 1113 5     Pain Loc --      Pain Edu? --      Excl. in [place]? --      Constitutional: Alert and oriented. Well appearing and in no acute distress. Eyes: Conjunctivae are normal. PERRL. EOMI. Head: Atraumatic. ENT:      Ears:       Nose: No congestion/rhinnorhea.      Mouth/Throat: Mucous membranes are moist.  Neck: No stridor.  No cervical spine tenderness to palpation. Hematological/Lymphatic/Immunilogical: No cervical lymphadenopathy. Cardiovascular: Normal rate, regular rhythm. Normal S1 and S2.  Good peripheral circulation. Respiratory: Normal respiratory effort without tachypnea or retractions. Lungs with scattered wheezing. No rales or rhonchi. No absent or decreased breath  sounds. Gastrointestinal: Soft and nontender. No distention. No CVA tenderness. Musculoskeletal: No lower extremity tenderness nor edema.  No joint effusions. No visible deformity to rib cage. No paradoxical chest wall movement. No flail segments. Equal rise and fall of chest wall. Tenderness to palpation of the lateral right ribs. No specific point tenderness. No palpable abnormality. Neurologic:  Normal speech and language. No gross focal neurologic deficits are appreciated.  Skin:  Skin is warm, dry and intact. No rash noted. Psychiatric: Mood and affect are normal. Speech and behavior are normal. Patient exhibits appropriate insight and judgement.   ____________________________________________   LABS (all labs ordered are listed, but only abnormal results are displayed)  Labs Reviewed  CBC WITH DIFFERENTIAL/PLATELET - Abnormal; Notable for the following:    WBC 14.1 (*)    Hemoglobin 12.4 (*)    HCT 37.2 (*)    RDW 15.3 (*)    Neutro Abs 12.2 (*)  Lymphs Abs 0.6 (*)    Monocytes Absolute 1.3 (*)    All other components within normal limits  COMPREHENSIVE METABOLIC PANEL - Abnormal; Notable for the following:    Sodium 128 (*)    Chloride 96 (*)    Glucose, Bld 191 (*)    BUN 39 (*)    Creatinine, Ser 1.70 (*)    Calcium 8.3 (*)    Total Protein 6.2 (*)    Albumin 3.2 (*)    GFR calc non Af Amer 37 (*)    GFR calc Af Amer 43 (*)    All other components within normal limits  CULTURE, BLOOD (ROUTINE X 2)  CULTURE, BLOOD (ROUTINE X 2)  URINE CULTURE  URINALYSIS COMPLETEWITH MICROSCOPIC ([facility] ONLY)  LACTIC ACID, PLASMA  LACTIC ACID, PLASMA  PROTIME-INR  APTT  INFLUENZA PANEL BY PCR (TYPE A & B, H1N1)   ____________________________________________  EKG   ____________________________________________  RADIOLOGY Diamantina Providence Cuthriell, personally viewed and evaluated these images (plain radiographs) as part of my medical decision making, as well as reviewing the  written report by the radiologist.  Dg Chest 1 View  02/20/2015  CLINICAL DATA:  Mildly productive cough and wheezing for the past 2 days ; EXAM: CHEST 1 VIEW PA with one view lateral chest x-ray. The images are duplicated in PACs. The PA was performed at the [facility] outpatient facility but the patient then faint it and was transported to a [facility] where the lateral was performed. COMPARISON:  PA and lateral chest x-ray of November 05, 2014 FINDINGS: The lungs are adequately inflated. There is mildly increased density in the left lower lobe posteriorly. There is no pleural effusion. The heart is top-normal in size. The pulmonary vascularity is normal. The 7 sternal wires are intact. 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 There is calcification in the wall of the thoracic aorta. The bony thorax exhibits no acute abnormality. IMPRESSION: Subsegmental atelectasis or early pneumonia in the left lower lobe. There is no evidence of pulmonary edema. Followup PA and lateral chest X-ray is recommended in 3-4 weeks following trial of antibiotic therapy to ensure resolution and exclude underlying malignancy. Electronically Signed   By: David  Martinique M.D.   On: 02/20/2015 12:05   Dg Chest 1 View  02/20/2015  CLINICAL DATA:  Mildly productive cough and wheezing for the past 2 days ; EXAM: CHEST 1 VIEW PA with one view lateral chest x-ray. The images are duplicated in PACs. The PA was performed at the [facility] outpatient facility but the patient then faint it and was transported to a [facility] where the lateral was performed. COMPARISON:  PA and lateral chest x-ray of November 05, 2014 FINDINGS: The lungs are adequately inflated. There is mildly increased density in the left lower lobe posteriorly. There is no pleural effusion. The heart is top-normal in size. The pulmonary vascularity is normal. The 7 sternal wires are intact. 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 There is calcification in the wall of the thoracic  aorta. The bony thorax exhibits no acute abnormality. IMPRESSION: Subsegmental atelectasis or early pneumonia in the left lower lobe. There is no evidence of pulmonary edema. Followup PA and lateral chest X-ray is recommended in 3-4 weeks following trial of antibiotic therapy to ensure resolution and exclude underlying malignancy. Electronically Signed   By: David  Martinique M.D.   On: 02/20/2015 12:05   Dg Ribs Unilateral Right  02/20/2015  CLINICAL DATA:  Right-sided rib pain following fall, initial  encounter EXAM: RIGHT RIBS - 2 VIEW COMPARISON:  None. FINDINGS: Mildly displaced fractures of the eighth, ninth and tenth ribs are noted on the right posteriorly. No other fractures are seen. No underlying pneumothorax is noted. IMPRESSION: Multiple rib fractures posteriorly on the right consistent with the recent injury. No pneumothorax noted. Electronically Signed   By: Inez Catalina M.D.   On: 02/20/2015 12:32    ____________________________________________    PROCEDURES  Procedure(s) performed:       Medications  acetaminophen (TYLENOL) tablet 650 mg (not administered)  ondansetron (ZOFRAN) injection 4 mg (4 mg Intravenous Given 02/20/15 1451)  morphine 2 MG/ML injection 2 mg (2 mg Intravenous Given 02/20/15 1452)  levofloxacin (LEVAQUIN) IVPB 500 mg (0 mg Intravenous Stopped 02/20/15 1610)  sodium chloride 0.9 % bolus 500 mL (500 mLs Intravenous New Bag/Given 02/20/15 1525)     ____________________________________________   INITIAL IMPRESSION / ASSESSMENT AND PLAN / ED COURSE  Pertinent labs & imaging results that were available during my care of the patient were reviewed by me and considered in my medical decision making (see chart for details).   ----------------------------------------- 4:01 PM on 02/20/2015 -----------------------------------------  Patient initially presented to the emergency department for evaluation for possible rib fractures. Patient was at the outpatient  imaging facility when he fell off the stool while getting a chest x-ray. Patient landed on his right ribs but did not hit his head or lose consciousness. Initially patient was in no apparent distress, mild wheezing noted in left lower lobe, complaining of rib pain. Patient was alert but not oriented to time or place. Rib x-rays returned revealing 3 fractures to the posterior right ribs. Upon discussion with patient's in-home health care aide, she states that she would be unable to give patient antibiotics, pain medications, and use incentive spirometry at home. At this time patient care was discussed with Dr. Kerman Passey and it was determined that patient will be given pain medication, and laboratory testing would be undertaken. Patient responded well to IV pain medication as far as pain is concerned however patient now has a temperature, is drowsy, and in mild distress. At this time only laboratory results back are x-ray as well as CBC. Patient does have leukocytosis. Repeat vitals reveal an increasing temperature to 101.64F. Patient has an indwelling urinary catheter and upon emptying contents urine is dark, frothy, with foul odor. Urinalysis is added for evaluation.   ----------------------------------------- 4:23 PM on 02/20/2015 -----------------------------------------  Patient's clinical presentation continues to deteriorate. Patient now has rubs to auscultation and left lower lobe in addition to previously heard wheezing. He is drowsy, with increasing temperature, and appears to be in mild to moderate distress. No use of the accessory muscles with breathing. At this time it is felt the patient would be better suited to be evaluated in the major side of the emergency department with closer monitoring. The patient's case is discussed with Dr. Burlene Arnt. Due to the fact that the patient now is mildly somnolent patient will also receive a head CT and further coag labs to rule out any intracranial  abnormality. Dr. Burlene Arnt is accepting patient.. Is felt that this patient would be best maintained as an inpatient and Dr. Burlene Arnt will pursue admission orders.      ____________________________________________  FINAL CLINICAL IMPRESSION(S) / ED DIAGNOSES  Final diagnoses:  Left lower lobe pneumonia  Rib fractures, right, closed, initial encounter      NEW MEDICATIONS STARTED DURING THIS VISIT:  New Prescriptions   No medications on  file        Darletta Moll, PA-C 02/20/15 [place], MD 02/20/15 [phone]

## 2015-02-20 NOTE — Progress Notes (Signed)
Patient's wife knows that she would like her husband to be full code. Order change in the chart.

## 2015-02-21 LAB — BASIC METABOLIC PANEL
Anion gap: 5 (ref 5–15)
BUN: 34 mg/dL — ABNORMAL HIGH (ref 6–20)
CALCIUM: 8.2 mg/dL — AB (ref 8.9–10.3)
CO2: 26 mmol/L (ref 22–32)
CREATININE: 1.57 mg/dL — AB (ref 0.61–1.24)
Chloride: 99 mmol/L — ABNORMAL LOW (ref 101–111)
GFR, EST AFRICAN AMERICAN: 48 mL/min — AB (ref >60)
GFR, EST NON AFRICAN AMERICAN: 41 mL/min — AB (ref >60)
Glucose, Bld: 153 mg/dL — ABNORMAL HIGH (ref 65–99)
Potassium: 4.1 mmol/L (ref 3.5–5.1)
SODIUM: 130 mmol/L — AB (ref 135–145)

## 2015-02-21 LAB — GLUCOSE, CAPILLARY
GLUCOSE-CAPILLARY: 122 mg/dL — AB (ref 65–99)
GLUCOSE-CAPILLARY: 187 mg/dL — AB (ref 65–99)
Glucose-Capillary: 211 mg/dL — ABNORMAL HIGH (ref 65–99)
Glucose-Capillary: 201 mg/dL — ABNORMAL HIGH (ref 65–99)

## 2015-02-21 LAB — CBC
HCT: 31.2 % — ABNORMAL LOW (ref 40.0–52.0)
Hemoglobin: 10.6 g/dL — ABNORMAL LOW (ref 13.0–18.0)
MCH: 28.6 pg (ref 26.0–34.0)
MCHC: 34.1 g/dL (ref 32.0–36.0)
MCV: 83.8 fL (ref 80.0–100.0)
PLATELETS: 162 10*3/uL (ref 150–440)
RBC: 3.72 MIL/uL — AB (ref 4.40–5.90)
RDW: 15.5 % — ABNORMAL HIGH (ref 11.5–14.5)
WBC: 9.8 10*3/uL (ref 3.8–10.6)

## 2015-02-21 MED ORDER — SODIUM CHLORIDE 0.9 % IV SOLN
1.0000 g | Freq: Two times a day (BID) | INTRAVENOUS | Status: DC
  Administered 2015-02-21 – 2015-02-23 (×4): 1 g via INTRAVENOUS
  Filled 2015-02-21 (×6): qty 1

## 2015-02-21 MED ORDER — SODIUM CHLORIDE 0.9 % IV SOLN
1250.0000 mg | INTRAVENOUS | Status: DC
  Filled 2015-02-21: qty 1250

## 2015-02-21 MED ORDER — VANCOMYCIN HCL 10 G IV SOLR
1500.0000 mg | Freq: Once | INTRAVENOUS | Status: DC
  Filled 2015-02-21: qty 1500

## 2015-02-21 MED ORDER — IPRATROPIUM-ALBUTEROL 0.5-2.5 (3) MG/3ML IN SOLN
3.0000 mL | Freq: Four times a day (QID) | RESPIRATORY_TRACT | Status: DC
  Administered 2015-02-21 – 2015-02-24 (×6): 3 mL via RESPIRATORY_TRACT
  Filled 2015-02-21 (×8): qty 3

## 2015-02-21 MED ORDER — NYSTATIN 100000 UNIT/GM EX POWD
Freq: Two times a day (BID) | CUTANEOUS | Status: DC
  Administered 2015-02-21 – 2015-03-02 (×19): via TOPICAL
  Filled 2015-02-21: qty 15

## 2015-02-21 MED ORDER — VANCOMYCIN HCL IN DEXTROSE 750-5 MG/150ML-% IV SOLN
750.0000 mg | Freq: Two times a day (BID) | INTRAVENOUS | Status: DC
  Administered 2015-02-21 – 2015-02-23 (×4): 750 mg via INTRAVENOUS
  Filled 2015-02-21 (×5): qty 150

## 2015-02-21 MED ORDER — VANCOMYCIN HCL IN DEXTROSE 1-5 GM/200ML-% IV SOLN
1000.0000 mg | Freq: Once | INTRAVENOUS | Status: AC
  Administered 2015-02-21: 1000 mg via INTRAVENOUS
  Filled 2015-02-21: qty 200

## 2015-02-21 NOTE — Progress Notes (Signed)
Temp still 100.4 after 1 dose of vancomycin and meropenum. Continued pain in right ribs . Received pain relief from norco. Lungs remain congested with rhonchi

## 2015-02-21 NOTE — Clinical Social Work Note (Signed)
Clinical Social Work Assessment  Patient Details  Name: Bobby Jacobs MRN: [id number] Date of Birth: [birth year]  Date of referral:  02/21/15               Reason for consult:   (from [place]                Permission sought to share information with:  Family Supports Permission granted to share information::     Name::        Agency::     Relationship::     Contact Information:     Housing/Transportation Living arrangements for the past 2 months:  Group Home [facility]) Source of Information:  Spouse, Medical Team Patient Interpreter Needed:  None Criminal Activity/Legal Involvement Pertinent to Current Situation/Hospitalization:    Significant Relationships:  Adult Children, Spouse, Community Support Lives with:  Facility Resident Do you feel safe going back to the place where you live?  No (per wife not in current state) Need for family participation in patient care:     Care giving concerns:  Wife if concerned that [facility] will not be able to care for patient, he will need SNF.   Social Worker assessment / plan:  Holiday representative ([place]) consult, patient is from [place].   Patient was alert, however on CPAP machine.  (Additional Information provided by wife Charise Killian [phone] at bedside).  CSW introduced self and explained role of CSW department/\.  Patient has lived at [facility] for about two years, wife if concerned with patient being able to return at discharge.  States he will need rehab.  Patient has never been to rehab.  Patient has dementia and uses a wheelchair at the [facility].    CSW will start FL2, unsure if patient will discharge back to [facility] or to SNF.  Provided wife with SNF list to review in the event he needs SNF  Employment status:  Disabled (Comment on whether or not currently receiving Disability) Insurance information:  Medicare PT Recommendations:  Not assessed at this time Information / Referral to community resources:    (pending at this time)  Patient/Family's Response to care:  Patient was appreciative of information provided by CSW. Patient/Family's Understanding of and Emotional Response to Diagnosis, Current Treatment, and Prognosis:  Patient wife understands that he is under continued medical work up at this time.  Disposition pending at this time, back to [facility] or to SNF based on recommendation of PT.  Emotional Assessment Appearance:  Appears stated age Attitude/Demeanor/Rapport:  Unable to Assess Affect (typically observed):  Calm, Unable to Assess Orientation:  Oriented to Self (per wife alert to self and her) Alcohol / Substance use:    Psych involvement (Current and /or in the community):  No (Comment)  Discharge Needs  Concerns to be addressed:  Care Coordination, Discharge Planning Concerns Readmission within the last 30 days:  No Current discharge risk:  Chronically ill, Dependent with Mobility, Cognitively Impaired Barriers to Discharge:  Continued Medical Work up   [employer], LCSW 02/21/2015, 4:53 PM

## 2015-02-21 NOTE — Progress Notes (Signed)
Pt with temp 101.1. Having diffiuclty taking deep breaths r/t  fx ribs on right and memory impairment. Lungs congested with rhonchi. Dr patel notified for neb tx  To facilitate opening air ways

## 2015-02-21 NOTE — Progress Notes (Signed)
[place] at [facility]                                                                                                                                                                                            Patient Demographics   Bobby Jacobs, is a 75 y.o. male, DOB - 1940/11/15, [id number]  Admit date - 02/20/2015   Admitting Physician Hillary Bow, MD  Outpatient Primary MD for the patient is [name]., PA   LOS - 1  Subjective: Patient was admitted with pneumonia, also noticed to have urinary tract infection. Also has rib fractures. Patient has dementia and unable to provide any history continues to have fever.     Review of Systems:   CONSTITUTIONAL: Positive fever   Vitals:   Filed Vitals:   02/20/15 2300 02/21/15 0431 02/21/15 0824 02/21/15 1110  BP:  144/57 135/73   Pulse:  69 85   Temp:  100.9 F (38.3 C) 101.1 F (38.4 C) 100 F (37.8 C)  TempSrc:  Axillary Axillary Axillary  Resp:  18 18   Height:      Weight:      SpO2: 95% 94% 94%     Wt Readings from Last 3 Encounters:  02/20/15 88.451 kg (195 lb)  02/12/15 86.183 kg (190 lb)  02/10/15 86.183 kg (190 lb)     Intake/Output Summary (Last 24 hours) at 02/21/15 1209 Last data filed at 02/21/15 1203  Gross per 24 hour  Intake   1490 ml  Output   2400 ml  Net   -910 ml    Physical Exam:   GENERAL: Chronically ill-appearing HEAD, EYES, EARS, NOSE AND THROAT: Atraumatic, normocephalic. . Pupils equal and reactive to light. Sclerae anicteric. No conjunctival injection. No oro-pharyngeal erythema.  NECK: Supple. There is no jugular venous distention. No bruits, no lymphadenopathy, no thyromegaly.  HEART: Regular rate and rhythm,. No murmurs, no rubs, no clicks.  LUNGS: Diminished breath rales rhonchi wheezing ABDOMEN: Soft, flat, nontender, nondistended. Has good bowel sounds. No hepatosplenomegaly appreciated.  EXTREMITIES: No evidence of any  cyanosis, clubbing, or peripheral edema.  +2 pedal and radial pulses bilaterally.  NEUROLOGIC: Able to move all extremities very weak SKIN: Moist and warm with no rashes appreciated.  Psych: Not anxious, depressed await but not oriented to place patient on time LN: No inguinal LN enlargement    Antibiotics   Anti-infectives    Start     Dose/Rate Route Frequency Ordered Stop   02/21/15 1400  meropenem (MERREM) 1 g in sodium chloride 0.9 % 100 mL IVPB  1 g 200 mL/hr over 30 Minutes Intravenous 3 times per day 02/21/15 1208     02/20/15 2200  piperacillin-tazobactam (ZOSYN) IVPB 3.375 g  Status:  Discontinued     3.375 g 12.5 mL/hr over 240 Minutes Intravenous 3 times per day 02/20/15 2024 02/21/15 1208   02/20/15 1515  levofloxacin (LEVAQUIN) IVPB 500 mg     500 mg 100 mL/hr over 60 Minutes Intravenous  Once 02/20/15 1511 02/20/15 1610      Medications   Scheduled Meds: . amLODipine  10 mg Oral Daily  . atorvastatin  80 mg Oral QHS  . calcium-vitamin D  1 tablet Oral BID  . [START ON 02/23/2015] carbamide peroxide  2 drop Both Ears Once per day on Mon Thu  . gabapentin  300 mg Oral TID  . insulin aspart  0-5 Units Subcutaneous QHS  . insulin aspart  0-9 Units Subcutaneous TID WC  . insulin glargine  43 Units Subcutaneous Daily  . ipratropium-albuterol  3 mL Nebulization Q6H  . losartan  100 mg Oral Daily  . meropenem (MERREM) IV  1 g Intravenous 3 times per day  . metoprolol  50 mg Oral BID  . multivitamin-lutein  1 capsule Oral BID  . nystatin   Topical BID  . polyethylene glycol  17 g Oral BID  . psyllium  1 packet Oral TID  . QUEtiapine  50 mg Oral QHS  . traZODone  150 mg Oral QHS  . vitamin B-12  1,000 mcg Oral Daily  . warfarin  7.5 mg Oral QPM  . Warfarin - Physician Dosing Inpatient   Does not apply q1800   Continuous Infusions: . sodium chloride 75 mL/hr at 02/20/15 2043   PRN Meds:.acetaminophen **OR** acetaminophen, alum & mag hydroxide-simeth,  bisacodyl, HYDROcodone-acetaminophen, HYDROmorphone (DILAUDID) injection, ondansetron **OR** ondansetron (ZOFRAN) IV, senna-docusate   Data Review:   Micro Results Recent Results (from the past 240 hour(s))  Culture, blood (routine x 2)     Status: None (Preliminary result)   Collection Time: 02/20/15  2:39 PM  Result Value Ref Range Status   Specimen Description BLOOD RIGHT HAND  Final   Special Requests BOTTLES DRAWN AEROBIC AND ANAEROBIC  1CC  Final   Culture NO GROWTH < 24 HOURS  Final   Report Status PENDING  Incomplete  Culture, blood (routine x 2)     Status: None (Preliminary result)   Collection Time: 02/20/15  2:39 PM  Result Value Ref Range Status   Specimen Description BLOOD LEFT ANTECUBITAL  Final   Special Requests BOTTLES DRAWN AEROBIC AND ANAEROBIC  1CC  Final   Culture NO GROWTH < 24 HOURS  Final   Report Status PENDING  Incomplete  Urine culture     Status: None (Preliminary result)   Collection Time: 02/20/15  4:00 PM  Result Value Ref Range Status   Specimen Description URINE, RANDOM  Final   Special Requests NONE  Final   Culture   Final    >=100,000 COLONIES/mL PROTEUS MIRABILIS SUSCEPTIBILITIES TO FOLLOW    Report Status PENDING  Incomplete  Rapid Influenza A&B Antigens ([place] only)     Status: None   Collection Time: 02/20/15  5:20 PM  Result Value Ref Range Status   Influenza A ([facility]) NEGATIVE  Final   Influenza B ([facility]) NEGATIVE  Final  MRSA PCR Screening     Status: None   Collection Time: 02/20/15  8:55 PM  Result Value Ref Range Status   MRSA by PCR  NEGATIVE NEGATIVE Final    Comment:        The GeneXpert MRSA Assay (FDA approved for NASAL specimens only), is one component of a comprehensive MRSA colonization surveillance program. It is not intended to diagnose MRSA infection nor to guide or monitor treatment for MRSA infections.     Radiology Reports Dg Chest 1 View  02/20/2015  CLINICAL DATA:  Mildly productive cough and wheezing  for the past 2 days ; EXAM: CHEST 1 VIEW PA with one view lateral chest x-ray. The images are duplicated in PACs. The PA was performed at the [facility] outpatient facility but the patient then faint it and was transported to a [facility] where the lateral was performed. COMPARISON:  PA and lateral chest x-ray of November 05, 2014 FINDINGS: The lungs are adequately inflated. There is mildly increased density in the left lower lobe posteriorly. There is no pleural effusion. The heart is top-normal in size. The pulmonary vascularity is normal. The 7 sternal wires are intact. 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 There is calcification in the wall of the thoracic aorta. The bony thorax exhibits no acute abnormality. IMPRESSION: Subsegmental atelectasis or early pneumonia in the left lower lobe. There is no evidence of pulmonary edema. Followup PA and lateral chest X-ray is recommended in 3-4 weeks following trial of antibiotic therapy to ensure resolution and exclude underlying malignancy. Electronically Signed   By: David  Martinique M.D.   On: 02/20/2015 12:05   Dg Chest 1 View  02/20/2015  CLINICAL DATA:  Mildly productive cough and wheezing for the past 2 days ; EXAM: CHEST 1 VIEW PA with one view lateral chest x-ray. The images are duplicated in PACs. The PA was performed at the [facility] outpatient facility but the patient then faint it and was transported to a [facility] where the lateral was performed. COMPARISON:  PA and lateral chest x-ray of November 05, 2014 FINDINGS: The lungs are adequately inflated. There is mildly increased density in the left lower lobe posteriorly. There is no pleural effusion. The heart is top-normal in size. The pulmonary vascularity is normal. The 7 sternal wires are intact. 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 There is calcification in the wall of the thoracic aorta. The bony thorax exhibits no acute abnormality. IMPRESSION: Subsegmental atelectasis or early pneumonia in the left  lower lobe. There is no evidence of pulmonary edema. Followup PA and lateral chest X-ray is recommended in 3-4 weeks following trial of antibiotic therapy to ensure resolution and exclude underlying malignancy. Electronically Signed   By: David  Martinique M.D.   On: 02/20/2015 12:05   Dg Ribs Unilateral Right  02/20/2015  CLINICAL DATA:  Right-sided rib pain following fall, initial encounter EXAM: RIGHT RIBS - 2 VIEW COMPARISON:  None. FINDINGS: Mildly displaced fractures of the eighth, ninth and tenth ribs are noted on the right posteriorly. No other fractures are seen. No underlying pneumothorax is noted. IMPRESSION: Multiple rib fractures posteriorly on the right consistent with the recent injury. No pneumothorax noted. Electronically Signed   By: Inez Catalina M.D.   On: 02/20/2015 12:32   Ct Head Wo Contrast  02/20/2015  CLINICAL DATA:  Fall today, on Coumadin.  History of stroke. EXAM: CT HEAD WITHOUT CONTRAST TECHNIQUE: Contiguous axial images were obtained from the base of the skull through the vertex without intravenous contrast. COMPARISON:  08/03/2013 FINDINGS: There is atrophy and chronic small vessel disease changes. Old right occipital infarct. No acute intracranial abnormality. Specifically, no hemorrhage, hydrocephalus,  mass lesion, acute infarction, or significant intracranial injury. No acute calvarial abnormality. Visualized paranasal sinuses and mastoids clear. Orbital soft tissues unremarkable. IMPRESSION: Old right occipital infarct. No acute intracranial abnormality. Atrophy, chronic microvascular disease. Electronically Signed   By: Rolm Baptise M.D.   On: 02/20/2015 16:46   Dg Chest Port 1 View  02/20/2015  CLINICAL DATA:  Status post fall today with multiple right rib fractures. Initial encounter. EXAM: PORTABLE CHEST 1 VIEW COMPARISON:  PA and lateral chest and dedicated plain films of the ribs earlier today. FINDINGS: There is no pneumothorax. The lungs are clear. Heart size is  enlarged. Right rib fractures are not as well seen on this study as on dedicated plain films of the ribs. IMPRESSION: Negative for pneumothorax. Right rib fractures are better demonstrated on plain films earlier today. Cardiomegaly. Electronically Signed   By: Inge Rise M.D.   On: 02/20/2015 17:47     CBC  Recent Labs Lab 02/20/15 1439 02/21/15 0353  WBC 14.1* 9.8  HGB 12.4* 10.6*  HCT 37.2* 31.2*  PLT 185 162  MCV 83.8 83.8  MCH 27.9 28.6  MCHC 33.3 34.1  RDW 15.3* 15.5*  LYMPHSABS 0.6*  --   MONOABS 1.3*  --   EOSABS 0.0  --   BASOSABS 0.0  --     Chemistries   Recent Labs Lab 02/20/15 1551 02/21/15 0353  NA 128* 130*  K 4.3 4.1  CL 96* 99*  CO2 24 26  GLUCOSE 191* 153*  BUN 39* 34*  CREATININE 1.70* 1.57*  CALCIUM 8.3* 8.2*  AST 23  --   ALT 19  --   ALKPHOS 76  --   BILITOT 1.1  --    ------------------------------------------------------------------------------------------------------------------ estimated creatinine clearance is 44.8 mL/min (by C-G formula based on Cr of 1.57). ------------------------------------------------------------------------------------------------------------------ No results for input(s): HGBA1C in the last 72 hours. ------------------------------------------------------------------------------------------------------------------ No results for input(s): CHOL, HDL, LDLCALC, TRIG, CHOLHDL, LDLDIRECT in the last 72 hours. ------------------------------------------------------------------------------------------------------------------ No results for input(s): TSH, T4TOTAL, T3FREE, THYROIDAB in the last 72 hours.  Invalid input(s): FREET3 ------------------------------------------------------------------------------------------------------------------ No results for input(s): VITAMINB12, FOLATE, FERRITIN, TIBC, IRON, RETICCTPCT in the last 72 hours.  Coagulation profile  Recent Labs Lab 02/20/15 1439  INR 3.00    No  results for input(s): DDIMER in the last 72 hours.  Cardiac Enzymes No results for input(s): CKMB, TROPONINI, MYOGLOBIN in the last 168 hours.  Invalid input(s): CK ------------------------------------------------------------------------------------------------------------------ Invalid input(s): POCBNP    Assessment & Plan   75 year male with dementia and atrial fibrillation on anticoagulation who initially presented for an outpatient chest x-ray for cough and subsequently fell due to weakness and now separated rib fracture.  1. Healthcare acquired pneumonia:   Patient also has a urinary tract infection with Proteus which was resistant to ampicillin last year. At this point I will change his treatment to meropenem and vancomycin is continued to have fevers. Follow up final urine cultures and blood cultures. Also concern for aspiration I will ask speech to evaluate the patient 2. Rib fracture: Incentive spirometry  3. Dementia: Observe patient for signs of delirium. continue Seroquel and trazodone  4. Diabetes: Patient will be placed on sliding scale insulin and ADA diet. Continue insulin glargine monitor blood sugar closely  5. Atrial fibrillaticontinue Coumadin pharmacy to follow INR  6. Essential hypertension: Continue metoprolol, Norvasc and losartan   Patient was DO NOT RESUSCITATE his wife changed him to full code I will ask palliative care team to see on  Monday  Code Status History    Date Active Date Inactive Code Status Order ID Comments User Context   02/20/2015  8:18 PM 02/20/2015  8:19 PM DNR [id number]  Bettey Costa, MD Inpatient   02/20/2015  7:55 PM 02/20/2015  8:18 PM Full Code [id number]  Bettey Costa, MD ED   12/12/2014  9:38 AM 12/13/2014  3:23 AM Full Code [id number]  Inez Catalina, MD HOV    Questions for Most Recent Historical Code Status (Order [id number])    Question Answer Comment   In the event of cardiac or respiratory ARREST Do not call a "code blue"    In the event  of cardiac or respiratory ARREST Do not perform Intubation, CPR, defibrillation or ACLS    In the event of cardiac or respiratory ARREST Use medication by any route, position, wound care, and other measures to relive pain and suffering. May use oxygen, suction and manual treatment of airway obstruction as needed for comfort.     Advance Directive Documentation        Most Recent Value   Type of Advance Directive  Healthcare Power of Attorney   Pre-existing out of facility DNR order (yellow form or pink MOST form)     "MOST" Form in Place?             Consults  none  DVT Prophylaxis  coumadin  Lab Results  Component Value Date   PLT 162 02/21/2015     Time Spent in minutes  71min   Dustin Flock M.D on 02/21/2015 at 12:09 PM  Between 7am to 6pm - Pager - [phone]  After 6pm go to [email or web address] - password [email or web address] [facility]   Office  [phone]

## 2015-02-21 NOTE — Progress Notes (Signed)
ANTIBIOTIC CONSULT NOTE - INITIAL  Pharmacy Consult for Meropenem  Indication: HCAP/UTI  No Known Allergies  Patient Measurements: Height: 5\' 10"  (177.8 cm) Weight: 195 lb (88.451 kg) IBW/kg (Calculated) : 73  Vital Signs: Temp: 100 F (37.8 C) (01/28 1110) Temp Source: Axillary (01/28 1110) BP: 135/73 mmHg (01/28 0824) Pulse Rate: 85 (01/28 0824) Intake/Output from previous day: 01/27 0701 - 01/28 0700 In: 646.3 [I.V.:546.3; IV Piggyback:100] Out: 2400 [Urine:2400] Intake/Output from this shift: Total I/O In: 843.8 [P.O.:240; I.V.:603.8] Out: -   Labs:  Recent Labs  02/20/15 1439 02/20/15 1551 02/21/15 0353  WBC 14.1*  --  9.8  HGB 12.4*  --  10.6*  PLT 185  --  162  CREATININE  --  1.70* 1.57*   Estimated Creatinine Clearance: 44.8 mL/min (by C-G formula based on Cr of 1.57). No results for input(s): VANCOTROUGH, VANCOPEAK, VANCORANDOM, GENTTROUGH, GENTPEAK, GENTRANDOM, TOBRATROUGH, TOBRAPEAK, TOBRARND, AMIKACINPEAK, AMIKACINTROU, AMIKACIN in the last 72 hours.   Microbiology: Recent Results (from the past 720 hour(s))  Culture, blood (routine x 2)     Status: None (Preliminary result)   Collection Time: 02/20/15  2:39 PM  Result Value Ref Range Status   Specimen Description BLOOD RIGHT HAND  Final   Special Requests BOTTLES DRAWN AEROBIC AND ANAEROBIC  1CC  Final   Culture NO GROWTH < 24 HOURS  Final   Report Status PENDING  Incomplete  Culture, blood (routine x 2)     Status: None (Preliminary result)   Collection Time: 02/20/15  2:39 PM  Result Value Ref Range Status   Specimen Description BLOOD LEFT ANTECUBITAL  Final   Special Requests BOTTLES DRAWN AEROBIC AND ANAEROBIC  1CC  Final   Culture NO GROWTH < 24 HOURS  Final   Report Status PENDING  Incomplete  Urine culture     Status: None (Preliminary result)   Collection Time: 02/20/15  4:00 PM  Result Value Ref Range Status   Specimen Description URINE, RANDOM  Final   Special Requests NONE  Final    Culture   Final    >=100,000 COLONIES/mL PROTEUS MIRABILIS SUSCEPTIBILITIES TO FOLLOW    Report Status PENDING  Incomplete  Rapid Influenza A&B Antigens ([place] only)     Status: None   Collection Time: 02/20/15  5:20 PM  Result Value Ref Range Status   Influenza A ([place]) NEGATIVE  Final   Influenza B ([facility]) NEGATIVE  Final  MRSA PCR Screening     Status: None   Collection Time: 02/20/15  8:55 PM  Result Value Ref Range Status   MRSA by PCR NEGATIVE NEGATIVE Final    Comment:        The GeneXpert MRSA Assay (FDA approved for NASAL specimens only), is one component of a comprehensive MRSA colonization surveillance program. It is not intended to diagnose MRSA infection nor to guide or monitor treatment for MRSA infections.     Medical History: Past Medical History  Diagnosis Date  . Hypertension   . Skin cancer   . Coronary artery disease   . Depression   . GERD (gastroesophageal reflux disease)   . Cardiovascular disease   . High cholesterol   . Pneumonia   . Fracture   . Obesity   . Sleep apnea   . CHF (congestive heart failure) ([place])   . Dementia   . Atrial fibrillation ([place])   . Urinary retention   . Urine frequency   . Incomplete bladder emptying   . Diabetes mellitus,  type 2 ([place])   . Stroke ([place])   . Peripheral vascular disease ([place])   . Aortic aneurysm ([place])   . Aortic valve replaced   . Hyperthyroidism     Medications:  Scheduled:  . amLODipine  10 mg Oral Daily  . atorvastatin  80 mg Oral QHS  . calcium-vitamin D  1 tablet Oral BID  . [START ON 02/23/2015] carbamide peroxide  2 drop Both Ears Once per day on Mon Thu  . gabapentin  300 mg Oral TID  . insulin aspart  0-5 Units Subcutaneous QHS  . insulin aspart  0-9 Units Subcutaneous TID WC  . insulin glargine  43 Units Subcutaneous Daily  . ipratropium-albuterol  3 mL Nebulization Q6H  . losartan  100 mg Oral Daily  . meropenem (MERREM) IV  1 g Intravenous Q12H  . metoprolol  50 mg Oral BID   . multivitamin-lutein  1 capsule Oral BID  . nystatin   Topical BID  . polyethylene glycol  17 g Oral BID  . psyllium  1 packet Oral TID  . QUEtiapine  50 mg Oral QHS  . traZODone  150 mg Oral QHS  . vancomycin  1,250 mg Intravenous Q18H  . vancomycin  1,500 mg Intravenous Once  . vitamin B-12  1,000 mcg Oral Daily  . warfarin  7.5 mg Oral QPM  . Warfarin - Physician Dosing Inpatient   Does not apply q1800   Infusions:  . sodium chloride 75 mL/hr at 02/20/15 2043   Assessment: 75 y/o M with HCAP and UTI. Urine culture growing Proteus. Patient had previous Proteus UTI that was resistant to ampicillin per MD note. MD changing abx from vancomycin and Zosyn to vancomycin and meropenem.   Plan:  Will adjust meropenem dosing to 1 g q 12 hours as appropriate for renal function. Will continue to follow renal function and culture results.   Ulice Dash D 02/21/2015,12:25 PM

## 2015-02-21 NOTE — Consult Note (Signed)
ANTIBIOTIC CONSULT NOTE - INITIAL  Pharmacy Consult for vancomycin Indication: sepsis  No Known Allergies  Patient Measurements: Height: 5\' 10"  (177.8 cm) Weight: 195 lb (88.451 kg) IBW/kg (Calculated) : 73 Adjusted Body Weight:   Vital Signs: Temp: 100 F (37.8 C) (01/28 1110) Temp Source: Axillary (01/28 1110) BP: 135/73 mmHg (01/28 0824) Pulse Rate: 85 (01/28 0824) Intake/Output from previous day: 01/27 0701 - 01/28 0700 In: 646.3 [I.V.:546.3; IV Piggyback:100] Out: 2400 [Urine:2400] Intake/Output from this shift: Total I/O In: 843.8 [P.O.:240; I.V.:603.8] Out: -   Labs:  Recent Labs  02/20/15 1439 02/20/15 1551 02/21/15 0353  WBC 14.1*  --  9.8  HGB 12.4*  --  10.6*  PLT 185  --  162  CREATININE  --  1.70* 1.57*   Estimated Creatinine Clearance: 44.8 mL/min (by C-G formula based on Cr of 1.57). No results for input(s): VANCOTROUGH, VANCOPEAK, VANCORANDOM, GENTTROUGH, GENTPEAK, GENTRANDOM, TOBRATROUGH, TOBRAPEAK, TOBRARND, AMIKACINPEAK, AMIKACINTROU, AMIKACIN in the last 72 hours.   Microbiology: Recent Results (from the past 720 hour(s))  Culture, blood (routine x 2)     Status: None (Preliminary result)   Collection Time: 02/20/15  2:39 PM  Result Value Ref Range Status   Specimen Description BLOOD RIGHT HAND  Final   Special Requests BOTTLES DRAWN AEROBIC AND ANAEROBIC  1CC  Final   Culture NO GROWTH < 24 HOURS  Final   Report Status PENDING  Incomplete  Culture, blood (routine x 2)     Status: None (Preliminary result)   Collection Time: 02/20/15  2:39 PM  Result Value Ref Range Status   Specimen Description BLOOD LEFT ANTECUBITAL  Final   Special Requests BOTTLES DRAWN AEROBIC AND ANAEROBIC  1CC  Final   Culture NO GROWTH < 24 HOURS  Final   Report Status PENDING  Incomplete  Urine culture     Status: None (Preliminary result)   Collection Time: 02/20/15  4:00 PM  Result Value Ref Range Status   Specimen Description URINE, RANDOM  Final   Special  Requests NONE  Final   Culture   Final    >=100,000 COLONIES/mL PROTEUS MIRABILIS SUSCEPTIBILITIES TO FOLLOW    Report Status PENDING  Incomplete  Rapid Influenza A&B Antigens ([place] only)     Status: None   Collection Time: 02/20/15  5:20 PM  Result Value Ref Range Status   Influenza A ([place]) NEGATIVE  Final   Influenza B ([facility]) NEGATIVE  Final  MRSA PCR Screening     Status: None   Collection Time: 02/20/15  8:55 PM  Result Value Ref Range Status   MRSA by PCR NEGATIVE NEGATIVE Final    Comment:        The GeneXpert MRSA Assay (FDA approved for NASAL specimens only), is one component of a comprehensive MRSA colonization surveillance program. It is not intended to diagnose MRSA infection nor to guide or monitor treatment for MRSA infections.     Medical History: Past Medical History  Diagnosis Date  . Hypertension   . Skin cancer   . Coronary artery disease   . Depression   . GERD (gastroesophageal reflux disease)   . Cardiovascular disease   . High cholesterol   . Pneumonia   . Fracture   . Obesity   . Sleep apnea   . CHF (congestive heart failure) ([place])   . Dementia   . Atrial fibrillation ([place])   . Urinary retention   . Urine frequency   . Incomplete bladder emptying   .  Diabetes mellitus, type 2 ([place])   . Stroke ([place])   . Peripheral vascular disease ([place])   . Aortic aneurysm ([place])   . Aortic valve replaced   . Hyperthyroidism     Medications:  Scheduled:  . amLODipine  10 mg Oral Daily  . atorvastatin  80 mg Oral QHS  . calcium-vitamin D  1 tablet Oral BID  . [START ON 02/23/2015] carbamide peroxide  2 drop Both Ears Once per day on Mon Thu  . gabapentin  300 mg Oral TID  . insulin aspart  0-5 Units Subcutaneous QHS  . insulin aspart  0-9 Units Subcutaneous TID WC  . insulin glargine  43 Units Subcutaneous Daily  . ipratropium-albuterol  3 mL Nebulization Q6H  . losartan  100 mg Oral Daily  . meropenem (MERREM) IV  1 g Intravenous Q12H  .  metoprolol  50 mg Oral BID  . multivitamin-lutein  1 capsule Oral BID  . nystatin   Topical BID  . polyethylene glycol  17 g Oral BID  . psyllium  1 packet Oral TID  . QUEtiapine  50 mg Oral QHS  . traZODone  150 mg Oral QHS  . vancomycin  1,000 mg Intravenous Once  . vancomycin  750 mg Intravenous Q12H  . vitamin B-12  1,000 mcg Oral Daily  . warfarin  7.5 mg Oral QPM  . Warfarin - Physician Dosing Inpatient   Does not apply q1800   Assessment: 75 y/o M with HCAP and UTI. Urine cx growing proteus which pt is receiving meropemem due to hx of ampicillin resistance. Pharmacy consulted to dose vancomycin for PNA.  Goal of Therapy:  Vancomycin trough level 15-20 mcg/ml  Plan:  Expected duration 7 days with resolution of temperature and/or normalization of WBC Measure antibiotic drug levels at steady state Follow up culture results vancomycin 1g once, then 750mg  q 12 hours starting 6 hours from inital dose. Will measure trough at steady state 1/31 @ 1900.  Pharmacy to continue to monitor. Thank you for the consult.  Telia Amundson D Cadel Stairs 02/21/2015,12:32 PM

## 2015-02-22 LAB — BASIC METABOLIC PANEL
ANION GAP: 8 (ref 5–15)
BUN: 31 mg/dL — ABNORMAL HIGH (ref 6–20)
CO2: 25 mmol/L (ref 22–32)
Calcium: 8.3 mg/dL — ABNORMAL LOW (ref 8.9–10.3)
Chloride: 102 mmol/L (ref 101–111)
Creatinine, Ser: 1.62 mg/dL — ABNORMAL HIGH (ref 0.61–1.24)
GFR calc Af Amer: 46 mL/min — ABNORMAL LOW (ref >60)
GFR calc non Af Amer: 40 mL/min — ABNORMAL LOW (ref >60)
GLUCOSE: 169 mg/dL — AB (ref 65–99)
POTASSIUM: 4 mmol/L (ref 3.5–5.1)
Sodium: 135 mmol/L (ref 135–145)

## 2015-02-22 LAB — CBC
HEMATOCRIT: 31.5 % — AB (ref 40.0–52.0)
Hemoglobin: 10.6 g/dL — ABNORMAL LOW (ref 13.0–18.0)
MCH: 28.7 pg (ref 26.0–34.0)
MCHC: 33.5 g/dL (ref 32.0–36.0)
MCV: 85.5 fL (ref 80.0–100.0)
Platelets: 173 10*3/uL (ref 150–440)
RBC: 3.69 MIL/uL — AB (ref 4.40–5.90)
RDW: 15.2 % — AB (ref 11.5–14.5)
WBC: 9.7 10*3/uL (ref 3.8–10.6)

## 2015-02-22 LAB — GLUCOSE, CAPILLARY
GLUCOSE-CAPILLARY: 215 mg/dL — AB (ref 65–99)
GLUCOSE-CAPILLARY: 169 mg/dL — AB (ref 65–99)
Glucose-Capillary: 138 mg/dL — ABNORMAL HIGH (ref 65–99)
Glucose-Capillary: 229 mg/dL — ABNORMAL HIGH (ref 65–99)

## 2015-02-22 LAB — URINE CULTURE: Culture: >=100000

## 2015-02-22 MED ORDER — IBUPROFEN 400 MG PO TABS
400.0000 mg | ORAL_TABLET | Freq: Four times a day (QID) | ORAL | Status: DC | PRN
  Administered 2015-02-22 – 2015-02-26 (×2): 400 mg via ORAL
  Filled 2015-02-22 (×2): qty 1

## 2015-02-22 NOTE — Progress Notes (Signed)
[place] at [facility]                                                                                                                                                                                            Patient Demographics   Bobby Jacobs, is a 75 y.o. male, DOB - 11-05-1940, [id number]  Admit date - 02/20/2015   Admitting Physician Hillary Bow, MD  Outpatient Primary MD for the patient is [name]., PA   LOS - 2  Subjective: Patient currently sleeping. His temperature is trending down now. Wife at bedside   Review of Systems:   CONSTITUTIONAL: Positive fever   Vitals:   Filed Vitals:   02/22/15 0658 02/22/15 0747 02/22/15 0804 02/22/15 1105  BP:  154/70    Pulse:  100    Temp: 99.5 F (37.5 C) 101 F (38.3 C)  99.9 F (37.7 C)  TempSrc: Oral Axillary  Axillary  Resp:  18    Height:      Weight:      SpO2:  94% 94%     Wt Readings from Last 3 Encounters:  02/20/15 88.451 kg (195 lb)  02/12/15 86.183 kg (190 lb)  02/10/15 86.183 kg (190 lb)     Intake/Output Summary (Last 24 hours) at 02/22/15 1215 Last data filed at 02/22/15 0910  Gross per 24 hour  Intake 1666.25 ml  Output   2975 ml  Net -1308.75 ml    Physical Exam:   GENERAL: Chronically ill-appearing HEAD, EYES, EARS, NOSE AND THROAT: Atraumatic, normocephalic. . Pupils equal and reactive to light. Sclerae anicteric. No conjunctival injection. No oro-pharyngeal erythema.  NECK: Supple. There is no jugular venous distention. No bruits, no lymphadenopathy, no thyromegaly.  HEART: Regular rate and rhythm,. No murmurs, no rubs, no clicks.  LUNGS: Diminished breath rales rhonchi wheezing ABDOMEN: Soft, flat, nontender, nondistended. Has good bowel sounds. No hepatosplenomegaly appreciated.  EXTREMITIES: No evidence of any cyanosis, clubbing, or peripheral edema.  +2 pedal and radial pulses bilaterally.  NEUROLOGIC: Sleeping SKIN: Moist and warm  with no rashes appreciated.  Psych: Sleeping  LN: No inguinal LN enlargement    Antibiotics   Anti-infectives    Start     Dose/Rate Route Frequency Ordered Stop   02/21/15 2130  vancomycin (VANCOCIN) 1,250 mg in sodium chloride 0.9 % 250 mL IVPB  Status:  Discontinued     1,250 mg 166.7 mL/hr over 90 Minutes Intravenous Every 18 hours 02/21/15 1224 02/21/15 1239   02/21/15 1930  vancomycin (VANCOCIN) IVPB 750 mg/150 ml premix     750 mg 150 mL/hr  over 60 Minutes Intravenous Every 12 hours 02/21/15 1239     02/21/15 1300  meropenem (MERREM) 1 g in sodium chloride 0.9 % 100 mL IVPB     1 g 200 mL/hr over 30 Minutes Intravenous Every 12 hours 02/21/15 1208     02/21/15 1245  vancomycin (VANCOCIN) IVPB 1000 mg/200 mL premix     1,000 mg 200 mL/hr over 60 Minutes Intravenous  Once 02/21/15 1239 02/21/15 1443   02/21/15 1230  vancomycin (VANCOCIN) 1,500 mg in sodium chloride 0.9 % 500 mL IVPB  Status:  Discontinued     1,500 mg 250 mL/hr over 120 Minutes Intravenous  Once 02/21/15 1224 02/21/15 1239   02/20/15 2200  piperacillin-tazobactam (ZOSYN) IVPB 3.375 g  Status:  Discontinued     3.375 g 12.5 mL/hr over 240 Minutes Intravenous 3 times per day 02/20/15 2024 02/21/15 1208   02/20/15 1515  levofloxacin (LEVAQUIN) IVPB 500 mg     500 mg 100 mL/hr over 60 Minutes Intravenous  Once 02/20/15 1511 02/20/15 1610      Medications   Scheduled Meds: . amLODipine  10 mg Oral Daily  . atorvastatin  80 mg Oral QHS  . calcium-vitamin D  1 tablet Oral BID  . [START ON 02/23/2015] carbamide peroxide  2 drop Both Ears Once per day on Mon Thu  . gabapentin  300 mg Oral TID  . insulin aspart  0-5 Units Subcutaneous QHS  . insulin aspart  0-9 Units Subcutaneous TID WC  . insulin glargine  43 Units Subcutaneous Daily  . ipratropium-albuterol  3 mL Nebulization Q6H  . losartan  100 mg Oral Daily  . meropenem (MERREM) IV  1 g Intravenous Q12H  . metoprolol  50 mg Oral BID  .  multivitamin-lutein  1 capsule Oral BID  . nystatin   Topical BID  . polyethylene glycol  17 g Oral BID  . psyllium  1 packet Oral TID  . QUEtiapine  50 mg Oral QHS  . traZODone  150 mg Oral QHS  . vancomycin  750 mg Intravenous Q12H  . vitamin B-12  1,000 mcg Oral Daily  . warfarin  7.5 mg Oral QPM  . Warfarin - Physician Dosing Inpatient   Does not apply q1800   Continuous Infusions: . sodium chloride 75 mL/hr at 02/22/15 0659   PRN Meds:.acetaminophen **OR** acetaminophen, alum & mag hydroxide-simeth, bisacodyl, HYDROcodone-acetaminophen, HYDROmorphone (DILAUDID) injection, ibuprofen, ondansetron **OR** ondansetron (ZOFRAN) IV, senna-docusate   Data Review:   Micro Results Recent Results (from the past 240 hour(s))  Culture, blood (routine x 2)     Status: None (Preliminary result)   Collection Time: 02/20/15  2:39 PM  Result Value Ref Range Status   Specimen Description BLOOD RIGHT HAND  Final   Special Requests BOTTLES DRAWN AEROBIC AND ANAEROBIC  1CC  Final   Culture NO GROWTH 2 DAYS  Final   Report Status PENDING  Incomplete  Culture, blood (routine x 2)     Status: None (Preliminary result)   Collection Time: 02/20/15  2:39 PM  Result Value Ref Range Status   Specimen Description BLOOD LEFT ANTECUBITAL  Final   Special Requests BOTTLES DRAWN AEROBIC AND ANAEROBIC  1CC  Final   Culture NO GROWTH 2 DAYS  Final   Report Status PENDING  Incomplete  Urine culture     Status: None (Preliminary result)   Collection Time: 02/20/15  4:00 PM  Result Value Ref Range Status   Specimen Description URINE, RANDOM  Final   Special Requests NONE  Final   Culture   Final    >=100,000 COLONIES/mL PROTEUS MIRABILIS SUSCEPTIBILITIES TO FOLLOW    Report Status PENDING  Incomplete  Rapid Influenza A&B Antigens ([place] only)     Status: None   Collection Time: 02/20/15  5:20 PM  Result Value Ref Range Status   Influenza A ([place]) NEGATIVE  Final   Influenza B ([facility]) NEGATIVE  Final   MRSA PCR Screening     Status: None   Collection Time: 02/20/15  8:55 PM  Result Value Ref Range Status   MRSA by PCR NEGATIVE NEGATIVE Final    Comment:        The GeneXpert MRSA Assay (FDA approved for NASAL specimens only), is one component of a comprehensive MRSA colonization surveillance program. It is not intended to diagnose MRSA infection nor to guide or monitor treatment for MRSA infections.     Radiology Reports Dg Chest 1 View  02/20/2015  CLINICAL DATA:  Mildly productive cough and wheezing for the past 2 days ; EXAM: CHEST 1 VIEW PA with one view lateral chest x-ray. The images are duplicated in PACs. The PA was performed at the [facility] outpatient facility but the patient then faint it and was transported to a [facility] where the lateral was performed. COMPARISON:  PA and lateral chest x-ray of November 05, 2014 FINDINGS: The lungs are adequately inflated. There is mildly increased density in the left lower lobe posteriorly. There is no pleural effusion. The heart is top-normal in size. The pulmonary vascularity is normal. The 7 sternal wires are intact. 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 There is calcification in the wall of the thoracic aorta. The bony thorax exhibits no acute abnormality. IMPRESSION: Subsegmental atelectasis or early pneumonia in the left lower lobe. There is no evidence of pulmonary edema. Followup PA and lateral chest X-ray is recommended in 3-4 weeks following trial of antibiotic therapy to ensure resolution and exclude underlying malignancy. Electronically Signed   By: David  Martinique M.D.   On: 02/20/2015 12:05   Dg Chest 1 View  02/20/2015  CLINICAL DATA:  Mildly productive cough and wheezing for the past 2 days ; EXAM: CHEST 1 VIEW PA with one view lateral chest x-ray. The images are duplicated in PACs. The PA was performed at the [facility] outpatient facility but the patient then faint it and was transported to a [facility] where the lateral was  performed. COMPARISON:  PA and lateral chest x-ray of November 05, 2014 FINDINGS: The lungs are adequately inflated. There is mildly increased density in the left lower lobe posteriorly. There is no pleural effusion. The heart is top-normal in size. The pulmonary vascularity is normal. The 7 sternal wires are intact. 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 There is calcification in the wall of the thoracic aorta. The bony thorax exhibits no acute abnormality. IMPRESSION: Subsegmental atelectasis or early pneumonia in the left lower lobe. There is no evidence of pulmonary edema. Followup PA and lateral chest X-ray is recommended in 3-4 weeks following trial of antibiotic therapy to ensure resolution and exclude underlying malignancy. Electronically Signed   By: David  Martinique M.D.   On: 02/20/2015 12:05   Dg Ribs Unilateral Right  02/20/2015  CLINICAL DATA:  Right-sided rib pain following fall, initial encounter EXAM: RIGHT RIBS - 2 VIEW COMPARISON:  None. FINDINGS: Mildly displaced fractures of the eighth, ninth and tenth ribs are noted on the right posteriorly. No other fractures are  seen. No underlying pneumothorax is noted. IMPRESSION: Multiple rib fractures posteriorly on the right consistent with the recent injury. No pneumothorax noted. Electronically Signed   By: Inez Catalina M.D.   On: 02/20/2015 12:32   Ct Head Wo Contrast  02/20/2015  CLINICAL DATA:  Fall today, on Coumadin.  History of stroke. EXAM: CT HEAD WITHOUT CONTRAST TECHNIQUE: Contiguous axial images were obtained from the base of the skull through the vertex without intravenous contrast. COMPARISON:  08/03/2013 FINDINGS: There is atrophy and chronic small vessel disease changes. Old right occipital infarct. No acute intracranial abnormality. Specifically, no hemorrhage, hydrocephalus, mass lesion, acute infarction, or significant intracranial injury. No acute calvarial abnormality. Visualized paranasal sinuses and mastoids clear. Orbital  soft tissues unremarkable. IMPRESSION: Old right occipital infarct. No acute intracranial abnormality. Atrophy, chronic microvascular disease. Electronically Signed   By: Rolm Baptise M.D.   On: 02/20/2015 16:46   Dg Chest Port 1 View  02/20/2015  CLINICAL DATA:  Status post fall today with multiple right rib fractures. Initial encounter. EXAM: PORTABLE CHEST 1 VIEW COMPARISON:  PA and lateral chest and dedicated plain films of the ribs earlier today. FINDINGS: There is no pneumothorax. The lungs are clear. Heart size is enlarged. Right rib fractures are not as well seen on this study as on dedicated plain films of the ribs. IMPRESSION: Negative for pneumothorax. Right rib fractures are better demonstrated on plain films earlier today. Cardiomegaly. Electronically Signed   By: Inge Rise M.D.   On: 02/20/2015 17:47     CBC  Recent Labs Lab 02/20/15 1439 02/21/15 0353 02/22/15 0335  WBC 14.1* 9.8 9.7  HGB 12.4* 10.6* 10.6*  HCT 37.2* 31.2* 31.5*  PLT 185 162 173  MCV 83.8 83.8 85.5  MCH 27.9 28.6 28.7  MCHC 33.3 34.1 33.5  RDW 15.3* 15.5* 15.2*  LYMPHSABS 0.6*  --   --   MONOABS 1.3*  --   --   EOSABS 0.0  --   --   BASOSABS 0.0  --   --     Chemistries   Recent Labs Lab 02/20/15 1551 02/21/15 0353 02/22/15 0335  NA 128* 130* 135  K 4.3 4.1 4.0  CL 96* 99* 102  CO2 24 26 25   GLUCOSE 191* 153* 169*  BUN 39* 34* 31*  CREATININE 1.70* 1.57* 1.62*  CALCIUM 8.3* 8.2* 8.3*  AST 23  --   --   ALT 19  --   --   ALKPHOS 76  --   --   BILITOT 1.1  --   --    ------------------------------------------------------------------------------------------------------------------ estimated creatinine clearance is 43.5 mL/min (by C-G formula based on Cr of 1.62). ------------------------------------------------------------------------------------------------------------------ No results for input(s): HGBA1C in the last 72  hours. ------------------------------------------------------------------------------------------------------------------ No results for input(s): CHOL, HDL, LDLCALC, TRIG, CHOLHDL, LDLDIRECT in the last 72 hours. ------------------------------------------------------------------------------------------------------------------ No results for input(s): TSH, T4TOTAL, T3FREE, THYROIDAB in the last 72 hours.  Invalid input(s): FREET3 ------------------------------------------------------------------------------------------------------------------ No results for input(s): VITAMINB12, FOLATE, FERRITIN, TIBC, IRON, RETICCTPCT in the last 72 hours.  Coagulation profile  Recent Labs Lab 02/20/15 1439  INR 3.00    No results for input(s): DDIMER in the last 72 hours.  Cardiac Enzymes No results for input(s): CKMB, TROPONINI, MYOGLOBIN in the last 168 hours.  Invalid input(s): CK ------------------------------------------------------------------------------------------------------------------ Invalid input(s): POCBNP    Assessment & Plan   75 year male with dementia and atrial fibrillation on anticoagulation who initially presented for an outpatient chest x-ray for cough and subsequently fell  due to weakness and now separated rib fracture.  1. Healthcare acquired pneumonia:   Patient also has a urinary tract infection with Proteus  Continue IV antibiotics with vancomycin and meropenem His temperature trending down 2. Rib fracture: Incentive spirometry if patient able to do 3. Dementia: Observe patient for signs of delirium. continue Seroquel and trazodone  4. Diabetes: Patient will be placed on sliding scale insulin and ADA diet. Continue insulin glargine monitor blood sugar closely  5. Atrial fibrillation -Coumadin pharmacy to follow INR  6. Essential hypertension: Continue metoprolol, Norvasc and losartan   Patient was DO NOT RESUSCITATE his wife changed him to full code I will ask  palliative care team to see on Monday  Code Status History    Date Active Date Inactive Code Status Order ID Comments User Context   02/20/2015  8:18 PM 02/20/2015  8:19 PM DNR [id number]  Bettey Costa, MD Inpatient   02/20/2015  7:55 PM 02/20/2015  8:18 PM Full Code [id number]  Bettey Costa, MD ED   12/12/2014  9:38 AM 12/13/2014  3:23 AM Full Code [id number]  Inez Catalina, MD HOV    Questions for Most Recent Historical Code Status (Order [id number])    Question Answer Comment   In the event of cardiac or respiratory ARREST Do not call a "code blue"    In the event of cardiac or respiratory ARREST Do not perform Intubation, CPR, defibrillation or ACLS    In the event of cardiac or respiratory ARREST Use medication by any route, position, wound care, and other measures to relive pain and suffering. May use oxygen, suction and manual treatment of airway obstruction as needed for comfort.     Advance Directive Documentation        Most Recent Value   Type of Advance Directive  Healthcare Power of Attorney   Pre-existing out of facility DNR order (yellow form or pink MOST form)     "MOST" Form in Place?             Consults  none  DVT Prophylaxis  coumadin  Lab Results  Component Value Date   PLT 173 02/22/2015     Time Spent in minutes  41min   Aizah Gehlhausen M.D on 02/22/2015 at 12:15 PM  Between 7am to 6pm - Pager - [phone]  After 6pm go to [email or web address] - password [email or web address] [place] [facility]   Office  [phone]

## 2015-02-23 LAB — BASIC METABOLIC PANEL
Anion gap: 6 (ref 5–15)
BUN: 28 mg/dL — AB (ref 6–20)
CHLORIDE: 105 mmol/L (ref 101–111)
CO2: 27 mmol/L (ref 22–32)
Calcium: 8.2 mg/dL — ABNORMAL LOW (ref 8.9–10.3)
Creatinine, Ser: 1.22 mg/dL (ref 0.61–1.24)
GFR calc Af Amer: >60 mL/min (ref >60)
GFR calc non Af Amer: 56 mL/min — ABNORMAL LOW (ref >60)
GLUCOSE: 127 mg/dL — AB (ref 65–99)
POTASSIUM: 4.1 mmol/L (ref 3.5–5.1)
Sodium: 138 mmol/L (ref 135–145)

## 2015-02-23 LAB — CBC
HCT: 32 % — ABNORMAL LOW (ref 40.0–52.0)
HEMOGLOBIN: 10.8 g/dL — AB (ref 13.0–18.0)
MCH: 28.4 pg (ref 26.0–34.0)
MCHC: 33.7 g/dL (ref 32.0–36.0)
MCV: 84.4 fL (ref 80.0–100.0)
PLATELETS: 193 10*3/uL (ref 150–440)
RBC: 3.79 MIL/uL — AB (ref 4.40–5.90)
RDW: 15.3 % — ABNORMAL HIGH (ref 11.5–14.5)
WBC: 10.3 10*3/uL (ref 3.8–10.6)

## 2015-02-23 LAB — GLUCOSE, CAPILLARY
GLUCOSE-CAPILLARY: 249 mg/dL — AB (ref 65–99)
GLUCOSE-CAPILLARY: 216 mg/dL — AB (ref 65–99)
Glucose-Capillary: 147 mg/dL — ABNORMAL HIGH (ref 65–99)
Glucose-Capillary: 195 mg/dL — ABNORMAL HIGH (ref 65–99)

## 2015-02-23 LAB — PROTIME-INR
INR: 5.59
Prothrombin Time: 48.9 seconds — ABNORMAL HIGH (ref 11.4–15.0)

## 2015-02-23 MED ORDER — WARFARIN - PHARMACIST DOSING INPATIENT: Freq: Every day | Status: DC

## 2015-02-23 MED ORDER — CLINDAMYCIN PHOSPHATE 300 MG/50ML IV SOLN
300.0000 mg | Freq: Three times a day (TID) | INTRAVENOUS | Status: DC
  Administered 2015-02-23 – 2015-02-24 (×3): 300 mg via INTRAVENOUS
  Filled 2015-02-23 (×5): qty 50

## 2015-02-23 MED ORDER — DEXTROSE 5 % IV SOLN
1.0000 g | INTRAVENOUS | Status: DC
  Administered 2015-02-23 – 2015-03-01 (×7): 1 g via INTRAVENOUS
  Filled 2015-02-23 (×9): qty 10

## 2015-02-23 NOTE — Clinical Documentation Improvement (Signed)
Internal Medicine  Please update your documentation within the medical record to reflect your response to this query. Thank you  Can the diagnosis of CHF be further specified?    Acuity - Acute, Chronic, Acute on Chronic   Type - Systolic, Diastolic, Systolic and Diastolic  Other  Clinically Undetermined  Document any associated diagnoses/conditions  Supporting Information: 02/20/15 noted per H&P..."history of:.Marland KitchenMarland KitchenCHF (congestive heart failure)"... Last Echo 07/22/14 "- Left ventricle: The cavity size was normal. Systolic function was mildly reduced. The estimated ejection fraction was in the rangeof 45% to 50%. Septal motion consistent with postoperative state.Unable to exclude inferior wall hypokinesis. Left ventricular diastolic function parameters were normal.".Marland KitchenMarland Kitchen No recent BNP noted  Please exercise your independent, professional judgment when responding. A specific answer is not anticipated or expected.  Thank You,  Ermelinda Das, RN, BSN, [place] Certified Clinical Documentation Specialist [place]: Health Information Management [phone]

## 2015-02-23 NOTE — Progress Notes (Signed)
INR elevated.  MD notified

## 2015-02-23 NOTE — Care Management Important Message (Signed)
Important Message  Patient Details  Name: Bobby Jacobs MRN: [id number] Date of Birth: 1940-02-03   Medicare Important Message Given:  Yes    Juliann Pulse A Nova Schmuhl 02/23/2015, 2:09 PM

## 2015-02-23 NOTE — Progress Notes (Signed)
MD paged for critical value inr of 5.59 awaiting returned page

## 2015-02-23 NOTE — Progress Notes (Signed)
Inpatient Diabetes Program Recommendations  AACE/ADA: New Consensus Statement on Inpatient Glycemic Control (2015)  Target Ranges:  Prepandial:   less than 140 mg/dL      Peak postprandial:   less than 180 mg/dL (1-2 hours)      Critically ill patients:  140 - 180 mg/dL  Results for MANSOUR, LOGGINS (MRN [id number]) as of 02/23/2015 12:41  Ref. Range 02/22/2015 07:48 02/22/2015 11:04 02/22/2015 16:02 02/22/2015 21:48 02/23/2015 07:58 02/23/2015 11:19  Glucose-Capillary Latest Ref Range: 65-99 mg/dL 138 (H) 215 (H) 229 (H) 169 (H) 147 (H) 195 (H)   Review of Glycemic Control  Diabetes history: DM2 Outpatient Diabetes medications: Toujeo 43 units daily, Humalog 5 units TID with meals Current orders for Inpatient glycemic control: Lantus 43 units daily, Novolog 0-9 units TID with meals, Novolog 0-5 units HS  Inpatient Diabetes Program Recommendations: Insulin - Meal Coverage: If patient eats at least 50% of meal, please consider ordering Novolog 3 units TID with meals for meal coverage (in addition to Novolog correction scale).  Thanks, Barnie Alderman, RN, MSN, CDE Diabetes Coordinator Inpatient Diabetes Program [phone] (Team Pager from [place] to [place]) [phone] ([facility] office) [phone] [facility] office) [phone] [facility] office)

## 2015-02-23 NOTE — Progress Notes (Signed)
Clinical Social Worker (Marcus) contacted Arlie Solomons [phone] family care home owner. Per Manuela Schwartz patient has been a resident at the [facility] since 08/2013. Per Manuela Schwartz patient's baseline is ambulating with a walker. PT needs to evaluate patient to determine if he is at baseline. Per Manuela Schwartz patient's address is [address] [place], [place]. Per Manuela Schwartz CSW can faxed D/C orders to [phone]. CSW will continue to follow and assist as needed.   Blima Rich, LCSW [phone]

## 2015-02-23 NOTE — Progress Notes (Signed)
[facility] [place]                                                                                                                                                                                            Patient Demographics   Bobby Jacobs, is a 75 y.o. male, DOB - [birth year], [id number]  Admit date - 02/20/2015   Admitting Physician Hillary Bow, MD  Outpatient Primary MD for the patient is [name]., PA   LOS - 3  Subjective:  Patient seems to be more alert. Due to his dementia and unable to provide any history his wife is at bedside feeding him.  His INR was noted to be elevated this morning  Review of Systems:   CONSTITUTIONAL: Fever resolved   Vitals:   Filed Vitals:   02/22/15 2018 02/23/15 0410 02/23/15 0803 02/23/15 0804  BP: 169/77 148/77 122/89   Pulse: 89 90 95   Temp: 98.8 F (37.1 C) 99.4 F (37.4 C)  99.3 F (37.4 C)  TempSrc: Oral Oral  Axillary  Resp: 18 20 17    Height:      Weight:      SpO2: 97% 95% 94%     Wt Readings from Last 3 Encounters:  02/20/15 88.451 kg (195 lb)  02/12/15 86.183 kg (190 lb)  02/10/15 86.183 kg (190 lb)     Intake/Output Summary (Last 24 hours) at 02/23/15 1351 Last data filed at 02/23/15 1049  Gross per 24 hour  Intake    930 ml  Output   1525 ml  Net   -595 ml    Physical Exam:   GENERAL: Chronically ill-appearing HEAD, EYES, EARS, NOSE AND THROAT: Atraumatic, normocephalic. . Pupils equal and reactive to light. Sclerae anicteric. No conjunctival injection. No oro-pharyngeal erythema.  NECK: Supple. There is no jugular venous distention. No bruits, no lymphadenopathy, no thyromegaly.  HEART: Regular rate and rhythm,. No murmurs, no rubs, no clicks.  LUNGS: Diminished breath rales rhonchi wheezing ABDOMEN: Soft, flat, nontender, nondistended. Has good bowel sounds. No hepatosplenomegaly appreciated.  EXTREMITIES: No evidence of any cyanosis, clubbing, or  peripheral edema.  +2 pedal and radial pulses bilaterally.  NEUROLOGIC: Limited exam due to patient unable to follow commands SKIN: Moist and warm with no rashes appreciated.  Psych: Anxious to depressed LN: No inguinal LN enlargement    Antibiotics   Anti-infectives    Start     Dose/Rate Route Frequency Ordered Stop   02/23/15 1400  clindamycin (CLEOCIN) IVPB 300 mg     300 mg 100 mL/hr over 30 Minutes Intravenous 3 times per  day 02/23/15 1233     02/23/15 1300  cefTRIAXone (ROCEPHIN) 1 g in dextrose 5 % 50 mL IVPB     1 g 100 mL/hr over 30 Minutes Intravenous Every 24 hours 02/23/15 1233     02/21/15 2130  vancomycin (VANCOCIN) 1,250 mg in sodium chloride 0.9 % 250 mL IVPB  Status:  Discontinued     1,250 mg 166.7 mL/hr over 90 Minutes Intravenous Every 18 hours 02/21/15 1224 02/21/15 1239   02/21/15 1930  vancomycin (VANCOCIN) IVPB 750 mg/150 ml premix  Status:  Discontinued     750 mg 150 mL/hr over 60 Minutes Intravenous Every 12 hours 02/21/15 1239 02/23/15 1232   02/21/15 1300  meropenem (MERREM) 1 g in sodium chloride 0.9 % 100 mL IVPB  Status:  Discontinued     1 g 200 mL/hr over 30 Minutes Intravenous Every 12 hours 02/21/15 1208 02/23/15 1232   02/21/15 1245  vancomycin (VANCOCIN) IVPB 1000 mg/200 mL premix     1,000 mg 200 mL/hr over 60 Minutes Intravenous  Once 02/21/15 1239 02/21/15 1443   02/21/15 1230  vancomycin (VANCOCIN) 1,500 mg in sodium chloride 0.9 % 500 mL IVPB  Status:  Discontinued     1,500 mg 250 mL/hr over 120 Minutes Intravenous  Once 02/21/15 1224 02/21/15 1239   02/20/15 2200  piperacillin-tazobactam (ZOSYN) IVPB 3.375 g  Status:  Discontinued     3.375 g 12.5 mL/hr over 240 Minutes Intravenous 3 times per day 02/20/15 2024 02/21/15 1208   02/20/15 1515  levofloxacin (LEVAQUIN) IVPB 500 mg     500 mg 100 mL/hr over 60 Minutes Intravenous  Once 02/20/15 1511 02/20/15 1610      Medications   Scheduled Meds: . amLODipine  10 mg Oral Daily  .  atorvastatin  80 mg Oral QHS  . calcium-vitamin D  1 tablet Oral BID  . carbamide peroxide  2 drop Both Ears Once per day on Mon Thu  . cefTRIAXone (ROCEPHIN)  IV  1 g Intravenous Q24H  . clindamycin (CLEOCIN) IV  300 mg Intravenous 3 times per day  . gabapentin  300 mg Oral TID  . insulin aspart  0-5 Units Subcutaneous QHS  . insulin aspart  0-9 Units Subcutaneous TID WC  . insulin glargine  43 Units Subcutaneous Daily  . ipratropium-albuterol  3 mL Nebulization Q6H  . losartan  100 mg Oral Daily  . metoprolol  50 mg Oral BID  . multivitamin-lutein  1 capsule Oral BID  . nystatin   Topical BID  . polyethylene glycol  17 g Oral BID  . psyllium  1 packet Oral TID  . QUEtiapine  50 mg Oral QHS  . traZODone  150 mg Oral QHS  . vitamin B-12  1,000 mcg Oral Daily  . Warfarin - Physician Dosing Inpatient   Does not apply q1800   Continuous Infusions:   PRN Meds:.acetaminophen **OR** acetaminophen, alum & mag hydroxide-simeth, bisacodyl, HYDROcodone-acetaminophen, HYDROmorphone (DILAUDID) injection, ibuprofen, ondansetron **OR** ondansetron (ZOFRAN) IV, senna-docusate   Data Review:   Micro Results Recent Results (from the past 240 hour(s))  Culture, blood (routine x 2)     Status: None (Preliminary result)   Collection Time: 02/20/15  2:39 PM  Result Value Ref Range Status   Specimen Description BLOOD RIGHT HAND  Final   Special Requests BOTTLES DRAWN AEROBIC AND ANAEROBIC  1CC  Final   Culture NO GROWTH 3 DAYS  Final   Report Status PENDING  Incomplete  Culture, blood (  routine x 2)     Status: None (Preliminary result)   Collection Time: 02/20/15  2:39 PM  Result Value Ref Range Status   Specimen Description BLOOD LEFT ANTECUBITAL  Final   Special Requests BOTTLES DRAWN AEROBIC AND ANAEROBIC  1CC  Final   Culture NO GROWTH 3 DAYS  Final   Report Status PENDING  Incomplete  Urine culture     Status: None   Collection Time: 02/20/15  4:00 PM  Result Value Ref Range Status    Specimen Description URINE, RANDOM  Final   Special Requests NONE  Final   Culture >=100,000 COLONIES/mL PROTEUS MIRABILIS  Final   Report Status 02/22/2015 FINAL  Final   Organism ID, Bacteria PROTEUS MIRABILIS  Final      Susceptibility   Proteus mirabilis - MIC*    AMPICILLIN >=32 RESISTANT Resistant     CEFAZOLIN 8 SENSITIVE Sensitive     CEFTRIAXONE <=1 SENSITIVE Sensitive     CIPROFLOXACIN >=4 RESISTANT Resistant     GENTAMICIN >=16 RESISTANT Resistant     IMIPENEM 2 SENSITIVE Sensitive     NITROFURANTOIN 128 RESISTANT Resistant     TRIMETH/SULFA >=320 RESISTANT Resistant     AMPICILLIN/SULBACTAM >=32 RESISTANT Resistant     PIP/TAZO <=4 SENSITIVE Sensitive     * >=100,000 COLONIES/mL PROTEUS MIRABILIS  Rapid Influenza A&B Antigens ([facility] only)     Status: None   Collection Time: 02/20/15  5:20 PM  Result Value Ref Range Status   Influenza A ([place]) NEGATIVE  Final   Influenza B ([facility]) NEGATIVE  Final  MRSA PCR Screening     Status: None   Collection Time: 02/20/15  8:55 PM  Result Value Ref Range Status   MRSA by PCR NEGATIVE NEGATIVE Final    Comment:        The GeneXpert MRSA Assay (FDA approved for NASAL specimens only), is one component of a comprehensive MRSA colonization surveillance program. It is not intended to diagnose MRSA infection nor to guide or monitor treatment for MRSA infections.     Radiology Reports Dg Chest 1 View  02/20/2015  CLINICAL DATA:  Mildly productive cough and wheezing for the past 2 days ; EXAM: CHEST 1 VIEW PA with one view lateral chest x-ray. The images are duplicated in PACs. The PA was performed at the [facility] outpatient facility but the patient then faint it and was transported to a [facility] where the lateral was performed. COMPARISON:  PA and lateral chest x-ray of November 05, 2014 FINDINGS: The lungs are adequately inflated. There is mildly increased density in the left lower lobe posteriorly. There is no pleural effusion. The  heart is top-normal in size. The pulmonary vascularity is normal. The 7 sternal wires are intact. 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 There is calcification in the wall of the thoracic aorta. The bony thorax exhibits no acute abnormality. IMPRESSION: Subsegmental atelectasis or early pneumonia in the left lower lobe. There is no evidence of pulmonary edema. Followup PA and lateral chest X-ray is recommended in 3-4 weeks following trial of antibiotic therapy to ensure resolution and exclude underlying malignancy. Electronically Signed   By: David  Martinique M.D.   On: 02/20/2015 12:05   Dg Chest 1 View  02/20/2015  CLINICAL DATA:  Mildly productive cough and wheezing for the past 2 days ; EXAM: CHEST 1 VIEW PA with one view lateral chest x-ray. The images are duplicated in PACs. The PA was performed at the [facility] outpatient facility  but the patient then faint it and was transported to a [facility] where the lateral was performed. COMPARISON:  PA and lateral chest x-ray of November 05, 2014 FINDINGS: The lungs are adequately inflated. There is mildly increased density in the left lower lobe posteriorly. There is no pleural effusion. The heart is top-normal in size. The pulmonary vascularity is normal. The 7 sternal wires are intact. 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 There is calcification in the wall of the thoracic aorta. The bony thorax exhibits no acute abnormality. IMPRESSION: Subsegmental atelectasis or early pneumonia in the left lower lobe. There is no evidence of pulmonary edema. Followup PA and lateral chest X-ray is recommended in 3-4 weeks following trial of antibiotic therapy to ensure resolution and exclude underlying malignancy. Electronically Signed   By: David  Martinique M.D.   On: 02/20/2015 12:05   Dg Ribs Unilateral Right  02/20/2015  CLINICAL DATA:  Right-sided rib pain following fall, initial encounter EXAM: RIGHT RIBS - 2 VIEW COMPARISON:  None. FINDINGS: Mildly displaced fractures  of the eighth, ninth and tenth ribs are noted on the right posteriorly. No other fractures are seen. No underlying pneumothorax is noted. IMPRESSION: Multiple rib fractures posteriorly on the right consistent with the recent injury. No pneumothorax noted. Electronically Signed   By: Inez Catalina M.D.   On: 02/20/2015 12:32   Ct Head Wo Contrast  02/20/2015  CLINICAL DATA:  Fall today, on Coumadin.  History of stroke. EXAM: CT HEAD WITHOUT CONTRAST TECHNIQUE: Contiguous axial images were obtained from the base of the skull through the vertex without intravenous contrast. COMPARISON:  08/03/2013 FINDINGS: There is atrophy and chronic small vessel disease changes. Old right occipital infarct. No acute intracranial abnormality. Specifically, no hemorrhage, hydrocephalus, mass lesion, acute infarction, or significant intracranial injury. No acute calvarial abnormality. Visualized paranasal sinuses and mastoids clear. Orbital soft tissues unremarkable. IMPRESSION: Old right occipital infarct. No acute intracranial abnormality. Atrophy, chronic microvascular disease. Electronically Signed   By: Rolm Baptise M.D.   On: 02/20/2015 16:46   Dg Chest Port 1 View  02/20/2015  CLINICAL DATA:  Status post fall today with multiple right rib fractures. Initial encounter. EXAM: PORTABLE CHEST 1 VIEW COMPARISON:  PA and lateral chest and dedicated plain films of the ribs earlier today. FINDINGS: There is no pneumothorax. The lungs are clear. Heart size is enlarged. Right rib fractures are not as well seen on this study as on dedicated plain films of the ribs. IMPRESSION: Negative for pneumothorax. Right rib fractures are better demonstrated on plain films earlier today. Cardiomegaly. Electronically Signed   By: Inge Rise M.D.   On: 02/20/2015 17:47     CBC  Recent Labs Lab 02/20/15 1439 02/21/15 0353 02/22/15 0335 02/23/15 0408  WBC 14.1* 9.8 9.7 10.3  HGB 12.4* 10.6* 10.6* 10.8*  HCT 37.2* 31.2* 31.5* 32.0*   PLT 185 162 173 193  MCV 83.8 83.8 85.5 84.4  MCH 27.9 28.6 28.7 28.4  MCHC 33.3 34.1 33.5 33.7  RDW 15.3* 15.5* 15.2* 15.3*  LYMPHSABS 0.6*  --   --   --   MONOABS 1.3*  --   --   --   EOSABS 0.0  --   --   --   BASOSABS 0.0  --   --   --     Chemistries   Recent Labs Lab 02/20/15 1551 02/21/15 0353 02/22/15 0335 02/23/15 0408  NA 128* 130* 135 138  K 4.3 4.1 4.0 4.1  CL  96* 99* 102 105  CO2 24 26 25 27   GLUCOSE 191* 153* 169* 127*  BUN 39* 34* 31* 28*  CREATININE 1.70* 1.57* 1.62* 1.22  CALCIUM 8.3* 8.2* 8.3* 8.2*  AST 23  --   --   --   ALT 19  --   --   --   ALKPHOS 76  --   --   --   BILITOT 1.1  --   --   --    ------------------------------------------------------------------------------------------------------------------ estimated creatinine clearance is 57.7 mL/min (by C-G formula based on Cr of 1.22). ------------------------------------------------------------------------------------------------------------------ No results for input(s): HGBA1C in the last 72 hours. ------------------------------------------------------------------------------------------------------------------ No results for input(s): CHOL, HDL, LDLCALC, TRIG, CHOLHDL, LDLDIRECT in the last 72 hours. ------------------------------------------------------------------------------------------------------------------ No results for input(s): TSH, T4TOTAL, T3FREE, THYROIDAB in the last 72 hours.  Invalid input(s): FREET3 ------------------------------------------------------------------------------------------------------------------ No results for input(s): VITAMINB12, FOLATE, FERRITIN, TIBC, IRON, RETICCTPCT in the last 72 hours.  Coagulation profile  Recent Labs Lab 02/20/15 1439 02/23/15 0408  INR 3.00 5.59*    No results for input(s): DDIMER in the last 72 hours.  Cardiac Enzymes No results for input(s): CKMB, TROPONINI, MYOGLOBIN in the last 168 hours.  Invalid input(s):  CK ------------------------------------------------------------------------------------------------------------------ Invalid input(s): POCBNP    Assessment & Plan   75 year male with dementia and atrial fibrillation on anticoagulation who initially presented for an outpatient chest x-ray for cough and subsequently fell due to weakness and now separated rib fracture.  1. Aspiration pneumonia   Patient also has a urinary tract infection with Proteus  His urine cultures sensitive to ceftriaxone I will change him to ceftriaxone and clindamycin for aspiration Seen by speech Repeat chest x-ray today 2. Rib fracture: Incentive spirometry if patient able to do 3. Dementia: Observe patient for signs of delirium. continue Seroquel and trazodone  4. Diabetes: Continue sliding scale insulin  5. Atrial fibrillation -INR elevated hold Coumadin today 6. Essential hypertension: Continue metoprolol, Norvasc and losartan   Patient was DO NOT RESUSCITATE his wife changed him to full code I will ask palliative care team to see on Monday  Code Status History    Date Active Date Inactive Code Status Order ID Comments User Context   02/20/2015  8:18 PM 02/20/2015  8:19 PM DNR [id number]  Bettey Costa, MD Inpatient   02/20/2015  7:55 PM 02/20/2015  8:18 PM Full Code [id number]  Bettey Costa, MD ED   12/12/2014  9:38 AM 12/13/2014  3:23 AM Full Code [id number]  Inez Catalina, MD HOV    Questions for Most Recent Historical Code Status (Order [id number])    Question Answer Comment   In the event of cardiac or respiratory ARREST Do not call a "code blue"    In the event of cardiac or respiratory ARREST Do not perform Intubation, CPR, defibrillation or ACLS    In the event of cardiac or respiratory ARREST Use medication by any route, position, wound care, and other measures to relive pain and suffering. May use oxygen, suction and manual treatment of airway obstruction as needed for comfort.     Advance Directive  Documentation        Most Recent Value   Type of Advance Directive  Healthcare Power of Attorney   Pre-existing out of facility DNR order (yellow form or pink MOST form)     "MOST" Form in Place?             Consults  none  DVT Prophylaxis  coumadin  Lab Results  Component Value Date   PLT 193 02/23/2015     Time Spent in minutes  22min   Anav Lammert, Chana Bode M.D on 02/23/2015 at 1:51 PM  Between 7am to 6pm - Pager - [phone]  After 6pm go to [email or web address] - password [email or web address] [facility] [place] [facility]   Office  [phone]

## 2015-02-23 NOTE — Progress Notes (Signed)
PT Cancellation Note  Patient Details Name: Bobby Jacobs MRN: [id number] DOB: 1940/12/03   Cancelled Treatment:    Reason Eval/Treat Not Completed: Medical issues which prohibited therapy. PT reviewed chart, patient currently with INR value of [id number], per rehab policy any INR value above 5 is considered bed rest only. PT will continue to follow and monitor to re-attempt when patient is more medically appropriate.   Kerman Passey, PT, DPT    02/23/2015, 11:07 AM

## 2015-02-23 NOTE — NC FL2 (Signed)
[place] LEVEL OF CARE SCREENING TOOL     IDENTIFICATION  Patient Name: Bobby Jacobs Birthdate: [birth year] Sex: male Admission Date (Current Location): 02/20/2015  [place] and [place] Number:  Engineering geologist and Address:  [facility], [address] [place], [place] [address]      Provider Number: [id number]  Attending Physician Name and Address:  Dustin Flock, MD  Relative Name and Phone Number:       Current Level of Care: Hospital Recommended Level of Care:  SNF  Prior Approval Number:    Date Approved/Denied:   PASRR Number:   [id number]   Discharge Plan:  SNF     Current Diagnoses: Patient Active Problem List   Diagnosis Date Noted  . Pneumonia 02/20/2015  . 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 Urinary tract infectious disease 07/21/2014  . Incontinence 07/21/2014  . Urinary retention 07/18/2014  . BPH (benign prostatic hypertrophy) with urinary retention 07/18/2014  . Pure hypercholesterolemia 08/27/2013  . Type II or unspecified type diabetes mellitus with neurological manifestations, uncontrolled 08/20/2013  . Anemia of other chronic disease 08/15/2013  . Atrial fibrillation ([place]) 08/08/2013  . Congestive heart failure, unspecified 08/08/2013  . Urinary tract infection, site not specified 08/08/2013  . Vascular dementia 08/08/2013  . Tremor 07/03/2013    Orientation RESPIRATION BLADDER Height & Weight    Self  O2 Incontinent 5\' 10"  (177.8 cm) 195 lbs.  BEHAVIORAL SYMPTOMS/MOOD NEUROLOGICAL BOWEL NUTRITION STATUS      Incontinent Diet (Carb modified)  AMBULATORY STATUS COMMUNICATION OF NEEDS Skin   Extensive Assist Verbally Normal                       Personal Care Assistance Level of Assistance  Bathing, Feeding, Dressing Bathing Assistance: Maximum assistance Feeding assistance: Limited assistance Dressing Assistance: Maximum assistance     Functional Limitations Info  Sight,  Hearing, Speech Sight Info: Adequate Hearing Info: Adequate Speech Info: Adequate    SPECIAL CARE FACTORS FREQUENCY   PT  OT   5 days per week  5 days per week                 Contractures      Additional Factors Info  Allergies   Allergies Info:  (NKA)           Current Medications (02/23/2015):  This is the current hospital active medication list Current Facility-Administered Medications  Medication Dose Route Frequency Provider Last Rate Last Dose  . 0.9 %  sodium chloride infusion   Intravenous Continuous Bettey Costa, MD 75 mL/hr at 02/22/15 2305    . acetaminophen (TYLENOL) tablet 650 mg  650 mg Oral Q6H PRN Bettey Costa, MD   650 mg at 02/21/15 1919   Or  . acetaminophen (TYLENOL) suppository 650 mg  650 mg Rectal Q6H PRN Bettey Costa, MD      . alum & mag hydroxide-simeth (MAALOX/MYLANTA) 200-200-20 MG/5ML suspension 30 mL  30 mL Oral Q6H PRN Sital Mody, MD      . amLODipine (NORVASC) tablet 10 mg  10 mg Oral Daily Bettey Costa, MD   10 mg at 02/22/15 0849  . atorvastatin (LIPITOR) tablet 80 mg  80 mg Oral QHS Bettey Costa, MD   80 mg at 02/22/15 2254  . bisacodyl (DULCOLAX) EC tablet 5 mg  5 mg Oral Daily PRN Bettey Costa, MD      . calcium-vitamin D (OSCAL WITH D) 500-200 MG-UNIT per tablet  1 tablet  1 tablet Oral BID Bettey Costa, MD   1 tablet at 02/22/15 2253  . carbamide peroxide (DEBROX) 6.5 % otic solution 2 drop  2 drop Both Ears Once per day on Mon Thu Bettey Costa, MD      . gabapentin (NEURONTIN) capsule 300 mg  300 mg Oral TID Bettey Costa, MD   300 mg at 02/22/15 2255  . HYDROcodone-acetaminophen (NORCO/VICODIN) 5-325 MG per tablet 1-2 tablet  1-2 tablet Oral Q4H PRN Bettey Costa, MD   1 tablet at 02/21/15 1351  . HYDROmorphone (DILAUDID) injection 0.5 mg  0.5 mg Intravenous Q4H PRN Bettey Costa, MD   0.5 mg at 02/20/15 2103  . ibuprofen (ADVIL,MOTRIN) tablet 400 mg  400 mg Oral Q6H PRN Dustin Flock, MD   400 mg at 02/22/15 0849  . insulin aspart (novoLOG) injection 0-5  Units  0-5 Units Subcutaneous QHS Bettey Costa, MD   2 Units at 02/21/15 2137  . insulin aspart (novoLOG) injection 0-9 Units  0-9 Units Subcutaneous TID WC Bettey Costa, MD   3 Units at 02/22/15 1634  . insulin glargine (LANTUS) injection 43 Units  43 Units Subcutaneous Daily Bettey Costa, MD   43 Units at 02/22/15 0851  . ipratropium-albuterol (DUONEB) 0.5-2.5 (3) MG/3ML nebulizer solution 3 mL  3 mL Nebulization Q6H Dustin Flock, MD   3 mL at 02/23/15 0740  . losartan (COZAAR) tablet 100 mg  100 mg Oral Daily Bettey Costa, MD   100 mg at 02/22/15 0847  . meropenem (MERREM) 1 g in sodium chloride 0.9 % 100 mL IVPB  1 g Intravenous Q12H Dustin Flock, MD   1 g at 02/23/15 0147  . metoprolol (LOPRESSOR) tablet 50 mg  50 mg Oral BID Bettey Costa, MD   50 mg at 02/22/15 2255  . multivitamin-lutein (OCUVITE-LUTEIN) capsule 1 capsule  1 capsule Oral BID Bettey Costa, MD   1 capsule at 02/22/15 2255  . nystatin (MYCOSTATIN/NYSTOP) topical powder   Topical BID Dustin Flock, MD      . ondansetron (ZOFRAN) tablet 4 mg  4 mg Oral Q6H PRN Bettey Costa, MD       Or  . ondansetron (ZOFRAN) injection 4 mg  4 mg Intravenous Q6H PRN Sital Mody, MD      . polyethylene glycol (MIRALAX / GLYCOLAX) packet 17 g  17 g Oral BID Bettey Costa, MD   17 g at 02/22/15 2255  . psyllium (HYDROCIL/METAMUCIL) packet 1 packet  1 packet Oral TID Bettey Costa, MD   1 packet at 02/22/15 2255  . QUEtiapine (SEROQUEL) tablet 50 mg  50 mg Oral QHS Bettey Costa, MD   50 mg at 02/21/15 2142  . senna-docusate (Senokot-S) tablet 1 tablet  1 tablet Oral QHS PRN Bettey Costa, MD   1 tablet at 02/22/15 2254  . traZODone (DESYREL) tablet 150 mg  150 mg Oral QHS Bettey Costa, MD   150 mg at 02/22/15 2254  . vancomycin (VANCOCIN) IVPB 750 mg/150 ml premix  750 mg Intravenous Q12H Dustin Flock, MD   750 mg at 02/23/15 0654  . vitamin B-12 (CYANOCOBALAMIN) tablet 1,000 mcg  1,000 mcg Oral Daily Bettey Costa, MD   1,000 mcg at 02/22/15 0847  . Warfarin - Physician  Dosing Inpatient   Does not apply [id number] Bettey Costa, MD         Discharge Medications: Please see discharge summary for a list of discharge medications.  Relevant Imaging Results:  Relevant Lab Results:  Additional Information  (SS: [id number])  Loralyn Freshwater, LCSW

## 2015-02-23 NOTE — Consult Note (Signed)
ANTICOAGULATION CONSULT NOTE - Initial Consult  Pharmacy Consult for warfarin Indication: atrial fibrillation  No Known Allergies  Patient Measurements: Height: 5\' 10"  (177.8 cm) Weight: 195 lb (88.451 kg) IBW/kg (Calculated) : 73  Vital Signs: Temp: 99.3 F (37.4 C) (01/30 0804) Temp Source: Axillary (01/30 0804) BP: 122/89 mmHg (01/30 0803) Pulse Rate: 95 (01/30 0803)  Labs:  Recent Labs  02/20/15 1439  02/21/15 0353 02/22/15 0335 02/23/15 0408  HGB 12.4*  --  10.6* 10.6* 10.8*  HCT 37.2*  --  31.2* 31.5* 32.0*  PLT 185  --  162 173 193  APTT 47*  --   --   --   --   LABPROT 30.6*  --   --   --  48.9*  INR 3.00  --   --   --  5.59*  CREATININE  --   < > 1.57* 1.62* 1.22  < > = values in this interval not displayed.  Estimated Creatinine Clearance: 57.7 mL/min (by C-G formula based on Cr of 1.22).   Medical History: Past Medical History  Diagnosis Date  . Hypertension   . Skin cancer   . Coronary artery disease   . Depression   . GERD (gastroesophageal reflux disease)   . Cardiovascular disease   . High cholesterol   . Pneumonia   . Fracture   . Obesity   . Sleep apnea   . CHF (congestive heart failure) ([place])   . Dementia   . Atrial fibrillation ([place])   . Urinary retention   . Urine frequency   . Incomplete bladder emptying   . Diabetes mellitus, type 2 ([place])   . Stroke ([place])   . Peripheral vascular disease ([place])   . Aortic aneurysm ([place])   . Aortic valve replaced   . Hyperthyroidism     Medications:  Scheduled:  . amLODipine  10 mg Oral Daily  . atorvastatin  80 mg Oral QHS  . calcium-vitamin D  1 tablet Oral BID  . carbamide peroxide  2 drop Both Ears Once per day on Mon Thu  . cefTRIAXone (ROCEPHIN)  IV  1 g Intravenous Q24H  . clindamycin (CLEOCIN) IV  300 mg Intravenous 3 times per day  . gabapentin  300 mg Oral TID  . insulin aspart  0-5 Units Subcutaneous QHS  . insulin aspart  0-9 Units Subcutaneous TID WC  . insulin glargine  43  Units Subcutaneous Daily  . ipratropium-albuterol  3 mL Nebulization Q6H  . losartan  100 mg Oral Daily  . metoprolol  50 mg Oral BID  . multivitamin-lutein  1 capsule Oral BID  . nystatin   Topical BID  . polyethylene glycol  17 g Oral BID  . psyllium  1 packet Oral TID  . QUEtiapine  50 mg Oral QHS  . traZODone  150 mg Oral QHS  . vitamin B-12  1,000 mcg Oral Daily  . Warfarin - Physician Dosing Inpatient   Does not apply q1800   Assessment: Bobby Jacobs is a 75yo male admitted for weakness and cough. Pt on warfarin as outpatient for Afib. OP regimen is warfarin 7.5mg  PO QHS. Patient's INR on admit was 3.  Goal of Therapy:  INR 2-3 Monitor platelets by anticoagulation protocol: Yes   Plan:  1/27 admit INR 3 1/30 INR 5.59. Supratherapeutic. Hold dose and recheck INR with AM labs.  Thank you for including pharmacy in this patient's care. Pharmacy will continue to monitor.  Roe Coombs, PharmD Pharmacy Resident 02/23/2015

## 2015-02-23 NOTE — Evaluation (Signed)
Clinical/Bedside Swallow Evaluation Patient Details  Name: Bobby Jacobs MRN: [id number] Date of Birth: Mar 19, 1940  Today's Date: 02/23/2015 Time: SLP Start Time (ACUTE ONLY): [id number] SLP Stop Time (ACUTE ONLY): 1135 SLP Time Calculation (min) (ACUTE ONLY): 60 min  Past Medical History:  Past Medical History  Diagnosis Date  . Hypertension   . Skin cancer   . Coronary artery disease   . Depression   . GERD (gastroesophageal reflux disease)   . Cardiovascular disease   . High cholesterol   . Pneumonia   . Fracture   . Obesity   . Sleep apnea   . CHF (congestive heart failure) ([place])   . Dementia   . Atrial fibrillation ([place])   . Urinary retention   . Urine frequency   . Incomplete bladder emptying   . Diabetes mellitus, type 2 ([place])   . Stroke ([place])   . Peripheral vascular disease ([place])   . Aortic aneurysm ([place])   . Aortic valve replaced   . Hyperthyroidism    Past Surgical History:  Past Surgical History  Procedure Laterality Date  . Hernia repair    . Cardiac surgery    . Neck surgery    . Degenerative disk disease    . Circumcision    . Coronary artery bypass graft    . Suprapubic catheter surgery  09/22/2014    placement   HPI:  Pt is a 75 y.o. male with a known dxs of Dementia, Obesity, sleep apnea, HTN, bladder issues w/ foley,  GERD, CHF, stroke, diabetes, and multiple other medical issues who presents with above complaint. Patient had cough and wheezing a few days ago and was requested for outpatient chest x-ray. Outpatient chest x-ray was ordered which shows left lower lobe pneumonia. During the x-ray the patient fell off the stool landing on his right ribs. He presents to the emergency room due to rib pain pain. X-ray does confirm rib fractures. Pt resides at a NH and has a hired Building control surveyor at his place of residence. He is verbally responsive w/ some confusion noted in communication exchange. Wife and caregive report pt is impulsive when he eats and has to  be monitored in order that he not "put so much food in his mouth at a time". Wife endorsed episodes of getting choked w/ food and drink "when he put so much in his mouth". Caregiver indicated pt has been coughing at the NH when drinking liquids (only) "sometimes"; pt has not been dx'd w/ any respiratory dis. "in recent years" per wife. Currently, pt is on a dysphagia diet per caregiver request.   Assessment / Plan / Recommendation Clinical Impression  Pt has baseline cognitive decline and is at increased risk for aspiration. Pt was awake/alert but required verbal cues and monitoring to fully attend to task of eating and drinking d/t impulsivity/confusion. Pt appeared to adequately tolerate single trials of thin liquids via cup and puree/soft solids w/ no immediate, overt pharyngeal phase dysphagia noted, however, on the 12th trial when more visitors had arrived, pt exhibited coughing w/ po trial of thin liquids. No overt s/s of aspiration noted w/ trials of Nectar liquids. Oral phase deficits were noted c/b adequate oral phase time; adequate mastication of increased textures and appropriate clearing. Bolus sizes and amounts were controlled by SLP - single bites and sips. Pt appears at min. risk for aspiration d/t declined Cognitive status, baseline impulsive eating/drinking(Dysphagia), and acute medical issues. Rec. a Dys. 3 diet w/ Nectar liquids; monitoring at  meals for bolus control and eating behavior; meds in Puree crushed as able w/ NSG. Rec. ST f/u for monitoring of toleration of diet.     Aspiration Risk  Mild aspiration risk    Diet Recommendation  Dys. 3 w/ thin liquids; aspiration precautions; monitoring at meals for impulsive eating behavior - single, small bites and sips w/ oral clearing b/t trials.   Medication Administration: Crushed with puree    Other  Recommendations Recommended Consults:  (Dietician) Oral Care Recommendations: Oral care BID;Staff/trained caregiver to provide oral  care Other Recommendations: Order thickener from pharmacy;Prohibited food (jello, ice cream, thin soups);Remove water pitcher   Follow up Recommendations   (TBD)    Frequency and Duration min 2x/week  2 weeks       Prognosis Prognosis for Safe Diet Advancement: Fair Barriers to Reach Goals: Cognitive deficits;Behavior Barriers/Prognosis Comment: impulsivity      Swallow Study   General Date of Onset: 02/20/15 HPI: Pt is a 75 y.o. male with a known dxs of Dementia, Obesity, sleep apnea, HTN, bladder issues w/ foley,  GERD, CHF, stroke, diabetes, and multiple other medical issues who presents with above complaint. Patient had cough and wheezing a few days ago and was requested for outpatient chest x-ray. Outpatient chest x-ray was ordered which shows left lower lobe pneumonia. During the x-ray the patient fell off the stool landing on his right ribs. He presents to the emergency room due to rib pain pain. X-ray does confirm rib fractures. Pt resides at a NH and has a hired Building control surveyor at his place of residence. He is verbally responsive w/ some confusion noted in communication exchange. Wife and caregive report pt is impulsive when he eats and has to be monitored in order that he not "put so much food in his mouth at a time". Wife endorsed episodes of getting choked w/ food and drink "when he put so much in his mouth". Caregiver indicated pt has been coughing at the NH when drinking liquids (only) "sometimes"; pt has not been dx'd w/ any respiratory dis. "in recent years" per wife. Currently, pt is on a dysphagia diet per caregiver request. Type of Study: Bedside Swallow Evaluation Previous Swallow Assessment: none Diet Prior to this Study: Dysphagia 3 (soft);Nectar-thick liquids Temperature Spikes Noted: Yes (x1; wbc 9.7-10.3 while admitted) Respiratory Status: Nasal cannula (3 liters; BiPAP also (baseline)) History of Recent Intubation: No Behavior/Cognition: Alert;Cooperative;Pleasant  mood;Confused;Requires cueing;Distractible Oral Cavity Assessment: Within Functional Limits;Dry (min.) Oral Care Completed by SLP: Yes Oral Cavity - Dentition: Adequate natural dentition Vision: Functional for self-feeding Self-Feeding Abilities: Needs assist;Needs set up (needs monitoring d/t Cognitive decline/impulsivity) Patient Positioning: Upright in bed Baseline Vocal Quality: Normal Volitional Cough: Strong Volitional Swallow: Able to elicit    Oral/Motor/Sensory Function Overall Oral Motor/Sensory Function: Within functional limits   Ice Chips Ice chips: Within functional limits Presentation: Spoon (fed; 2 trials) Other Comments: wanted "water"   Thin Liquid Thin Liquid: Impaired Presentation: Cup;Self Fed (assisted; 12 trials total) Oral Phase Impairments:  (none) Oral Phase Functional Implications:  (none) Pharyngeal  Phase Impairments: Cough - Immediate (x1/12 trials) Other Comments: pt required bolus monitoring and assistance w/ holding cup then bolus size monitoring d/t impulsivity when drinking    Nectar Thick Nectar Thick Liquid: Within functional limits Presentation: Cup (assisted; 2 trials)   Honey Thick Honey Thick Liquid: Not tested   Puree Puree: Within functional limits Presentation: Spoon (fed; 3 trials)   Solid   GO   Solid: Within functional limits Presentation:  (  fed; 8 trials) Other Comments: presented 1 at a time w/ time b/t trials to use dry swallows to clear completely       Orinda Kenner, MS, CCC-SLP  Kemaya Dorner 02/23/2015,3:14 PM

## 2015-02-24 ENCOUNTER — Inpatient Hospital Stay: Payer: Medicare Other

## 2015-02-24 LAB — BASIC METABOLIC PANEL
ANION GAP: 4 — AB (ref 5–15)
BUN: 24 mg/dL — ABNORMAL HIGH (ref 6–20)
CO2: 26 mmol/L (ref 22–32)
Calcium: 7.8 mg/dL — ABNORMAL LOW (ref 8.9–10.3)
Chloride: 103 mmol/L (ref 101–111)
Creatinine, Ser: 1.26 mg/dL — ABNORMAL HIGH (ref 0.61–1.24)
GFR calc non Af Amer: 54 mL/min — ABNORMAL LOW (ref >60)
Glucose, Bld: 141 mg/dL — ABNORMAL HIGH (ref 65–99)
Potassium: 3.8 mmol/L (ref 3.5–5.1)
SODIUM: 133 mmol/L — AB (ref 135–145)

## 2015-02-24 LAB — GLUCOSE, CAPILLARY
GLUCOSE-CAPILLARY: 122 mg/dL — AB (ref 65–99)
GLUCOSE-CAPILLARY: 154 mg/dL — AB (ref 65–99)
Glucose-Capillary: 164 mg/dL — ABNORMAL HIGH (ref 65–99)
Glucose-Capillary: 240 mg/dL — ABNORMAL HIGH (ref 65–99)

## 2015-02-24 LAB — BLOOD GAS, ARTERIAL
Acid-Base Excess: 5.5 mmol/L — ABNORMAL HIGH (ref 0.0–3.0)
Allens test (pass/fail): POSITIVE — AB
BICARBONATE: 28.7 meq/L — AB (ref 21.0–28.0)
FIO2: 0.32
O2 Saturation: 95.4 %
PATIENT TEMPERATURE: 37
PCO2 ART: 36 mmHg (ref 32.0–48.0)
PO2 ART: 70 mmHg — AB (ref 83.0–108.0)
pH, Arterial: 7.51 — ABNORMAL HIGH (ref 7.350–7.450)

## 2015-02-24 LAB — CBC
HEMATOCRIT: 32.8 % — AB (ref 40.0–52.0)
Hemoglobin: 10.8 g/dL — ABNORMAL LOW (ref 13.0–18.0)
MCH: 27.7 pg (ref 26.0–34.0)
MCHC: 33 g/dL (ref 32.0–36.0)
MCV: 84.1 fL (ref 80.0–100.0)
PLATELETS: 221 10*3/uL (ref 150–440)
RBC: 3.9 MIL/uL — ABNORMAL LOW (ref 4.40–5.90)
RDW: 15.1 % — AB (ref 11.5–14.5)
WBC: 12.9 10*3/uL — AB (ref 3.8–10.6)

## 2015-02-24 LAB — PROTIME-INR
INR: 8.2 — AB
Prothrombin Time: 65.2 seconds — ABNORMAL HIGH (ref 11.4–15.0)

## 2015-02-24 LAB — BRAIN NATRIURETIC PEPTIDE: B Natriuretic Peptide: 263 pg/mL — ABNORMAL HIGH (ref 0.0–100.0)

## 2015-02-24 MED ORDER — FUROSEMIDE 10 MG/ML IJ SOLN
20.0000 mg | Freq: Two times a day (BID) | INTRAMUSCULAR | Status: DC
Start: 2015-02-24 — End: 2015-03-02
  Administered 2015-02-24 – 2015-03-02 (×13): 20 mg via INTRAVENOUS
  Filled 2015-02-24 (×13): qty 2

## 2015-02-24 MED ORDER — VITAMIN K1 10 MG/ML IJ SOLN
10.0000 mg | Freq: Once | INTRAVENOUS | Status: AC
  Administered 2015-02-24: 10 mg via INTRAVENOUS
  Filled 2015-02-24: qty 1

## 2015-02-24 MED ORDER — IOHEXOL 300 MG/ML  SOLN
75.0000 mL | Freq: Once | INTRAMUSCULAR | Status: AC | PRN
  Administered 2015-02-24: 75 mL via INTRAVENOUS

## 2015-02-24 MED ORDER — IPRATROPIUM-ALBUTEROL 0.5-2.5 (3) MG/3ML IN SOLN
3.0000 mL | Freq: Three times a day (TID) | RESPIRATORY_TRACT | Status: DC
  Administered 2015-02-24 – 2015-03-02 (×15): 3 mL via RESPIRATORY_TRACT
  Filled 2015-02-24 (×17): qty 3

## 2015-02-24 NOTE — Addendum Note (Signed)
Encounter addended by: Kristin Bruins on: 02/24/2015  9:27 AM<BR>     Documentation filed: Charges VN

## 2015-02-24 NOTE — Progress Notes (Signed)
Pt with improved attention/responsiveness by end of shift. Answers questions appropriately. Caregiver from home and wife at bedside. Pt ate well for dinner meal, assisted with feeding, no s/s of aspiration this shift.

## 2015-02-24 NOTE — Progress Notes (Addendum)
Noted change in pt status, decreased responses, delayed responses. Pinpoint pupils with sluggish response to light. Irregular pulse wit hx. of a-fib. Cool, clammy skin. BG 164. VS WNL. Pt has been on/off CPaP throughout the day due to drowsiness. Paged MD. Notified MD. New orders per Dr. Posey Pronto for ABG's. Dr. Benjie Karvonen came to bedside, assessed pt. New orders for stat head CT.

## 2015-02-24 NOTE — Progress Notes (Signed)
Called patients wife no answer

## 2015-02-24 NOTE — Plan of Care (Signed)
Problem: SLP Dysphagia Goals Goal: Misc Dysphagia Goal Pt will safely tolerate po diet of least restrictive consistency w/ no overt s/s of aspiration noted by Staff/pt/family x3 sessions.    

## 2015-02-24 NOTE — Progress Notes (Signed)
[facility]                                                                                                                                                                                            Patient Demographics   Bobby Jacobs, is a 75 y.o. male, DOB - 26-Nov-1940, [id number]  Admit date - 02/20/2015   Admitting Physician Hillary Bow, MD  Outpatient Primary MD for the patient is [name]., PA   LOS - 4  Subjective:   Doing better off oxygen currently. However his chest x-ray suggestive of possible CHF   Review of Systems:   CONSTITUTIONAL: Fever resolved   Vitals:   Filed Vitals:   02/24/15 0101 02/24/15 0520 02/24/15 0740 02/24/15 0803  BP:  157/85 151/99   Pulse:  85 103   Temp: 99.7 F (37.6 C) 99.5 F (37.5 C) 99.2 F (37.3 C)   TempSrc: Axillary Oral Oral   Resp:  18 18   Height:      Weight:      SpO2:  97% 97% 95%    Wt Readings from Last 3 Encounters:  02/20/15 88.451 kg (195 lb)  02/12/15 86.183 kg (190 lb)  02/10/15 86.183 kg (190 lb)     Intake/Output Summary (Last 24 hours) at 02/24/15 1121 Last data filed at 02/24/15 0513  Gross per 24 hour  Intake    320 ml  Output   1575 ml  Net  -1255 ml    Physical Exam:   GENERAL: Chronically ill-appearing HEAD, EYES, EARS, NOSE AND THROAT: Atraumatic, normocephalic. . Pupils equal and reactive to light. Sclerae anicteric. No conjunctival injection. No oro-pharyngeal erythema.  NECK: Supple. There is no jugular venous distention. No bruits, no lymphadenopathy, no thyromegaly.  HEART: Regular rate and rhythm,. No murmurs, no rubs, no clicks.  LUNGS: Diminished breath rales rhonchi wheezing ABDOMEN: Soft, flat, nontender, nondistended. Has good bowel sounds. No hepatosplenomegaly appreciated.  EXTREMITIES: No evidence of any cyanosis, clubbing, or peripheral edema.  +2 pedal and radial pulses bilaterally.  NEUROLOGIC: Limited exam due to  patient unable to follow commands SKIN: Moist and warm with no rashes appreciated.  Psych: Anxious to depressed LN: No inguinal LN enlargement    Antibiotics   Anti-infectives    Start     Dose/Rate Route Frequency Ordered Stop   02/23/15 1400  clindamycin (CLEOCIN) IVPB 300 mg     300 mg 100 mL/hr over 30 Minutes Intravenous 3 times per day 02/23/15 1233     02/23/15 1300  cefTRIAXone (ROCEPHIN) 1 g in dextrose 5 % 50  mL IVPB     1 g 100 mL/hr over 30 Minutes Intravenous Every 24 hours 02/23/15 1233     02/21/15 2130  vancomycin (VANCOCIN) 1,250 mg in sodium chloride 0.9 % 250 mL IVPB  Status:  Discontinued     1,250 mg 166.7 mL/hr over 90 Minutes Intravenous Every 18 hours 02/21/15 1224 02/21/15 1239   02/21/15 1930  vancomycin (VANCOCIN) IVPB 750 mg/150 ml premix  Status:  Discontinued     750 mg 150 mL/hr over 60 Minutes Intravenous Every 12 hours 02/21/15 1239 02/23/15 1232   02/21/15 1300  meropenem (MERREM) 1 g in sodium chloride 0.9 % 100 mL IVPB  Status:  Discontinued     1 g 200 mL/hr over 30 Minutes Intravenous Every 12 hours 02/21/15 1208 02/23/15 1232   02/21/15 1245  vancomycin (VANCOCIN) IVPB 1000 mg/200 mL premix     1,000 mg 200 mL/hr over 60 Minutes Intravenous  Once 02/21/15 1239 02/21/15 1443   02/21/15 1230  vancomycin (VANCOCIN) 1,500 mg in sodium chloride 0.9 % 500 mL IVPB  Status:  Discontinued     1,500 mg 250 mL/hr over 120 Minutes Intravenous  Once 02/21/15 1224 02/21/15 1239   02/20/15 2200  piperacillin-tazobactam (ZOSYN) IVPB 3.375 g  Status:  Discontinued     3.375 g 12.5 mL/hr over 240 Minutes Intravenous 3 times per day 02/20/15 2024 02/21/15 1208   02/20/15 1515  levofloxacin (LEVAQUIN) IVPB 500 mg     500 mg 100 mL/hr over 60 Minutes Intravenous  Once 02/20/15 1511 02/20/15 1610      Medications   Scheduled Meds: . amLODipine  10 mg Oral Daily  . atorvastatin  80 mg Oral QHS  . calcium-vitamin D  1 tablet Oral BID  . carbamide peroxide   2 drop Both Ears Once per day on Mon Thu  . cefTRIAXone (ROCEPHIN)  IV  1 g Intravenous Q24H  . clindamycin (CLEOCIN) IV  300 mg Intravenous 3 times per day  . furosemide  20 mg Intravenous Q12H  . gabapentin  300 mg Oral TID  . insulin aspart  0-5 Units Subcutaneous QHS  . insulin aspart  0-9 Units Subcutaneous TID WC  . insulin glargine  43 Units Subcutaneous Daily  . ipratropium-albuterol  3 mL Nebulization TID  . losartan  100 mg Oral Daily  . metoprolol  50 mg Oral BID  . multivitamin-lutein  1 capsule Oral BID  . nystatin   Topical BID  . polyethylene glycol  17 g Oral BID  . psyllium  1 packet Oral TID  . QUEtiapine  50 mg Oral QHS  . traZODone  150 mg Oral QHS  . vitamin B-12  1,000 mcg Oral Daily  . Warfarin - Pharmacist Dosing Inpatient   Does not apply q1800   Continuous Infusions:   PRN Meds:.acetaminophen **OR** acetaminophen, alum & mag hydroxide-simeth, bisacodyl, HYDROcodone-acetaminophen, HYDROmorphone (DILAUDID) injection, ibuprofen, ondansetron **OR** ondansetron (ZOFRAN) IV, senna-docusate   Data Review:   Micro Results Recent Results (from the past 240 hour(s))  Culture, blood (routine x 2)     Status: None (Preliminary result)   Collection Time: 02/20/15  2:39 PM  Result Value Ref Range Status   Specimen Description BLOOD RIGHT HAND  Final   Special Requests BOTTLES DRAWN AEROBIC AND ANAEROBIC  1CC  Final   Culture NO GROWTH 4 DAYS  Final   Report Status PENDING  Incomplete  Culture, blood (routine x 2)     Status: None (Preliminary result)  Collection Time: 02/20/15  2:39 PM  Result Value Ref Range Status   Specimen Description BLOOD LEFT ANTECUBITAL  Final   Special Requests BOTTLES DRAWN AEROBIC AND ANAEROBIC  1CC  Final   Culture NO GROWTH 4 DAYS  Final   Report Status PENDING  Incomplete  Urine culture     Status: None   Collection Time: 02/20/15  4:00 PM  Result Value Ref Range Status   Specimen Description URINE, RANDOM  Final   Special  Requests NONE  Final   Culture >=100,000 COLONIES/mL PROTEUS MIRABILIS  Final   Report Status 02/22/2015 FINAL  Final   Organism ID, Bacteria PROTEUS MIRABILIS  Final      Susceptibility   Proteus mirabilis - MIC*    AMPICILLIN >=32 RESISTANT Resistant     CEFAZOLIN 8 SENSITIVE Sensitive     CEFTRIAXONE <=1 SENSITIVE Sensitive     CIPROFLOXACIN >=4 RESISTANT Resistant     GENTAMICIN >=16 RESISTANT Resistant     IMIPENEM 2 SENSITIVE Sensitive     NITROFURANTOIN 128 RESISTANT Resistant     TRIMETH/SULFA >=320 RESISTANT Resistant     AMPICILLIN/SULBACTAM >=32 RESISTANT Resistant     PIP/TAZO <=4 SENSITIVE Sensitive     * >=100,000 COLONIES/mL PROTEUS MIRABILIS  Rapid Influenza A&B Antigens ([facility] only)     Status: None   Collection Time: 02/20/15  5:20 PM  Result Value Ref Range Status   Influenza A ([place]) NEGATIVE  Final   Influenza B ([facility]) NEGATIVE  Final  MRSA PCR Screening     Status: None   Collection Time: 02/20/15  8:55 PM  Result Value Ref Range Status   MRSA by PCR NEGATIVE NEGATIVE Final    Comment:        The GeneXpert MRSA Assay (FDA approved for NASAL specimens only), is one component of a comprehensive MRSA colonization surveillance program. It is not intended to diagnose MRSA infection nor to guide or monitor treatment for MRSA infections.     Radiology Reports Dg Chest 1 View  02/24/2015  CLINICAL DATA:  Short of breath EXAM: CHEST 1 VIEW COMPARISON:  02/20/2015 FINDINGS: Cardiac enlargement. Progression of mild interstitial edema. No pleural effusion. Mild bibasilar atelectasis. Right rib fractures noted. IMPRESSION: Progression of mild bilateral airspace disease consistent with interstitial edema. Electronically Signed   By: Franchot Gallo M.D.   On: 02/24/2015 07:15   Dg Chest 1 View  02/20/2015  CLINICAL DATA:  Mildly productive cough and wheezing for the past 2 days ; EXAM: CHEST 1 VIEW PA with one view lateral chest x-ray. The images are duplicated in  PACs. The PA was performed at the [facility] outpatient facility but the patient then faint it and was transported to a [facility] where the lateral was performed. COMPARISON:  PA and lateral chest x-ray of November 05, 2014 FINDINGS: The lungs are adequately inflated. There is mildly increased density in the left lower lobe posteriorly. There is no pleural effusion. The heart is top-normal in size. The pulmonary vascularity is normal. The 7 sternal wires are intact. 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 There is calcification in the wall of the thoracic aorta. The bony thorax exhibits no acute abnormality. IMPRESSION: Subsegmental atelectasis or early pneumonia in the left lower lobe. There is no evidence of pulmonary edema. Followup PA and lateral chest X-ray is recommended in 3-4 weeks following trial of antibiotic therapy to ensure resolution and exclude underlying malignancy. Electronically Signed   By: David  Martinique M.D.  On: 02/20/2015 12:05   Dg Chest 1 View  02/20/2015  CLINICAL DATA:  Mildly productive cough and wheezing for the past 2 days ; EXAM: CHEST 1 VIEW PA with one view lateral chest x-ray. The images are duplicated in PACs. The PA was performed at the [facility] outpatient facility but the patient then faint it and was transported to a [facility] where the lateral was performed. COMPARISON:  PA and lateral chest x-ray of November 05, 2014 FINDINGS: The lungs are adequately inflated. There is mildly increased density in the left lower lobe posteriorly. There is no pleural effusion. The heart is top-normal in size. The pulmonary vascularity is normal. The 7 sternal wires are intact. 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 There is calcification in the wall of the thoracic aorta. The bony thorax exhibits no acute abnormality. IMPRESSION: Subsegmental atelectasis or early pneumonia in the left lower lobe. There is no evidence of pulmonary edema. Followup PA and lateral chest X-ray is recommended in  3-4 weeks following trial of antibiotic therapy to ensure resolution and exclude underlying malignancy. Electronically Signed   By: David  Martinique M.D.   On: 02/20/2015 12:05   Dg Ribs Unilateral Right  02/20/2015  CLINICAL DATA:  Right-sided rib pain following fall, initial encounter EXAM: RIGHT RIBS - 2 VIEW COMPARISON:  None. FINDINGS: Mildly displaced fractures of the eighth, ninth and tenth ribs are noted on the right posteriorly. No other fractures are seen. No underlying pneumothorax is noted. IMPRESSION: Multiple rib fractures posteriorly on the right consistent with the recent injury. No pneumothorax noted. Electronically Signed   By: Inez Catalina M.D.   On: 02/20/2015 12:32   Ct Head Wo Contrast  02/20/2015  CLINICAL DATA:  Fall today, on Coumadin.  History of stroke. EXAM: CT HEAD WITHOUT CONTRAST TECHNIQUE: Contiguous axial images were obtained from the base of the skull through the vertex without intravenous contrast. COMPARISON:  08/03/2013 FINDINGS: There is atrophy and chronic small vessel disease changes. Old right occipital infarct. No acute intracranial abnormality. Specifically, no hemorrhage, hydrocephalus, mass lesion, acute infarction, or significant intracranial injury. No acute calvarial abnormality. Visualized paranasal sinuses and mastoids clear. Orbital soft tissues unremarkable. IMPRESSION: Old right occipital infarct. No acute intracranial abnormality. Atrophy, chronic microvascular disease. Electronically Signed   By: Rolm Baptise M.D.   On: 02/20/2015 16:46   Ct Chest W Contrast  02/24/2015  CLINICAL DATA:  Follow-up chest x-ray.  Short breath EXAM: CT CHEST WITH CONTRAST TECHNIQUE: Multidetector CT imaging of the chest was performed during intravenous contrast administration. CONTRAST:  2mL OMNIPAQUE IOHEXOL 300 MG/ML  SOLN COMPARISON:  Chest CT 08/10/2011 FINDINGS: Mediastinum/Nodes: No axillary supraclavicular lymphadenopathy. No axillary adenopathy. The thyroid gland is  enlarged coarse calcifications. Mild mediastinal hilar adenopathy is present. 12 mm RIGHT lower paratracheal lymph node for example. 10 mm RIGHT hilar lymph node on image 26 series Lungs/Pleura: Small bilateral pleural effusions with passive atelectasis in the lower lobes. No discrete nodularity. No airspace disease. No pneumothorax. Upper abdomen: Limited view of the liver, kidneys, pancreas are unremarkable. Normal adrenal glands. Musculoskeletal: No aggressive osseous lesion. There is fractures of the posterior lateral sixth through tenth ribs. Several the lower lower ribs fractures are mildly displaced (image 44, series). No pneumothorax with IMPRESSION: 1. Multiple Posterior RIGHT rib fractures (sixth through tenth ribs). No pneumothorax 2. Mild mediastinal adenopathy likely reactive. 3. Small bilateral pleural effusions and passive atelectasis. 4. Nodular from thyroid gland likely represents a benign goiter Electronically Signed   By:  Suzy Bouchard M.D.   On: 02/24/2015 09:48   Dg Chest Port 1 View  02/20/2015  CLINICAL DATA:  Status post fall today with multiple right rib fractures. Initial encounter. EXAM: PORTABLE CHEST 1 VIEW COMPARISON:  PA and lateral chest and dedicated plain films of the ribs earlier today. FINDINGS: There is no pneumothorax. The lungs are clear. Heart size is enlarged. Right rib fractures are not as well seen on this study as on dedicated plain films of the ribs. IMPRESSION: Negative for pneumothorax. Right rib fractures are better demonstrated on plain films earlier today. Cardiomegaly. Electronically Signed   By: Inge Rise M.D.   On: 02/20/2015 17:47     CBC  Recent Labs Lab 02/20/15 1439 02/21/15 0353 02/22/15 0335 02/23/15 0408 02/24/15 0849  WBC 14.1* 9.8 9.7 10.3 12.9*  HGB 12.4* 10.6* 10.6* 10.8* 10.8*  HCT 37.2* 31.2* 31.5* 32.0* 32.8*  PLT 185 162 173 193 221  MCV 83.8 83.8 85.5 84.4 84.1  MCH 27.9 28.6 28.7 28.4 27.7  MCHC 33.3 34.1 33.5 33.7  33.0  RDW 15.3* 15.5* 15.2* 15.3* 15.1*  LYMPHSABS 0.6*  --   --   --   --   MONOABS 1.3*  --   --   --   --   EOSABS 0.0  --   --   --   --   BASOSABS 0.0  --   --   --   --     Chemistries   Recent Labs Lab 02/20/15 1551 02/21/15 0353 02/22/15 0335 02/23/15 0408 02/24/15 0849  NA 128* 130* 135 138 133*  K 4.3 4.1 4.0 4.1 3.8  CL 96* 99* 102 105 103  CO2 24 26 25 27 26   GLUCOSE 191* 153* 169* 127* 141*  BUN 39* 34* 31* 28* 24*  CREATININE 1.70* 1.57* 1.62* 1.22 1.26*  CALCIUM 8.3* 8.2* 8.3* 8.2* 7.8*  AST 23  --   --   --   --   ALT 19  --   --   --   --   ALKPHOS 76  --   --   --   --   BILITOT 1.1  --   --   --   --    ------------------------------------------------------------------------------------------------------------------ estimated creatinine clearance is 55.9 mL/min (by C-G formula based on Cr of 1.26). ------------------------------------------------------------------------------------------------------------------ No results for input(s): HGBA1C in the last 72 hours. ------------------------------------------------------------------------------------------------------------------ No results for input(s): CHOL, HDL, LDLCALC, TRIG, CHOLHDL, LDLDIRECT in the last 72 hours. ------------------------------------------------------------------------------------------------------------------ No results for input(s): TSH, T4TOTAL, T3FREE, THYROIDAB in the last 72 hours.  Invalid input(s): FREET3 ------------------------------------------------------------------------------------------------------------------ No results for input(s): VITAMINB12, FOLATE, FERRITIN, TIBC, IRON, RETICCTPCT in the last 72 hours.  Coagulation profile  Recent Labs Lab 02/20/15 1439 02/23/15 0408  INR 3.00 5.59*    No results for input(s): DDIMER in the last 72 hours.  Cardiac Enzymes No results for input(s): CKMB, TROPONINI, MYOGLOBIN in the last 168 hours.  Invalid input(s):  CK ------------------------------------------------------------------------------------------------------------------ Invalid input(s): POCBNP    Assessment & Plan   75 year male with dementia and atrial fibrillation on anticoagulation who initially presented for an outpatient chest x-ray for cough and subsequently fell due to weakness and now separated rib fracture.  1. Aspiration pneumonia   Patient also has a urinary tract infection with Proteus  His urine cultures sensitive to ceftriaxone I will change him to ceftriaxone and clindamycin for aspiration Seen by speech Repeat chest x-ray shows possible congestive heart failure, is not  very accurate I will obtain a CT scan of the chest 2. Rib fracture: Incentive spirometry if patient able to do 3. Dementia: Observe patient for signs of delirium. continue Seroquel and trazodone  4. Diabetes: Continue sliding scale insulin  5. Atrial fibrillation -INR elevated hold Coumadin  repeat an INR is not available we'll order stat 6. Essential hypertension: Continue metoprolol, Norvasc and losartan   Patient was DO NOT RESUSCITATE his wife changed him to full code I will ask palliative care team to currently full code  Code Status History    Date Active Date Inactive Code Status Order ID Comments User Context   02/20/2015  8:18 PM 02/20/2015  8:19 PM DNR [id number]  Bettey Costa, MD Inpatient   02/20/2015  7:55 PM 02/20/2015  8:18 PM Full Code [id number]  Bettey Costa, MD ED   12/12/2014  9:38 AM 12/13/2014  3:23 AM Full Code [id number]  Inez Catalina, MD HOV    Questions for Most Recent Historical Code Status (Order [id number])    Question Answer Comment   In the event of cardiac or respiratory ARREST Do not call a "code blue"    In the event of cardiac or respiratory ARREST Do not perform Intubation, CPR, defibrillation or ACLS    In the event of cardiac or respiratory ARREST Use medication by any route, position, wound care, and other measures to relive  pain and suffering. May use oxygen, suction and manual treatment of airway obstruction as needed for comfort.     Advance Directive Documentation        Most Recent Value   Type of Advance Directive  Healthcare Power of Attorney   Pre-existing out of facility DNR order (yellow form or pink MOST form)     "MOST" Form in Place?             Consults  none  DVT Prophylaxis  coumadin  Lab Results  Component Value Date   PLT 221 02/24/2015     Time Spent in minutes  52min   Gilbert Manolis M.D on 02/24/2015 at 11:21 AM  Between 7am to 6pm - Pager - [phone]  After 6pm go to [email or web address] - password [email or web address] [facility]   Office  [phone]

## 2015-02-24 NOTE — Progress Notes (Signed)
PT Cancellation Note  Patient Details Name: Bobby Jacobs MRN: [id number] DOB: 1940-03-24   Cancelled Treatment:    Reason Eval/Treat Not Completed: Medical issues which prohibited therapy. Pt currently with increasing INR to 8.2. Pt is no appropriate for evaluation at this time. Will re-attempt next date, pending improving medical status.   Lameisha Schuenemann 02/24/2015, 11:35 AM  Greggory Stallion, PT, DPT [phone]

## 2015-02-24 NOTE — Consult Note (Signed)
ANTICOAGULATION CONSULT NOTE - Initial Consult  Pharmacy Consult for warfarin Indication: atrial fibrillation  No Known Allergies  Patient Measurements: Height: 5\' 10"  (177.8 cm) Weight: 195 lb (88.451 kg) IBW/kg (Calculated) : 73  Vital Signs: Temp: 99.2 F (37.3 C) (01/31 0740) Temp Source: Oral (01/31 0740) BP: 151/99 mmHg (01/31 0740) Pulse Rate: 103 (01/31 0740)  Labs:  Recent Labs  02/22/15 0335 02/23/15 0408 02/24/15 0849  HGB 10.6* 10.8* 10.8*  HCT 31.5* 32.0* 32.8*  PLT 173 193 221  LABPROT  --  48.9* 65.2*  INR  --  5.59* 8.20*  CREATININE 1.62* 1.22 1.26*    Estimated Creatinine Clearance: 55.9 mL/min (by C-G formula based on Cr of 1.26).   Medical History: Past Medical History  Diagnosis Date  . Hypertension   . Coronary artery disease   . Depression   . GERD (gastroesophageal reflux disease)   . Cardiovascular disease   . High cholesterol   . Pneumonia   . Fracture   . Obesity   . Sleep apnea   . CHF (congestive heart failure) ([place])   . Dementia   . Atrial fibrillation ([place])   . Urinary retention   . Urine frequency   . Incomplete bladder emptying   . Stroke ([place])   . Peripheral vascular disease ([place])   . Aortic aneurysm ([place])   . Aortic valve replaced   . Hyperthyroidism   . Diabetes mellitus, type 2 ([place])   . Skin cancer     Medications:  Scheduled:  . amLODipine  10 mg Oral Daily  . atorvastatin  80 mg Oral QHS  . calcium-vitamin D  1 tablet Oral BID  . carbamide peroxide  2 drop Both Ears Once per day on Mon Thu  . cefTRIAXone (ROCEPHIN)  IV  1 g Intravenous Q24H  . clindamycin (CLEOCIN) IV  300 mg Intravenous 3 times per day  . furosemide  20 mg Intravenous Q12H  . gabapentin  300 mg Oral TID  . insulin aspart  0-5 Units Subcutaneous QHS  . insulin aspart  0-9 Units Subcutaneous TID WC  . insulin glargine  43 Units Subcutaneous Daily  . ipratropium-albuterol  3 mL Nebulization TID  . losartan  100 mg Oral Daily  .  metoprolol  50 mg Oral BID  . multivitamin-lutein  1 capsule Oral BID  . nystatin   Topical BID  . polyethylene glycol  17 g Oral BID  . psyllium  1 packet Oral TID  . QUEtiapine  50 mg Oral QHS  . traZODone  150 mg Oral QHS  . vitamin B-12  1,000 mcg Oral Daily  . Warfarin - Pharmacist Dosing Inpatient   Does not apply q1800   Assessment: Bobby Jacobs is a 75yo male admitted for weakness and cough. Pt on warfarin as outpatient for Afib. OP regimen is warfarin 7.5mg  PO QHS. Patient's INR on admit was 3.  Goal of Therapy:  INR 2-3 Monitor platelets by anticoagulation protocol: Yes   Plan:  1/27 admit INR 3 1/30 INR 5.59. Supratherapeutic. Hold dose and recheck INR with AM labs. 1/31 INR 8.2. Supratherapeutic. Patient received 10mg  IV Vitamin K x1 at 1130 this morning. Follow up and recheck INR with AM labs.  Thank you for including pharmacy in this patient's care. Pharmacy will continue to monitor.  Roe Coombs, PharmD Pharmacy Resident 02/24/2015

## 2015-02-24 NOTE — Progress Notes (Signed)
I was asked to see patient due to altered mental status. He does seem altered/confused but has no folca neurological symptoms.  Vitals are stableGEN alert to name not place or time LUNGS CTA some scattered rhonchii CVS: rrr no m/g/r ABD: BS+ NT ND EXT mild edema bialtearl NEURO follows commands strength symmetric bilaterally 4/5 Gait not tested No focal defecits But left side of face is slightly asymmetric (unclear if this is new or old)  I/P  75 y/o male with PNA now confused  1. Metabolic encephalopathy: likely from pain meds but obtain head CT r/o CVA. NEURO checks q 4 hours for now  Time 30 minutes

## 2015-02-24 NOTE — Progress Notes (Signed)
Results for [name], Bobby Jacobs (MRN [id number]) as of 02/24/2015 16:57  Ref. Range 02/24/2015 16:14  Sample type Unknown ARTERIAL DRAW  Delivery systems Unknown NASAL CANNULA  FIO2 Unknown 0.32  pH, Arterial Latest Ref Range: 7.350-7.450  7.51 (H)  pCO2 arterial Latest Ref Range: 32.0-48.0 mmHg 36  pO2, Arterial Latest Ref Range: 83.0-108.0 mmHg 70 (L)  Bicarbonate Latest Ref Range: 21.0-28.0 mEq/L 28.7 (H)  Acid-Base Excess Latest Ref Range: 0.0-3.0 mmol/L 5.5 (H)  O2 Saturation Latest Units: % 95.4  Patient temperature Unknown 37.0

## 2015-02-24 NOTE — Progress Notes (Signed)
Notified MD of no code status in system. Per conversation with pt's wife, patient is DNR per living will prior to admission, but wife requests pt to be a full code. Spoke with MD, he has discussed this with pt's wife and will take care of it. MD will contact pt's wife.

## 2015-02-25 LAB — PROTIME-INR
INR: 1.55
PROTHROMBIN TIME: 18.6 s — AB (ref 11.4–15.0)

## 2015-02-25 LAB — URINALYSIS COMPLETE WITH MICROSCOPIC (ARMC ONLY)
BILIRUBIN URINE: NEGATIVE
GLUCOSE, UA: 150 mg/dL — AB
Ketones, ur: NEGATIVE mg/dL
NITRITE: NEGATIVE
Protein, ur: NEGATIVE mg/dL
SPECIFIC GRAVITY, URINE: 1.016 (ref 1.005–1.030)
pH: 6 (ref 5.0–8.0)

## 2015-02-25 LAB — CBC WITH DIFFERENTIAL/PLATELET
BASOS ABS: 0.1 10*3/uL (ref 0–0.1)
BASOS PCT: 1 %
EOS ABS: 0.2 10*3/uL (ref 0–0.7)
EOS PCT: 1 %
HCT: 32.8 % — ABNORMAL LOW (ref 40.0–52.0)
Hemoglobin: 10.8 g/dL — ABNORMAL LOW (ref 13.0–18.0)
LYMPHS PCT: 7 %
Lymphs Abs: 0.8 10*3/uL — ABNORMAL LOW (ref 1.0–3.6)
MCH: 27.9 pg (ref 26.0–34.0)
MCHC: 32.7 g/dL (ref 32.0–36.0)
MCV: 85.2 fL (ref 80.0–100.0)
Monocytes Absolute: 1 10*3/uL (ref 0.2–1.0)
Monocytes Relative: 8 %
Neutro Abs: 10.1 10*3/uL — ABNORMAL HIGH (ref 1.4–6.5)
Neutrophils Relative %: 83 %
Platelets: 261 10*3/uL (ref 150–440)
RBC: 3.86 MIL/uL — AB (ref 4.40–5.90)
RDW: 15.4 % — AB (ref 11.5–14.5)
WBC: 12.1 10*3/uL — AB (ref 3.8–10.6)

## 2015-02-25 LAB — BASIC METABOLIC PANEL
ANION GAP: 8 (ref 5–15)
BUN: 29 mg/dL — ABNORMAL HIGH (ref 6–20)
CALCIUM: 8.3 mg/dL — AB (ref 8.9–10.3)
CO2: 28 mmol/L (ref 22–32)
Chloride: 101 mmol/L (ref 101–111)
Creatinine, Ser: 1.28 mg/dL — ABNORMAL HIGH (ref 0.61–1.24)
GFR, EST NON AFRICAN AMERICAN: 53 mL/min — AB (ref >60)
Glucose, Bld: 315 mg/dL — ABNORMAL HIGH (ref 65–99)
POTASSIUM: 4 mmol/L (ref 3.5–5.1)
SODIUM: 137 mmol/L (ref 135–145)

## 2015-02-25 LAB — GLUCOSE, CAPILLARY
GLUCOSE-CAPILLARY: 321 mg/dL — AB (ref 65–99)
GLUCOSE-CAPILLARY: 282 mg/dL — AB (ref 65–99)
Glucose-Capillary: 259 mg/dL — ABNORMAL HIGH (ref 65–99)

## 2015-02-25 MED ORDER — WARFARIN SODIUM 5 MG PO TABS
7.5000 mg | ORAL_TABLET | Freq: Once | ORAL | Status: AC
  Administered 2015-02-25: 7.5 mg via ORAL
  Filled 2015-02-25: qty 2

## 2015-02-25 MED ORDER — METOPROLOL TARTRATE 50 MG PO TABS
75.0000 mg | ORAL_TABLET | Freq: Two times a day (BID) | ORAL | Status: DC
  Administered 2015-02-25 – 2015-03-02 (×10): 75 mg via ORAL
  Filled 2015-02-25 (×10): qty 1

## 2015-02-25 MED ORDER — INSULIN GLARGINE 100 UNIT/ML ~~LOC~~ SOLN
50.0000 [IU] | Freq: Every day | SUBCUTANEOUS | Status: DC
  Administered 2015-02-26 – 2015-02-27 (×2): 50 [IU] via SUBCUTANEOUS
  Filled 2015-02-25 (×3): qty 0.5

## 2015-02-25 NOTE — Consult Note (Addendum)
[facility] Infectious Disease     Reason for Consult: Recurrent fevers, UTI    Referring Physician: Dustin Flock Date of Admission:  02/20/2015   Active Problems:   Pneumonia   HPI: [name] is a 75 y.o. male with Dementia, DM admitted with cough, and wheezing. He had CXR done showing LLL pna  (had rib fx from fall while getting xray).   He has a chronic suprapubic cath and has had recurrent UTIs. On admit had fevers to 101.9, wbc 14.  Started on levo and zosyn. Vanco added 1/28. Changed to  ctx 1/30. He has recurrent fevers despite abx. He also has recurrent confusion and tachycardia without etiology. Per his family and caregiver he had an episode of turning pale and bradycardia yesterday when he had CPAP on and likely had some obstruction. This has happended in the past  Micro UCX with proteus, UA with TNTC wbc on admit. BCX negative 1/27 and 2/1. Flu negative.  Had CT neg for infiltrate  Past Medical History  Diagnosis Date  . Hypertension   . Coronary artery disease   . Depression   . GERD (gastroesophageal reflux disease)   . Cardiovascular disease   . High cholesterol   . Pneumonia   . Fracture   . Obesity   . Sleep apnea   . CHF (congestive heart failure) ([place])   . Dementia   . Atrial fibrillation ([place])   . Urinary retention   . Urine frequency   . Incomplete bladder emptying   . Stroke ([place])   . Peripheral vascular disease ([place])   . Aortic aneurysm ([place])   . Aortic valve replaced   . Hyperthyroidism   . Diabetes mellitus, type 2 ([place])   . Skin cancer    Past Surgical History  Procedure Laterality Date  . Hernia repair    . Cardiac surgery    . Neck surgery    . Degenerative disk disease    . Circumcision    . Coronary artery bypass graft    . Suprapubic catheter surgery  09/22/2014    placement   Social History  Substance Use Topics  . Smoking status: Former Research scientist (life sciences)  . Smokeless tobacco: Former Systems developer    Quit date: 09/21/2012      Comment: quit one year ago  . Alcohol Use: No   Family History  Problem Relation Age of Onset  . Cancer Neg Hx     Kidney,Bladder ,Prostate    Allergies: No Known Allergies  Current antibiotics: Antibiotics Given (last 72 hours)    Date/Time Action Medication Dose Rate   02/22/15 1948 Given   vancomycin (VANCOCIN) IVPB 750 mg/150 ml premix 750 mg 150 mL/hr   02/23/15 0147 Given   meropenem (MERREM) 1 g in sodium chloride 0.9 % 100 mL IVPB 1 g 200 mL/hr   02/23/15 0654 Given   vancomycin (VANCOCIN) IVPB 750 mg/150 ml premix 750 mg 150 mL/hr   02/23/15 1413 Given   cefTRIAXone (ROCEPHIN) 1 g in dextrose 5 % 50 mL IVPB 1 g 100 mL/hr   02/23/15 1458 Given   clindamycin (CLEOCIN) IVPB 300 mg 300 mg 100 mL/hr   02/23/15 2109 Given   clindamycin (CLEOCIN) IVPB 300 mg 300 mg 100 mL/hr   02/24/15 0513 Given   clindamycin (CLEOCIN) IVPB 300 mg 300 mg 100 mL/hr   02/24/15 2058 Given   cefTRIAXone (ROCEPHIN) 1 g in dextrose 5 % 50 mL IVPB 1 g 100 mL/hr   02/25/15  1245 Given   cefTRIAXone (ROCEPHIN) 1 g in dextrose 5 % 50 mL IVPB 1 g 100 mL/hr      MEDICATIONS: . amLODipine  10 mg Oral Daily  . atorvastatin  80 mg Oral QHS  . calcium-vitamin D  1 tablet Oral BID  . carbamide peroxide  2 drop Both Ears Once per day on Mon Thu  . cefTRIAXone (ROCEPHIN)  IV  1 g Intravenous Q24H  . furosemide  20 mg Intravenous Q12H  . insulin aspart  0-5 Units Subcutaneous QHS  . insulin aspart  0-9 Units Subcutaneous TID WC  . [START ON 02/26/2015] insulin glargine  50 Units Subcutaneous Daily  . ipratropium-albuterol  3 mL Nebulization TID  . losartan  100 mg Oral Daily  . metoprolol  75 mg Oral BID  . multivitamin-lutein  1 capsule Oral BID  . nystatin   Topical BID  . polyethylene glycol  17 g Oral BID  . psyllium  1 packet Oral TID  . QUEtiapine  50 mg Oral QHS  . traZODone  150 mg Oral QHS  . vitamin B-12  1,000 mcg Oral Daily  . warfarin  7.5 mg Oral ONCE-1800  . Warfarin - Pharmacist  Dosing Inpatient   Does not apply q1800    Review of Systems - 11 systems reviewed and negative per HPI   OBJECTIVE: Temp:  [98.8 F (37.1 C)-101.3 F (38.5 C)] 98.8 F (37.1 C) (02/01 1517) Pulse Rate:  [93-111] 111 (02/01 1517) Resp:  [18-20] 20 (02/01 1517) BP: (121-155)/(63-85) 150/85 mmHg (02/01 1517) SpO2:  [97 %-99 %] 99 % (02/01 1517) Physical Exam  Constitutional: frail, chronically ill appearing HENT: anicteric Mouth/Throat: Oropharynx is clear and dry. No oropharyngeal exudate.  Cardiovascular: Normal rate, regular rhythm and normal heart sounds. Pulmonary/Chest: Effort normal and breath sounds normal. No respiratory distress. He has no wheezes.  Abdominal: Soft. Bowel sounds are normal. He exhibits no distension. There is no tenderness.  SP cath site wnl Lymphadenopathy: He has no cervical adenopathy.  Neurological: alert, flat facies, + cog wheeling Skin: Skin is warm and dry. No rash noted. No erythema.  Psychiatric: He has a normal mood and affect. His behavior is normal.    LABS: Results for orders placed or performed during the hospital encounter of 02/20/15 (from the past 48 hour(s))  Glucose, capillary     Status: Abnormal   Collection Time: 02/23/15  8:47 PM  Result Value Ref Range   Glucose-Capillary 216 (H) 65 - 99 mg/dL  Glucose, capillary     Status: Abnormal   Collection Time: 02/24/15  7:38 AM  Result Value Ref Range   Glucose-Capillary 122 (H) 65 - 99 mg/dL  CBC     Status: Abnormal   Collection Time: 02/24/15  8:49 AM  Result Value Ref Range   WBC 12.9 (H) 3.8 - 10.6 K/uL   RBC 3.90 (L) 4.40 - 5.90 MIL/uL   Hemoglobin 10.8 (L) 13.0 - 18.0 g/dL   HCT 32.8 (L) 40.0 - 52.0 %   MCV 84.1 80.0 - 100.0 fL   MCH 27.7 26.0 - 34.0 pg   MCHC 33.0 32.0 - 36.0 g/dL   RDW 15.1 (H) 11.5 - 14.5 %   Platelets 221 150 - 440 K/uL  Basic metabolic panel     Status: Abnormal   Collection Time: 02/24/15  8:49 AM  Result Value Ref Range   Sodium 133 (L) 135  - 145 mmol/L   Potassium 3.8 3.5 - 5.1 mmol/L  Chloride 103 101 - 111 mmol/L   CO2 26 22 - 32 mmol/L   Glucose, Bld 141 (H) 65 - 99 mg/dL   BUN 24 (H) 6 - 20 mg/dL   Creatinine, Ser 1.26 (H) 0.61 - 1.24 mg/dL   Calcium 7.8 (L) 8.9 - 10.3 mg/dL   GFR calc non Af Amer 54 (L) >60 mL/min   GFR calc Af Amer >60 >60 mL/min    Comment: (NOTE) The eGFR has been calculated using the CKD EPI equation. This calculation has not been validated in all clinical situations. eGFR's persistently <60 mL/min signify possible Chronic Kidney Disease.    Anion gap 4 (L) 5 - 15  Protime-INR     Status: Abnormal   Collection Time: 02/24/15  8:49 AM  Result Value Ref Range   Prothrombin Time 65.2 (H) 11.4 - 15.0 seconds   INR 8.20 (HH)     Comment: CRITICAL RESULT CALLED TO, READ BACK BY AND VERIFIED WITH: LESLIE NELSON AT 2595 ON 02/24/15. [facility] RESULT REPEATED AND VERIFIED   Brain natriuretic peptide     Status: Abnormal   Collection Time: 02/24/15  8:49 AM  Result Value Ref Range   B Natriuretic Peptide 263.0 (H) 0.0 - 100.0 pg/mL  Glucose, capillary     Status: Abnormal   Collection Time: 02/24/15 10:23 AM  Result Value Ref Range   Glucose-Capillary 154 (H) 65 - 99 mg/dL  Glucose, capillary     Status: Abnormal   Collection Time: 02/24/15  3:51 PM  Result Value Ref Range   Glucose-Capillary 164 (H) 65 - 99 mg/dL  Blood gas, arterial     Status: Abnormal   Collection Time: 02/24/15  4:14 PM  Result Value Ref Range   FIO2 0.32    Delivery systems NASAL CANNULA    pH, Arterial 7.51 (H) 7.350 - 7.450   pCO2 arterial 36 32.0 - 48.0 mmHg   pO2, Arterial 70 (L) 83.0 - 108.0 mmHg   Bicarbonate 28.7 (H) 21.0 - 28.0 mEq/L   Acid-Base Excess 5.5 (H) 0.0 - 3.0 mmol/L   O2 Saturation 95.4 %   Patient temperature 37.0    Collection site RIGHT RADIAL    Sample type ARTERIAL DRAW    Allens test (pass/fail) POSITIVE (A) PASS  Glucose, capillary     Status: Abnormal   Collection Time: 02/24/15  8:56 PM   Result Value Ref Range   Glucose-Capillary 240 (H) 65 - 99 mg/dL  Protime-INR     Status: Abnormal   Collection Time: 02/25/15  5:43 AM  Result Value Ref Range   Prothrombin Time 18.6 (H) 11.4 - 15.0 seconds   INR 1.55   Glucose, capillary     Status: Abnormal   Collection Time: 02/25/15 10:47 AM  Result Value Ref Range   Glucose-Capillary 321 (H) 65 - 99 mg/dL  CBC with Differential/Platelet     Status: Abnormal   Collection Time: 02/25/15 11:31 AM  Result Value Ref Range   WBC 12.1 (H) 3.8 - 10.6 K/uL   RBC 3.86 (L) 4.40 - 5.90 MIL/uL   Hemoglobin 10.8 (L) 13.0 - 18.0 g/dL   HCT 32.8 (L) 40.0 - 52.0 %   MCV 85.2 80.0 - 100.0 fL   MCH 27.9 26.0 - 34.0 pg   MCHC 32.7 32.0 - 36.0 g/dL   RDW 15.4 (H) 11.5 - 14.5 %   Platelets 261 150 - 440 K/uL   Neutrophils Relative % 83 %   Neutro Abs 10.1 (H)  1.4 - 6.5 K/uL   Lymphocytes Relative 7 %   Lymphs Abs 0.8 (L) 1.0 - 3.6 K/uL   Monocytes Relative 8 %   Monocytes Absolute 1.0 0.2 - 1.0 K/uL   Eosinophils Relative 1 %   Eosinophils Absolute 0.2 0 - 0.7 K/uL   Basophils Relative 1 %   Basophils Absolute 0.1 0 - 0.1 K/uL  Basic metabolic panel     Status: Abnormal   Collection Time: 02/25/15 11:31 AM  Result Value Ref Range   Sodium 137 135 - 145 mmol/L   Potassium 4.0 3.5 - 5.1 mmol/L   Chloride 101 101 - 111 mmol/L   CO2 28 22 - 32 mmol/L   Glucose, Bld 315 (H) 65 - 99 mg/dL   BUN 29 (H) 6 - 20 mg/dL   Creatinine, Ser 1.28 (H) 0.61 - 1.24 mg/dL   Calcium 8.3 (L) 8.9 - 10.3 mg/dL   GFR calc non Af Amer 53 (L) >60 mL/min   GFR calc Af Amer >60 >60 mL/min    Comment: (NOTE) The eGFR has been calculated using the CKD EPI equation. This calculation has not been validated in all clinical situations. eGFR's persistently <60 mL/min signify possible Chronic Kidney Disease.    Anion gap 8 5 - 15  Urinalysis complete, with microscopic ([facility] only)     Status: Abnormal   Collection Time: 02/25/15 12:40 PM  Result Value Ref Range    Color, Urine YELLOW (A) YELLOW   APPearance CLEAR (A) CLEAR   Glucose, UA 150 (A) NEGATIVE mg/dL   Bilirubin Urine NEGATIVE NEGATIVE   Ketones, ur NEGATIVE NEGATIVE mg/dL   Specific Gravity, Urine 1.016 1.005 - 1.030   Hgb urine dipstick 1+ (A) NEGATIVE   pH 6.0 5.0 - 8.0   Protein, ur NEGATIVE NEGATIVE mg/dL   Nitrite NEGATIVE NEGATIVE   Leukocytes, UA 1+ (A) NEGATIVE   RBC / HPF 0-5 0 - 5 RBC/hpf   WBC, UA 6-30 0 - 5 WBC/hpf   Bacteria, UA RARE (A) NONE SEEN   Squamous Epithelial / LPF 0-5 (A) NONE SEEN   Mucous PRESENT    No components found for: ESR, C REACTIVE PROTEIN MICRO: Recent Results (from the past 720 hour(s))  Culture, blood (routine x 2)     Status: None (Preliminary result)   Collection Time: 02/20/15  2:39 PM  Result Value Ref Range Status   Specimen Description BLOOD RIGHT HAND  Final   Special Requests BOTTLES DRAWN AEROBIC AND ANAEROBIC  1CC  Final   Culture NO GROWTH 4 DAYS  Final   Report Status PENDING  Incomplete  Culture, blood (routine x 2)     Status: None (Preliminary result)   Collection Time: 02/20/15  2:39 PM  Result Value Ref Range Status   Specimen Description BLOOD LEFT ANTECUBITAL  Final   Special Requests BOTTLES DRAWN AEROBIC AND ANAEROBIC  1CC  Final   Culture NO GROWTH 4 DAYS  Final   Report Status PENDING  Incomplete  Urine culture     Status: None   Collection Time: 02/20/15  4:00 PM  Result Value Ref Range Status   Specimen Description URINE, RANDOM  Final   Special Requests NONE  Final   Culture >=100,000 COLONIES/mL PROTEUS MIRABILIS  Final   Report Status 02/22/2015 FINAL  Final   Organism ID, Bacteria PROTEUS MIRABILIS  Final      Susceptibility   Proteus mirabilis - MIC*    AMPICILLIN >=32 RESISTANT Resistant  CEFAZOLIN 8 SENSITIVE Sensitive     CEFTRIAXONE <=1 SENSITIVE Sensitive     CIPROFLOXACIN >=4 RESISTANT Resistant     GENTAMICIN >=16 RESISTANT Resistant     IMIPENEM 2 SENSITIVE Sensitive     NITROFURANTOIN  128 RESISTANT Resistant     TRIMETH/SULFA >=320 RESISTANT Resistant     AMPICILLIN/SULBACTAM >=32 RESISTANT Resistant     PIP/TAZO <=4 SENSITIVE Sensitive     * >=100,000 COLONIES/mL PROTEUS MIRABILIS  Rapid Influenza A&B Antigens ([facility] only)     Status: None   Collection Time: 02/20/15  5:20 PM  Result Value Ref Range Status   Influenza A ([place]) NEGATIVE  Final   Influenza B ([facility]) NEGATIVE  Final  MRSA PCR Screening     Status: None   Collection Time: 02/20/15  8:55 PM  Result Value Ref Range Status   MRSA by PCR NEGATIVE NEGATIVE Final    Comment:        The GeneXpert MRSA Assay (FDA approved for NASAL specimens only), is one component of a comprehensive MRSA colonization surveillance program. It is not intended to diagnose MRSA infection nor to guide or monitor treatment for MRSA infections.     IMAGING: Dg Chest 1 View  02/24/2015  CLINICAL DATA:  Short of breath EXAM: CHEST 1 VIEW COMPARISON:  02/20/2015 FINDINGS: Cardiac enlargement. Progression of mild interstitial edema. No pleural effusion. Mild bibasilar atelectasis. Right rib fractures noted. IMPRESSION: Progression of mild bilateral airspace disease consistent with interstitial edema. Electronically Signed   By: Franchot Gallo M.D.   On: 02/24/2015 07:15   Dg Chest 1 View  02/20/2015  CLINICAL DATA:  Mildly productive cough and wheezing for the past 2 days ; EXAM: CHEST 1 VIEW PA with one view lateral chest x-ray. The images are duplicated in PACs. The PA was performed at the [facility] outpatient facility but the patient then faint it and was transported to a [facility] where the lateral was performed. COMPARISON:  PA and lateral chest x-ray of November 05, 2014 FINDINGS: The lungs are adequately inflated. There is mildly increased density in the left lower lobe posteriorly. There is no pleural effusion. The heart is top-normal in size. The pulmonary vascularity is normal. The 7 sternal wires are intact. A prosthetic aortic  valve ring is visible. There is calcification in the wall of the thoracic aorta. The bony thorax exhibits no acute abnormality. IMPRESSION: Subsegmental atelectasis or early pneumonia in the left lower lobe. There is no evidence of pulmonary edema. Followup PA and lateral chest X-ray is recommended in 3-4 weeks following trial of antibiotic therapy to ensure resolution and exclude underlying malignancy. Electronically Signed   By: David  Martinique M.D.   On: 02/20/2015 12:05   Dg Chest 1 View  02/20/2015  CLINICAL DATA:  Mildly productive cough and wheezing for the past 2 days ; EXAM: CHEST 1 VIEW PA with one view lateral chest x-ray. The images are duplicated in PACs. The PA was performed at the [facility] outpatient facility but the patient then faint it and was transported to a [facility] where the lateral was performed. COMPARISON:  PA and lateral chest x-ray of November 05, 2014 FINDINGS: The lungs are adequately inflated. There is mildly increased density in the left lower lobe posteriorly. There is no pleural effusion. The heart is top-normal in size. The pulmonary vascularity is normal. The 7 sternal wires are intact. 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 There is calcification in the wall of the thoracic aorta. The bony  thorax exhibits no acute abnormality. IMPRESSION: Subsegmental atelectasis or early pneumonia in the left lower lobe. There is no evidence of pulmonary edema. Followup PA and lateral chest X-ray is recommended in 3-4 weeks following trial of antibiotic therapy to ensure resolution and exclude underlying malignancy. Electronically Signed   By: David  Martinique M.D.   On: 02/20/2015 12:05   Dg Ribs Unilateral Right  02/20/2015  CLINICAL DATA:  Right-sided rib pain following fall, initial encounter EXAM: RIGHT RIBS - 2 VIEW COMPARISON:  None. FINDINGS: Mildly displaced fractures of the eighth, ninth and tenth ribs are noted on the right posteriorly. No other fractures are seen. No underlying  pneumothorax is noted. IMPRESSION: Multiple rib fractures posteriorly on the right consistent with the recent injury. No pneumothorax noted. Electronically Signed   By: Inez Catalina M.D.   On: 02/20/2015 12:32   Ct Head Wo Contrast  02/24/2015  CLINICAL DATA:  Change in pt status, decreased responses, delayed responses. Pinpoint pupils with sluggish response to light. Irregular pulse wit hx. of a-fib. Cool, clammy skin. BG 164. VS WNL. Pt has been on/off CPaP throughout the day due to drowsiness. EXAM: CT HEAD WITHOUT CONTRAST TECHNIQUE: Contiguous axial images were obtained from the base of the skull through the vertex without intravenous contrast. COMPARISON:  02/20/2015 FINDINGS: The ventricles are enlarged, to a greater degree than the sulci, but stable when compared to the prior exam. Overall atrophy is advanced for age. Encephalomalacia seen along the posterior medial right parietal and adjacent occipital lobe reflecting an old infarct, stable. There is no evidence of a recent cortical infarct. There are no parenchymal masses or mass effect. Periventricular white matter hypoattenuation is noted consistent with mild chronic microvascular ischemic change. There are no extra-axial masses or abnormal fluid collections. There is no intracranial hemorrhage. Visualized sinuses and mastoid air cells are clear. IMPRESSION: 1. No acute intracranial abnormalities. 2. Advanced atrophy.  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 Electronically Signed   By: Lajean Manes M.D.   On: 02/24/2015 17:43   Ct Head Wo Contrast  02/20/2015  CLINICAL DATA:  Fall today, on Coumadin.  History of stroke. EXAM: CT HEAD WITHOUT CONTRAST TECHNIQUE: Contiguous axial images were obtained from the base of the skull through the vertex without intravenous contrast. COMPARISON:  08/03/2013 FINDINGS: There is atrophy and chronic small vessel disease changes. Old right occipital infarct. No acute intracranial abnormality. Specifically, no  hemorrhage, hydrocephalus, mass lesion, acute infarction, or significant intracranial injury. No acute calvarial abnormality. Visualized paranasal sinuses and mastoids clear. Orbital soft tissues unremarkable. IMPRESSION: Old right occipital infarct. No acute intracranial abnormality. Atrophy, chronic microvascular disease. Electronically Signed   By: Rolm Baptise M.D.   On: 02/20/2015 16:46   Ct Chest W Contrast  02/24/2015  CLINICAL DATA:  Follow-up chest x-ray.  Short breath EXAM: CT CHEST WITH CONTRAST TECHNIQUE: Multidetector CT imaging of the chest was performed during intravenous contrast administration. CONTRAST:  16m OMNIPAQUE IOHEXOL 300 MG/ML  SOLN COMPARISON:  Chest CT 08/10/2011 FINDINGS: Mediastinum/Nodes: No axillary supraclavicular lymphadenopathy. No axillary adenopathy. The thyroid gland is enlarged coarse calcifications. Mild mediastinal hilar adenopathy is present. 12 mm RIGHT lower paratracheal lymph node for example. 10 mm RIGHT hilar lymph node on image 26 series Lungs/Pleura: Small bilateral pleural effusions with passive atelectasis in the lower lobes. No discrete nodularity. No airspace disease. No pneumothorax. Upper abdomen: Limited view of the liver, kidneys, pancreas are unremarkable. Normal adrenal glands. Musculoskeletal: No aggressive osseous lesion. There is fractures of the  posterior lateral sixth through tenth ribs. Several the lower lower ribs fractures are mildly displaced (image 44, series). No pneumothorax with IMPRESSION: 1. Multiple Posterior RIGHT rib fractures (sixth through tenth ribs). No pneumothorax 2. Mild mediastinal adenopathy likely reactive. 3. Small bilateral pleural effusions and passive atelectasis. 4. Nodular from thyroid gland likely represents a benign goiter Electronically Signed   By: Suzy Bouchard M.D.   On: 02/24/2015 09:48   Dg Chest Port 1 View  02/20/2015  CLINICAL DATA:  Status post fall today with multiple right rib fractures. Initial  encounter. EXAM: PORTABLE CHEST 1 VIEW COMPARISON:  PA and lateral chest and dedicated plain films of the ribs earlier today. FINDINGS: There is no pneumothorax. The lungs are clear. Heart size is enlarged. Right rib fractures are not as well seen on this study as on dedicated plain films of the ribs. IMPRESSION: Negative for pneumothorax. Right rib fractures are better demonstrated on plain films earlier today. Cardiomegaly. Electronically Signed   By: Inge Rise M.D.   On: 02/20/2015 17:47    Assessment:   Bobby Jacobs is a 75 y.o. male with Dementia, DM admitted with cough, and wheezing. He had CXR done showing LLL pna  (had rib fx from fall while getting xray).   He has a chronic suprapubic cath and has had recurrent UTIs. UA with TNTC wbc on admit and ucx proteus. BCX negative 1/27 and 2/1. Flu negative.  Had CT neg for infiltrate On admit had fevers to 101.9, wbc 14.  Started on levo and zosyn. Vanco added 1/28. Changed to  ctx 1/30. He has recurrent fevers despite abx. He also has recurrent confusion and tachycardia without etiology. May be having aspiration events or could have pyelonephritis as cause of his recurrent fevers  Recommendations Continue ceftraixone for now pending cultures If fevers recur would image his urinary system with either renal USS or CT scan Thank you very much for allowing me to participate in the care of this patient. Please call with questions.   Cheral Marker. Ola Spurr, MD

## 2015-02-25 NOTE — Progress Notes (Signed)
Case discussed with wife, she reports two days ago she reveresed his DNR status but now feels he should be dnr, i will change him to dnr

## 2015-02-25 NOTE — Evaluation (Signed)
Physical Therapy Evaluation Patient Details Name: Bobby Jacobs MRN: [id number] DOB: 1940-08-07 Today's Date: 02/25/2015   History of Present Illness  Pt admitted for pneumonia with acute R rib fracture #6-10 from fall. During OP chest x-[place], pt fell of stool and obtained rib fractures. Pt with history of dementia/diabetes/HTN.  Clinical Impression  Pt is a pleasant 75 year old male who was admitted for pneumonia, but now with acute rib fractures. Pt performed bed mobility with max assist and able to sit at EOB for 1-2 minutes before fatiguing and requesting to return to bed. Pt confused and only oriented to person/place. He is able to follow commands for participation of bed the-ex. Pt demonstrates deficits with strength/endurance/mobility. Would benefit from skilled PT to address above deficits to have optimal return to PLOF; recommend transition to STR upon discharge from acute hospitalization.     Follow Up Recommendations SNF    Equipment Recommendations       Recommendations for Other Services       Precautions / Restrictions Precautions Precautions: Fall Restrictions Weight Bearing Restrictions: No      Mobility  Bed Mobility Overal bed mobility: Needs Assistance Bed Mobility: Supine to Sit     Supine to sit: Max assist     General bed mobility comments: bed mobility attempted with pt able to sit at EOB for approx 1-2 mins. Pt then fatigues and complains of increased pain, assisted back to bed. Pt able to help with B LE, however needs max assist for help with trunk control and scooting out towards EOB.  Transfers                 General transfer comment: unable at this time  Ambulation/Gait                Stairs            Wheelchair Mobility    Modified Rankin (Stroke Patients Only)       Balance Overall balance assessment: History of Falls;Needs assistance Sitting-balance support: Feet unsupported;Bilateral upper extremity  supported Sitting balance-Leahy Scale: Poor                                       Pertinent Vitals/Pain Pain Assessment: Faces Faces Pain Scale: Hurts even more Pain Location: R side with movement Pain Descriptors / Indicators: Aching Pain Intervention(s): Limited activity within patient's tolerance    Home Living Family/patient expects to be discharged to:: Group home                 Additional Comments: from family care home    Prior Function Level of Independence: Needs assistance         Comments: per wife, pt occasionally stood with RW for transfers to bed/WC, however is primarily [place] bound     Hand Dominance        Extremity/Trunk Assessment   Upper Extremity Assessment: Generalized weakness (L (2+/5) weaker compared to R (3/5))           Lower Extremity Assessment: Generalized weakness (L 3/45 vs R 4/5)         Communication   Communication: No difficulties  Cognition Arousal/Alertness: Lethargic Behavior During Therapy: WFL for tasks assessed/performed Overall Cognitive Status: History of cognitive impairments - at baseline  General Comments      Exercises Other Exercises Other Exercises: supine ther-ex performed on B LE including ankle pumps, SLRs, SAQ and hip abd/add. Pt able to perform exercises on L LE with mod assist and R LE with cga. All ther-ex performed x 10 reps with cues for technique. Pt needed multiple rest breaks during ther-ex secondary to pain/fatigue      Assessment/Plan    PT Assessment Patient needs continued PT services  PT Diagnosis Difficulty walking;Abnormality of gait;Generalized weakness;Acute pain   PT Problem List Decreased strength;Decreased activity tolerance;Decreased balance;Decreased mobility;Pain  PT Treatment Interventions Gait training;Therapeutic activities;Therapeutic exercise   PT Goals (Current goals can be found in the Care Plan section) Acute Rehab PT  Goals Patient Stated Goal: to get stronger PT Goal Formulation: With patient Time For Goal Achievement: 03/11/15 Potential to Achieve Goals: Good    Frequency 7X/week   Barriers to discharge        Co-evaluation               End of Session Equipment Utilized During Treatment: Oxygen Activity Tolerance: Patient limited by fatigue;Patient limited by pain Patient left: in bed;with bed alarm set;with family/visitor present Nurse Communication: Mobility status         Time: [id number] PT Time Calculation (min) (ACUTE ONLY): 34 min   Charges:   PT Evaluation $PT Eval Moderate Complexity: 1 Procedure PT Treatments $Therapeutic Exercise: 8-22 mins   PT G Codes:        Roderica Cathell 02-26-15, 2:31 PM Greggory Stallion, PT, DPT [phone]

## 2015-02-25 NOTE — Progress Notes (Signed)
[place] at [facility]                                                                                                                                                                                            Patient Demographics   Bobby Jacobs, is a 75 y.o. male, DOB - 10-19-1940, [id number]  Admit date - 02/20/2015   Admitting Physician Hillary Bow, MD  Outpatient Primary MD for the patient is [name]., PA   LOS - 5  Subjective:   Patient had episode yesterday where he was not responding well. And had hypotension., Now again he starting to have fevers.  Review of Systems:   CONSTITUTIONAL: Positive for fever   Vitals:   Filed Vitals:   02/24/15 2002 02/25/15 0339 02/25/15 0539 02/25/15 0820  BP:  121/63  155/79  Pulse:  99  93  Temp:  101.3 F (38.5 C) 99.1 F (37.3 C) 99.1 F (37.3 C)  TempSrc:  Axillary Axillary Axillary  Resp:  18  18  Height:      Weight:      SpO2: 97% 97%  99%    Wt Readings from Last 3 Encounters:  02/20/15 88.451 kg (195 lb)  02/12/15 86.183 kg (190 lb)  02/10/15 86.183 kg (190 lb)     Intake/Output Summary (Last 24 hours) at 02/25/15 1216 Last data filed at 02/25/15 1147  Gross per 24 hour  Intake    290 ml  Output   2300 ml  Net  -2010 ml    Physical Exam:   GENERAL: Chronically ill-appearing HEAD, EYES, EARS, NOSE AND THROAT: Atraumatic, normocephalic. . Pupils equal and reactive to light. Sclerae anicteric. No conjunctival injection. No oro-pharyngeal erythema.  NECK: Supple. There is no jugular venous distention. No bruits, no lymphadenopathy, no thyromegaly.  HEART: Regular rate and rhythm,. No murmurs, no rubs, no clicks.  LUNGS: Diminished breath rales rhonchi wheezing ABDOMEN: Soft, flat, nontender, nondistended. Has good bowel sounds. No hepatosplenomegaly appreciated.  EXTREMITIES: No evidence of any cyanosis, clubbing, or peripheral edema.  +2 pedal and radial pulses  bilaterally.  NEUROLOGIC: Limited exam due to patient unable to follow commands SKIN: Moist and warm with no rashes appreciated.  Psych: Anxious to depressed LN: No inguinal LN enlargement    Antibiotics   Anti-infectives    Start     Dose/Rate Route Frequency Ordered Stop   02/23/15 1400  clindamycin (CLEOCIN) IVPB 300 mg  Status:  Discontinued     300 mg 100 mL/hr over 30 Minutes Intravenous 3 times per day 02/23/15 1233 02/24/15 1407   02/23/15  1300  cefTRIAXone (ROCEPHIN) 1 g in dextrose 5 % 50 mL IVPB     1 g 100 mL/hr over 30 Minutes Intravenous Every 24 hours 02/23/15 1233     02/21/15 2130  vancomycin (VANCOCIN) 1,250 mg in sodium chloride 0.9 % 250 mL IVPB  Status:  Discontinued     1,250 mg 166.7 mL/hr over 90 Minutes Intravenous Every 18 hours 02/21/15 1224 02/21/15 1239   02/21/15 1930  vancomycin (VANCOCIN) IVPB 750 mg/150 ml premix  Status:  Discontinued     750 mg 150 mL/hr over 60 Minutes Intravenous Every 12 hours 02/21/15 1239 02/23/15 1232   02/21/15 1300  meropenem (MERREM) 1 g in sodium chloride 0.9 % 100 mL IVPB  Status:  Discontinued     1 g 200 mL/hr over 30 Minutes Intravenous Every 12 hours 02/21/15 1208 02/23/15 1232   02/21/15 1245  vancomycin (VANCOCIN) IVPB 1000 mg/200 mL premix     1,000 mg 200 mL/hr over 60 Minutes Intravenous  Once 02/21/15 1239 02/21/15 1443   02/21/15 1230  vancomycin (VANCOCIN) 1,500 mg in sodium chloride 0.9 % 500 mL IVPB  Status:  Discontinued     1,500 mg 250 mL/hr over 120 Minutes Intravenous  Once 02/21/15 1224 02/21/15 1239   02/20/15 2200  piperacillin-tazobactam (ZOSYN) IVPB 3.375 g  Status:  Discontinued     3.375 g 12.5 mL/hr over 240 Minutes Intravenous 3 times per day 02/20/15 2024 02/21/15 1208   02/20/15 1515  levofloxacin (LEVAQUIN) IVPB 500 mg     500 mg 100 mL/hr over 60 Minutes Intravenous  Once 02/20/15 1511 02/20/15 1610      Medications   Scheduled Meds: . amLODipine  10 mg Oral Daily  .  atorvastatin  80 mg Oral QHS  . calcium-vitamin D  1 tablet Oral BID  . carbamide peroxide  2 drop Both Ears Once per day on Mon Thu  . cefTRIAXone (ROCEPHIN)  IV  1 g Intravenous Q24H  . furosemide  20 mg Intravenous Q12H  . insulin aspart  0-5 Units Subcutaneous QHS  . insulin aspart  0-9 Units Subcutaneous TID WC  . [START ON 02/26/2015] insulin glargine  50 Units Subcutaneous Daily  . ipratropium-albuterol  3 mL Nebulization TID  . losartan  100 mg Oral Daily  . metoprolol  50 mg Oral BID  . multivitamin-lutein  1 capsule Oral BID  . nystatin   Topical BID  . polyethylene glycol  17 g Oral BID  . psyllium  1 packet Oral TID  . QUEtiapine  50 mg Oral QHS  . traZODone  150 mg Oral QHS  . vitamin B-12  1,000 mcg Oral Daily  . warfarin  7.5 mg Oral ONCE-1800  . Warfarin - Pharmacist Dosing Inpatient   Does not apply q1800   Continuous Infusions:   PRN Meds:.acetaminophen **OR** acetaminophen, alum & mag hydroxide-simeth, bisacodyl, HYDROmorphone (DILAUDID) injection, ibuprofen, ondansetron **OR** ondansetron (ZOFRAN) IV, senna-docusate   Data Review:   Micro Results Recent Results (from the past 240 hour(s))  Culture, blood (routine x 2)     Status: None (Preliminary result)   Collection Time: 02/20/15  2:39 PM  Result Value Ref Range Status   Specimen Description BLOOD RIGHT HAND  Final   Special Requests BOTTLES DRAWN AEROBIC AND ANAEROBIC  1CC  Final   Culture NO GROWTH 4 DAYS  Final   Report Status PENDING  Incomplete  Culture, blood (routine x 2)     Status: None (Preliminary result)  Collection Time: 02/20/15  2:39 PM  Result Value Ref Range Status   Specimen Description BLOOD LEFT ANTECUBITAL  Final   Special Requests BOTTLES DRAWN AEROBIC AND ANAEROBIC  1CC  Final   Culture NO GROWTH 4 DAYS  Final   Report Status PENDING  Incomplete  Urine culture     Status: None   Collection Time: 02/20/15  4:00 PM  Result Value Ref Range Status   Specimen Description URINE,  RANDOM  Final   Special Requests NONE  Final   Culture >=100,000 COLONIES/mL PROTEUS MIRABILIS  Final   Report Status 02/22/2015 FINAL  Final   Organism ID, Bacteria PROTEUS MIRABILIS  Final      Susceptibility   Proteus mirabilis - MIC*    AMPICILLIN >=32 RESISTANT Resistant     CEFAZOLIN 8 SENSITIVE Sensitive     CEFTRIAXONE <=1 SENSITIVE Sensitive     CIPROFLOXACIN >=4 RESISTANT Resistant     GENTAMICIN >=16 RESISTANT Resistant     IMIPENEM 2 SENSITIVE Sensitive     NITROFURANTOIN 128 RESISTANT Resistant     TRIMETH/SULFA >=320 RESISTANT Resistant     AMPICILLIN/SULBACTAM >=32 RESISTANT Resistant     PIP/TAZO <=4 SENSITIVE Sensitive     * >=100,000 COLONIES/mL PROTEUS MIRABILIS  Rapid Influenza A&B Antigens ([facility] only)     Status: None   Collection Time: 02/20/15  5:20 PM  Result Value Ref Range Status   Influenza A ([place]) NEGATIVE  Final   Influenza B ([facility]) NEGATIVE  Final  MRSA PCR Screening     Status: None   Collection Time: 02/20/15  8:55 PM  Result Value Ref Range Status   MRSA by PCR NEGATIVE NEGATIVE Final    Comment:        The GeneXpert MRSA Assay (FDA approved for NASAL specimens only), is one component of a comprehensive MRSA colonization surveillance program. It is not intended to diagnose MRSA infection nor to guide or monitor treatment for MRSA infections.     Radiology Reports Dg Chest 1 View  02/24/2015  CLINICAL DATA:  Short of breath EXAM: CHEST 1 VIEW COMPARISON:  02/20/2015 FINDINGS: Cardiac enlargement. Progression of mild interstitial edema. No pleural effusion. Mild bibasilar atelectasis. Right rib fractures noted. IMPRESSION: Progression of mild bilateral airspace disease consistent with interstitial edema. Electronically Signed   By: Franchot Gallo M.D.   On: 02/24/2015 07:15   Dg Chest 1 View  02/20/2015  CLINICAL DATA:  Mildly productive cough and wheezing for the past 2 days ; EXAM: CHEST 1 VIEW PA with one view lateral chest x-ray. The  images are duplicated in PACs. The PA was performed at the [facility] outpatient facility but the patient then faint it and was transported to a [facility] where the lateral was performed. COMPARISON:  PA and lateral chest x-ray of November 05, 2014 FINDINGS: The lungs are adequately inflated. There is mildly increased density in the left lower lobe posteriorly. There is no pleural effusion. The heart is top-normal in size. The pulmonary vascularity is normal. The 7 sternal wires are intact. 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 There is calcification in the wall of the thoracic aorta. The bony thorax exhibits no acute abnormality. IMPRESSION: Subsegmental atelectasis or early pneumonia in the left lower lobe. There is no evidence of pulmonary edema. Followup PA and lateral chest X-ray is recommended in 3-4 weeks following trial of antibiotic therapy to ensure resolution and exclude underlying malignancy. Electronically Signed   By: David  Martinique M.D.  On: 02/20/2015 12:05   Dg Chest 1 View  02/20/2015  CLINICAL DATA:  Mildly productive cough and wheezing for the past 2 days ; EXAM: CHEST 1 VIEW PA with one view lateral chest x-ray. The images are duplicated in PACs. The PA was performed at the [facility] outpatient facility but the patient then faint it and was transported to a [facility] where the lateral was performed. COMPARISON:  PA and lateral chest x-ray of November 05, 2014 FINDINGS: The lungs are adequately inflated. There is mildly increased density in the left lower lobe posteriorly. There is no pleural effusion. The heart is top-normal in size. The pulmonary vascularity is normal. The 7 sternal wires are intact. 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 There is calcification in the wall of the thoracic aorta. The bony thorax exhibits no acute abnormality. IMPRESSION: Subsegmental atelectasis or early pneumonia in the left lower lobe. There is no evidence of pulmonary edema. Followup PA and lateral chest  X-ray is recommended in 3-4 weeks following trial of antibiotic therapy to ensure resolution and exclude underlying malignancy. Electronically Signed   By: David  Martinique M.D.   On: 02/20/2015 12:05   Dg Ribs Unilateral Right  02/20/2015  CLINICAL DATA:  Right-sided rib pain following fall, initial encounter EXAM: RIGHT RIBS - 2 VIEW COMPARISON:  None. FINDINGS: Mildly displaced fractures of the eighth, ninth and tenth ribs are noted on the right posteriorly. No other fractures are seen. No underlying pneumothorax is noted. IMPRESSION: Multiple rib fractures posteriorly on the right consistent with the recent injury. No pneumothorax noted. Electronically Signed   By: Inez Catalina M.D.   On: 02/20/2015 12:32   Ct Head Wo Contrast  02/24/2015  CLINICAL DATA:  Change in pt status, decreased responses, delayed responses. Pinpoint pupils with sluggish response to light. Irregular pulse wit hx. of a-fib. Cool, clammy skin. BG 164. VS WNL. Pt has been on/off CPaP throughout the day due to drowsiness. EXAM: CT HEAD WITHOUT CONTRAST TECHNIQUE: Contiguous axial images were obtained from the base of the skull through the vertex without intravenous contrast. COMPARISON:  02/20/2015 FINDINGS: The ventricles are enlarged, to a greater degree than the sulci, but stable when compared to the prior exam. Overall atrophy is advanced for age. Encephalomalacia seen along the posterior medial right parietal and adjacent occipital lobe reflecting an old infarct, stable. There is no evidence of a recent cortical infarct. There are no parenchymal masses or mass effect. Periventricular white matter hypoattenuation is noted consistent with mild chronic microvascular ischemic change. There are no extra-axial masses or abnormal fluid collections. There is no intracranial hemorrhage. Visualized sinuses and mastoid air cells are clear. IMPRESSION: 1. No acute intracranial abnormalities. 2. Advanced atrophy.  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 Electronically Signed   By: Lajean Manes M.D.   On: 02/24/2015 17:43   Ct Head Wo Contrast  02/20/2015  CLINICAL DATA:  Fall today, on Coumadin.  History of stroke. EXAM: CT HEAD WITHOUT CONTRAST TECHNIQUE: Contiguous axial images were obtained from the base of the skull through the vertex without intravenous contrast. COMPARISON:  08/03/2013 FINDINGS: There is atrophy and chronic small vessel disease changes. Old right occipital infarct. No acute intracranial abnormality. Specifically, no hemorrhage, hydrocephalus, mass lesion, acute infarction, or significant intracranial injury. No acute calvarial abnormality. Visualized paranasal sinuses and mastoids clear. Orbital soft tissues unremarkable. IMPRESSION: Old right occipital infarct. No acute intracranial abnormality. Atrophy, chronic microvascular disease. Electronically Signed   By: Rolm Baptise M.D.  On: 02/20/2015 16:46   Ct Chest W Contrast  02/24/2015  CLINICAL DATA:  Follow-up chest x-ray.  Short breath EXAM: CT CHEST WITH CONTRAST TECHNIQUE: Multidetector CT imaging of the chest was performed during intravenous contrast administration. CONTRAST:  58mL OMNIPAQUE IOHEXOL 300 MG/ML  SOLN COMPARISON:  Chest CT 08/10/2011 FINDINGS: Mediastinum/Nodes: No axillary supraclavicular lymphadenopathy. No axillary adenopathy. The thyroid gland is enlarged coarse calcifications. Mild mediastinal hilar adenopathy is present. 12 mm RIGHT lower paratracheal lymph node for example. 10 mm RIGHT hilar lymph node on image 26 series Lungs/Pleura: Small bilateral pleural effusions with passive atelectasis in the lower lobes. No discrete nodularity. No airspace disease. No pneumothorax. Upper abdomen: Limited view of the liver, kidneys, pancreas are unremarkable. Normal adrenal glands. Musculoskeletal: No aggressive osseous lesion. There is fractures of the posterior lateral sixth through tenth ribs. Several the lower lower ribs fractures are mildly displaced  (image 44, series). No pneumothorax with IMPRESSION: 1. Multiple Posterior RIGHT rib fractures (sixth through tenth ribs). No pneumothorax 2. Mild mediastinal adenopathy likely reactive. 3. Small bilateral pleural effusions and passive atelectasis. 4. Nodular from thyroid gland likely represents a benign goiter Electronically Signed   By: Suzy Bouchard M.D.   On: 02/24/2015 09:48   Dg Chest Port 1 View  02/20/2015  CLINICAL DATA:  Status post fall today with multiple right rib fractures. Initial encounter. EXAM: PORTABLE CHEST 1 VIEW COMPARISON:  PA and lateral chest and dedicated plain films of the ribs earlier today. FINDINGS: There is no pneumothorax. The lungs are clear. Heart size is enlarged. Right rib fractures are not as well seen on this study as on dedicated plain films of the ribs. IMPRESSION: Negative for pneumothorax. Right rib fractures are better demonstrated on plain films earlier today. Cardiomegaly. Electronically Signed   By: Inge Rise M.D.   On: 02/20/2015 17:47     CBC  Recent Labs Lab 02/20/15 1439 02/21/15 0353 02/22/15 0335 02/23/15 0408 02/24/15 0849  WBC 14.1* 9.8 9.7 10.3 12.9*  HGB 12.4* 10.6* 10.6* 10.8* 10.8*  HCT 37.2* 31.2* 31.5* 32.0* 32.8*  PLT 185 162 173 193 221  MCV 83.8 83.8 85.5 84.4 84.1  MCH 27.9 28.6 28.7 28.4 27.7  MCHC 33.3 34.1 33.5 33.7 33.0  RDW 15.3* 15.5* 15.2* 15.3* 15.1*  LYMPHSABS 0.6*  --   --   --   --   MONOABS 1.3*  --   --   --   --   EOSABS 0.0  --   --   --   --   BASOSABS 0.0  --   --   --   --     Chemistries   Recent Labs Lab 02/20/15 1551 02/21/15 0353 02/22/15 0335 02/23/15 0408 02/24/15 0849  NA 128* 130* 135 138 133*  K 4.3 4.1 4.0 4.1 3.8  CL 96* 99* 102 105 103  CO2 24 26 25 27 26   GLUCOSE 191* 153* 169* 127* 141*  BUN 39* 34* 31* 28* 24*  CREATININE 1.70* 1.57* 1.62* 1.22 1.26*  CALCIUM 8.3* 8.2* 8.3* 8.2* 7.8*  AST 23  --   --   --   --   ALT 19  --   --   --   --   ALKPHOS 76  --   --   --    --   BILITOT 1.1  --   --   --   --    ------------------------------------------------------------------------------------------------------------------ estimated creatinine clearance is 55.9 mL/min (by C-G formula  based on Cr of 1.26). ------------------------------------------------------------------------------------------------------------------ No results for input(s): HGBA1C in the last 72 hours. ------------------------------------------------------------------------------------------------------------------ No results for input(s): CHOL, HDL, LDLCALC, TRIG, CHOLHDL, LDLDIRECT in the last 72 hours. ------------------------------------------------------------------------------------------------------------------ No results for input(s): TSH, T4TOTAL, T3FREE, THYROIDAB in the last 72 hours.  Invalid input(s): FREET3 ------------------------------------------------------------------------------------------------------------------ No results for input(s): VITAMINB12, FOLATE, FERRITIN, TIBC, IRON, RETICCTPCT in the last 72 hours.  Coagulation profile  Recent Labs Lab 02/20/15 1439 02/23/15 0408 02/24/15 0849 02/25/15 0543  INR 3.00 5.59* 8.20* 1.55    No results for input(s): DDIMER in the last 72 hours.  Cardiac Enzymes No results for input(s): CKMB, TROPONINI, MYOGLOBIN in the last 168 hours.  Invalid input(s): CK ------------------------------------------------------------------------------------------------------------------ Invalid input(s): POCBNP    Assessment & Plan   75 year male with dementia and atrial fibrillation on anticoagulation who initially presented for an outpatient chest x-ray for cough and subsequently fell due to weakness and now separated rib fracture.  1. Fever: CT scan of the chest shows no evidence of pneumonia, fever persist, we'll repeat urinalysis and blood cultures have infectious disease see the patient  Patient also has a urinary tract  infection with Proteus  2. Rib fracture: Incentive spirometry if patient able to do 3. Dementia: Observe patient for signs of delirium. Continue Seroquel and trazodone 4. Diabetes: Continue sliding scale insulin  5. Atrial fibrillation -INR now low we'll go ahead and restart Coumadin  6. Essential hypertension: Continue metoprolol, Norvasc and losartan   Patient was DO NOT RESUSCITATE his wife changed him to full code I will ask palliative care team to currently full code  Code Status History    Date Active Date Inactive Code Status Order ID Comments User Context   02/20/2015  8:18 PM 02/20/2015  8:19 PM DNR [id number]  Bettey Costa, MD Inpatient   02/20/2015  7:55 PM 02/20/2015  8:18 PM Full Code [id number]  Bettey Costa, MD ED   12/12/2014  9:38 AM 12/13/2014  3:23 AM Full Code [id number]  Inez Catalina, MD HOV    Questions for Most Recent Historical Code Status (Order [id number])    Question Answer Comment   In the event of cardiac or respiratory ARREST Do not call a "code blue"    In the event of cardiac or respiratory ARREST Do not perform Intubation, CPR, defibrillation or ACLS    In the event of cardiac or respiratory ARREST Use medication by any route, position, wound care, and other measures to relive pain and suffering. May use oxygen, suction and manual treatment of airway obstruction as needed for comfort.     Advance Directive Documentation        Most Recent Value   Type of Advance Directive  Healthcare Power of Attorney   Pre-existing out of facility DNR order (yellow form or pink MOST form)     "MOST" Form in Place?             Consults  none  DVT Prophylaxis  coumadin  Lab Results  Component Value Date   PLT 221 02/24/2015     Time Spent in minutes  90min   Sofija Antwi M.D on 02/25/2015 at 12:16 PM  Between 7am to 6pm - Pager - [phone]  After 6pm go to [email or web address] - password [email or web address] [facility]   Office  [phone]

## 2015-02-25 NOTE — Care Management Important Message (Signed)
Important Message  Patient Details  Name: Bobby Jacobs MRN: [id number] Date of Birth: [birth year]   Medicare Important Message Given:  Yes    Juliann Pulse A Jawanda Passey 02/25/2015, 9:23 AM

## 2015-02-25 NOTE — Progress Notes (Signed)
Clinical Education officer, museum (CSW) followed up with patient's wife for SNF choice. Wife chose [facility]. Joann admissions coordinator at [facility] is aware of accepted bed offer. CSW will continue to follow and assist as needed.   Blima Rich, LCSW [phone]

## 2015-02-25 NOTE — Care Management (Signed)
Palliative pending. I have asked [facility] to review to see if they can assist with patient needs.

## 2015-02-25 NOTE — Consult Note (Addendum)
Bobby CONSULT NOTE - Follow Up  Pharmacy Consult for warfarin Indication: atrial fibrillation  No Known Allergies  Patient Measurements: Height: 5\' 10"  (177.8 cm) Weight: 195 lb (88.451 kg) IBW/kg (Calculated) : 73  Vital Signs: Temp: 99.1 F (37.3 C) (02/01 0539) Temp Source: Axillary (02/01 0539) BP: 121/63 mmHg (02/01 0339) Pulse Rate: 99 (02/01 0339)  Labs:  Recent Labs  02/23/15 0408 02/24/15 0849 02/25/15 0543  HGB 10.8* 10.8*  --   HCT 32.0* 32.8*  --   PLT 193 221  --   LABPROT 48.9* 65.2* 18.6*  INR 5.59* 8.20* 1.55  CREATININE 1.22 1.26*  --     Estimated Creatinine Clearance: 55.9 mL/min (by C-G formula based on Cr of 1.26).   Medical History: Past Medical History  Diagnosis Date  . Hypertension   . Coronary artery disease   . Depression   . GERD (gastroesophageal reflux disease)   . Cardiovascular disease   . High cholesterol   . Pneumonia   . Fracture   . Obesity   . Sleep apnea   . CHF (congestive heart failure) ([place])   . Dementia   . Atrial fibrillation ([place])   . Urinary retention   . Urine frequency   . Incomplete bladder emptying   . Stroke ([place])   . Peripheral vascular disease ([place])   . Aortic aneurysm ([place])   . Aortic valve replaced   . Hyperthyroidism   . Diabetes mellitus, type 2 ([place])   . Skin cancer     Medications:  Scheduled:  . amLODipine  10 mg Oral Daily  . atorvastatin  80 mg Oral QHS  . calcium-vitamin D  1 tablet Oral BID  . carbamide peroxide  2 drop Both Ears Once per day on Mon Thu  . cefTRIAXone (ROCEPHIN)  IV  1 g Intravenous Q24H  . furosemide  20 mg Intravenous Q12H  . insulin aspart  0-5 Units Subcutaneous QHS  . insulin aspart  0-9 Units Subcutaneous TID WC  . insulin glargine  43 Units Subcutaneous Daily  . ipratropium-albuterol  3 mL Nebulization TID  . losartan  100 mg Oral Daily  . metoprolol  50 mg Oral BID  . multivitamin-lutein  1 capsule Oral BID  . nystatin   Topical BID  .  polyethylene glycol  17 g Oral BID  . psyllium  1 packet Oral TID  . QUEtiapine  50 mg Oral QHS  . traZODone  150 mg Oral QHS  . vitamin B-12  1,000 mcg Oral Daily  . Warfarin - Pharmacist Dosing Inpatient   Does not apply q1800   Assessment: LA is a 75yo male admitted for weakness and cough. Pt on warfarin as outpatient for Afib. OP regimen is warfarin 7.5mg  PO QHS. Patient's INR on admit was 3.  Goal of Therapy:  INR 2-3 Monitor platelets by Bobby protocol: Yes   Plan:  1/27 admit INR 3 1/30 INR 5.59. Supratherapeutic. Hold dose and recheck INR with AM labs. 1/31 INR 8.2. Supratherapeutic. Patient received 10mg  IV Vitamin K x1 at 1130 this morning. Follow up and recheck INR with AM labs. 2/1 INR 1.55. Subtherapeutic. Will restart patient's home regimen of 7.5mg  daily. Recheck INR with AM labs.  Thank you for including pharmacy in this patient's care. Pharmacy will continue to monitor.  Roe Coombs, PharmD Pharmacy Resident 02/25/2015

## 2015-02-25 NOTE — Clinical Social Work Placement (Signed)
   CLINICAL SOCIAL WORK PLACEMENT  NOTE  Date:  02/25/2015  Patient Details  Name: Bobby Jacobs MRN: [id number] Date of Birth: 24-Feb-1940  Clinical Social Work is seeking post-discharge placement for this patient at the [place] level of care (*CSW will initial, date and re-position this form in  chart as items are completed):  Yes   Patient/family provided with [facility] Work Department's list of facilities offering this level of care within the geographic area requested by the patient (or if unable, by the patient's family).  Yes   Patient/family informed of their freedom to choose among providers that offer the needed level of care, that participate in Medicare, Medicaid or managed care program needed by the patient, have an available bed and are willing to accept the patient.  Yes   Patient/family informed of [facility]'s ownership interest in [facility] and [facility], as well as of the fact that they are under no obligation to receive care at these facilities.  PASRR submitted to EDS on       PASRR number received on       Existing PASRR number confirmed on 02/25/15     FL2 transmitted to all facilities in geographic area requested by pt/family on 02/24/15     FL2 transmitted to all facilities within larger geographic area on       Patient informed that his/her managed care company has contracts with or will negotiate with certain facilities, including the following:        Yes   Patient/family informed of bed offers received.  Patient chooses bed at       Physician recommends and patient chooses bed at      Patient to be transferred to   on  .  Patient to be transferred to facility by       Patient family notified on   of transfer.  Name of family member notified:        PHYSICIAN       Additional Comment:    _______________________________________________ Loralyn Freshwater, LCSW 02/25/2015, 1:25 PM

## 2015-02-25 NOTE — Progress Notes (Signed)
PT is recommending SNF. Clinical Social Worker (CSW) met with patient and his wife Charise Killian was at bedside. CSW discussed SNF options. Wife is agreeable to SNF. CSW presented bed offers. Wife reported that she would discuss bed offers with [facility] and get back with CSW. CSW will continue to follow and assist as needed.   Blima Rich, LCSW [phone]

## 2015-02-26 ENCOUNTER — Inpatient Hospital Stay: Payer: Medicare Other

## 2015-02-26 LAB — CULTURE, BLOOD (ROUTINE X 2)
CULTURE: NO GROWTH
CULTURE: NO GROWTH

## 2015-02-26 LAB — GLUCOSE, CAPILLARY
GLUCOSE-CAPILLARY: 214 mg/dL — AB (ref 65–99)
GLUCOSE-CAPILLARY: 283 mg/dL — AB (ref 65–99)
GLUCOSE-CAPILLARY: 335 mg/dL — AB (ref 65–99)

## 2015-02-26 LAB — CBC
HCT: 33.3 % — ABNORMAL LOW (ref 40.0–52.0)
HEMOGLOBIN: 11 g/dL — AB (ref 13.0–18.0)
MCH: 28.2 pg (ref 26.0–34.0)
MCHC: 33.1 g/dL (ref 32.0–36.0)
MCV: 85 fL (ref 80.0–100.0)
PLATELETS: 300 10*3/uL (ref 150–440)
RBC: 3.92 MIL/uL — ABNORMAL LOW (ref 4.40–5.90)
RDW: 15.2 % — ABNORMAL HIGH (ref 11.5–14.5)
WBC: 14.1 10*3/uL — ABNORMAL HIGH (ref 3.8–10.6)

## 2015-02-26 LAB — BASIC METABOLIC PANEL
Anion gap: 6 (ref 5–15)
BUN: 30 mg/dL — AB (ref 6–20)
CHLORIDE: 104 mmol/L (ref 101–111)
CO2: 28 mmol/L (ref 22–32)
CREATININE: 1.25 mg/dL — AB (ref 0.61–1.24)
Calcium: 8.3 mg/dL — ABNORMAL LOW (ref 8.9–10.3)
GFR calc Af Amer: >60 mL/min (ref >60)
GFR calc non Af Amer: 54 mL/min — ABNORMAL LOW (ref >60)
Glucose, Bld: 180 mg/dL — ABNORMAL HIGH (ref 65–99)
Potassium: 3.7 mmol/L (ref 3.5–5.1)
SODIUM: 138 mmol/L (ref 135–145)

## 2015-02-26 LAB — PROTIME-INR
INR: 1.54
PROTHROMBIN TIME: 18.5 s — AB (ref 11.4–15.0)

## 2015-02-26 MED ORDER — INSULIN ASPART 100 UNIT/ML ~~LOC~~ SOLN
4.0000 [IU] | Freq: Three times a day (TID) | SUBCUTANEOUS | Status: DC
  Administered 2015-02-26 – 2015-03-01 (×8): 4 [IU] via SUBCUTANEOUS
  Filled 2015-02-26 (×8): qty 4

## 2015-02-26 MED ORDER — IOHEXOL 300 MG/ML  SOLN
100.0000 mL | Freq: Once | INTRAMUSCULAR | Status: AC | PRN
  Administered 2015-02-26: 100 mL via INTRAVENOUS

## 2015-02-26 MED ORDER — WARFARIN SODIUM 5 MG PO TABS
7.5000 mg | ORAL_TABLET | Freq: Once | ORAL | Status: AC
  Administered 2015-02-26: 7.5 mg via ORAL
  Filled 2015-02-26: qty 2

## 2015-02-26 MED ORDER — HYDROMORPHONE HCL 1 MG/ML IJ SOLN
0.5000 mg | Freq: Once | INTRAMUSCULAR | Status: AC
  Administered 2015-02-26: 0.5 mg via INTRAVENOUS
  Filled 2015-02-26: qty 1

## 2015-02-26 NOTE — Progress Notes (Signed)
Physical Therapy Treatment Patient Details Name: Bobby Jacobs MRN: [id number] DOB: Jan 29, 1940 Today's Date: 02/26/2015    History of Present Illness Pt admitted for pneumonia with acute R rib fracture #6-10 from fall. During OP chest x-Imogene Gravelle, pt fell of stool and obtained rib fractures. Pt with history of dementia/diabetes/HTN.    PT Comments    Pt is making good progress towards goals with ability to tolerate EOB sitting for approx 5-6 minutes while performing balance activities. Pt with multiple LOB in post directions during balance activities, however able to improve balance to only requiring supervision. Unable to tolerate further OOB mobility at this time secondary to fatigue and safety. Good endurance with there-ex. Mobility performed while on 2L of O2 at 94% with HR at 121 during mobility.  Follow Up Recommendations  SNF     Equipment Recommendations       Recommendations for Other Services       Precautions / Restrictions Precautions Precautions: Fall Restrictions Weight Bearing Restrictions: No    Mobility  Bed Mobility Overal bed mobility: Needs Assistance;+2 for physical assistance Bed Mobility: Supine to Sit     Supine to sit: +2 for physical assistance;Mod assist     General bed mobility comments: bed mobility performed with +2 assist for sitting at EOB. Once seated at EOB, pt able to sit with min assist. At EOB, pt with poor sitting balance requiring min/mod assist for maintaing upright posture. Pt then performed seated balance activities and improved balance to supervision  Transfers                 General transfer comment: unable at this time  Ambulation/Gait             General Gait Details: unable at this time   Stairs            Wheelchair Mobility    Modified Rankin (Stroke Patients Only)       Balance                                    Cognition Arousal/Alertness: Awake/alert Behavior During  Therapy: WFL for tasks assessed/performed Overall Cognitive Status: History of cognitive impairments - at baseline                      Exercises Other Exercises Other Exercises: Pt performed seated balance exercises x 10 reps including B UE reaching in multiple directions. Pt also performed seated pertabations in multiple directions, losing balance consistently in post direction requiring min assist for correct technique.  Other Exercises: Supine ther-ex performed including B LE SLRs, hip abd/add, quad sets, heel slides, and SAQ. All ther-ex performed x 12 reps with cga. Pt requires 2 rest breaks secondary to fatigue.    General Comments        Pertinent Vitals/Pain Pain Assessment: Faces Faces Pain Scale: Hurts little more Pain Location: R side Pain Descriptors / Indicators: Aching Pain Intervention(s): Limited activity within patient's tolerance    Home Living                      Prior Function            PT Goals (current goals can now be found in the care plan section) Acute Rehab PT Goals Patient Stated Goal: to get stronger PT Goal Formulation: With patient Time For Goal Achievement: 03/11/15 Potential to Achieve  Goals: Good Progress towards PT goals: Progressing toward goals    Frequency  7X/week    PT Plan Current plan remains appropriate    Co-evaluation             End of Session Equipment Utilized During Treatment: Gait belt Activity Tolerance: Patient limited by fatigue;Patient limited by pain Patient left: in bed;with bed alarm set     Time: [id number] PT Time Calculation (min) (ACUTE ONLY): 23 min  Charges:  $Therapeutic Exercise: 8-22 mins $Neuromuscular Re-education: 8-22 mins                    G Codes:      Quanna Wittke 03-07-15, 12:42 PM  Greggory Stallion, PT, DPT [phone]

## 2015-02-26 NOTE — Progress Notes (Signed)
Inpatient Diabetes Program Recommendations  AACE/ADA: New Consensus Statement on Inpatient Glycemic Control (2015)  Target Ranges:  Prepandial:   less than 140 mg/dL      Peak postprandial:   less than 180 mg/dL (1-2 hours)      Critically ill patients:  140 - 180 mg/dL   Review of Glycemic Control  Results for ORANGE, GELIN (MRN [id number]) as of 02/26/2015 10:18  Ref. Range 02/24/2015 20:56 02/25/2015 10:47 02/25/2015 16:19 02/25/2015 20:47 02/26/2015 08:53  Glucose-Capillary Latest Ref Range: 65-99 mg/dL 240 (H) 321 (H) 282 (H) 259 (H) 214 (H)   Diabetes history: DM2 Outpatient Diabetes medications: Toujeo 43 units daily, Humalog 5 units TID with meals Current orders for Inpatient glycemic control: Lantus 50 units daily(started today), Novolog 0-9 units TID with meals, Novolog 0-5 units HS  Inpatient Diabetes Program Recommendations: Insulin - Meal Coverage: If patient eats at least 50% of meal, please consider ordering Novolog 3 units TID with meals for meal coverage (in addition to Novolog correction scale). Post prandial blood sugars elevated.  Gentry Fitz, RN, BA, MHA, CDE Diabetes Coordinator Inpatient Diabetes Program  [phone] (Team Pager) [phone] ([place]) 02/26/2015 10:23 AM

## 2015-02-26 NOTE — Progress Notes (Signed)
Speech Language Pathology Treatment: Dysphagia  Patient Details Name: Bobby Jacobs MRN: [id number] DOB: 1940/08/05 Today's Date: 02/26/2015 Time: 1030-1105 SLP Time Calculation (min) (ACUTE ONLY): 35 min  Assessment / Plan / Recommendation Clinical Impression  Pt appears to present w/ adequate toleration of nectar liquids, however, has been exhibiting s/s of oral phase dysphagia c/b oral holding and pocketing - suspect this is directly related to his declined Cognitive status and Dementia during this lengthy hospitalization. Educational tx session w/ wife present in room on dysphagia, dysphagia diet consistency, and food options/preparation. Discussed and reviewed aspiration precautions and feeding support and facilitation including checking for oral clearing and strategies to aid oral clearing. Rec. Dys. 2 diet w/ Nectar liquids; aspiration precautions; feeding assistance and meds in Puree - crushed as able. ST will be available for f/u for further education while admitted if [facility]. Wife agreed. NSG updated.    HPI HPI: Pt is a 75 y.o. male with a known dxs of Dementia, Obesity, sleep apnea, HTN, bladder issues w/ foley,  GERD, CHF, stroke, diabetes, and multiple other medical issues who presents with above complaint. Patient had cough and wheezing a few days ago and was requested for outpatient chest x-ray. Outpatient chest x-ray was ordered which shows left lower lobe pneumonia. During the x-ray the patient fell off the stool landing on his right ribs. He presents to the emergency room due to rib pain pain. X-ray does confirm rib fractures. Pt resides at a NH and has a hired Building control surveyor at his place of residence. He is verbally responsive w/ some confusion noted in communication exchange. Wife and caregive report pt is impulsive when he eats and has to be monitored in order that he not "put so much food in his mouth at a time". Wife endorsed episodes of getting choked w/ food and drink "when he  put so much in his mouth". Caregiver indicated pt has been coughing at the NH when drinking liquids (only) "sometimes"; pt has not been dx'd w/ any respiratory dis. "in recent years" per wife. Currently, pt is on a dysphagia diet and has been tolerating the liquids, however, increased oral phase deficits are being noted by NSG and wife increased more as he remains hospitalized - suspect d/t the baseline Dementia.       SLP Plan  Continue with current plan of care     Recommendations  Diet recommendations: Dysphagia 2 (fine chop);Nectar-thick liquid Liquids provided via: Cup Medication Administration: Crushed with puree Supervision: Staff to assist with self feeding;Full supervision/cueing for compensatory strategies;Trained caregiver to feed patient Compensations: Minimize environmental distractions;Slow rate;Small sips/bites;Follow solids with liquid;Multiple dry swallows after each bite/sip;Lingual sweep for clearance of pocketing Postural Changes and/or Swallow Maneuvers: Seated upright 90 degrees             Oral Care Recommendations: Oral care BID;Staff/trained caregiver to provide oral care Follow up Recommendations:  (TBD) Plan: Continue with current plan of care     [place], [place], CCC-SLP  Fia Hebert 02/26/2015, 1:11 PM

## 2015-02-26 NOTE — Progress Notes (Signed)
[place] INFECTIOUS DISEASE PROGRESS NOTE Date of Admission:  02/20/2015     ID: Bobby Jacobs is a 75 y.o. male with  fevers, uti, chronic sp cath Active Problems:   Pneumonia   Subjective: Still with fevers, mild cough,   ROS  Eleven systems are reviewed and negative except per hpi  Medications:  Antibiotics Given (last 72 hours)    Date/Time Action Medication Dose Rate   02/23/15 1413 Given   cefTRIAXone (ROCEPHIN) 1 g in dextrose 5 % 50 mL IVPB 1 g 100 mL/hr   02/23/15 1458 Given   clindamycin (CLEOCIN) IVPB 300 mg 300 mg 100 mL/hr   02/23/15 2109 Given   clindamycin (CLEOCIN) IVPB 300 mg 300 mg 100 mL/hr   02/24/15 0513 Given   clindamycin (CLEOCIN) IVPB 300 mg 300 mg 100 mL/hr   02/24/15 2058 Given   cefTRIAXone (ROCEPHIN) 1 g in dextrose 5 % 50 mL IVPB 1 g 100 mL/hr   02/25/15 1245 Given   cefTRIAXone (ROCEPHIN) 1 g in dextrose 5 % 50 mL IVPB 1 g 100 mL/hr   02/26/15 1240 Given   cefTRIAXone (ROCEPHIN) 1 g in dextrose 5 % 50 mL IVPB 1 g 100 mL/hr     . amLODipine  10 mg Oral Daily  . atorvastatin  80 mg Oral QHS  . calcium-vitamin D  1 tablet Oral BID  . carbamide peroxide  2 drop Both Ears Once per day on Mon Thu  . cefTRIAXone (ROCEPHIN)  IV  1 g Intravenous Q24H  . furosemide  20 mg Intravenous Q12H  . insulin aspart  0-5 Units Subcutaneous QHS  . insulin aspart  0-9 Units Subcutaneous TID WC  . insulin aspart  4 Units Subcutaneous TID WC  . insulin glargine  50 Units Subcutaneous Daily  . ipratropium-albuterol  3 mL Nebulization TID  . losartan  100 mg Oral Daily  . metoprolol  75 mg Oral BID  . multivitamin-lutein  1 capsule Oral BID  . nystatin   Topical BID  . polyethylene glycol  17 g Oral BID  . psyllium  1 packet Oral TID  . QUEtiapine  50 mg Oral QHS  . traZODone  150 mg Oral QHS  . vitamin B-12  1,000 mcg Oral Daily  . warfarin  7.5 mg Oral ONCE-1800  . Warfarin - Pharmacist Dosing Inpatient   Does not apply q1800     Objective: Vital signs in last 24 hours: Temp:  [98.7 F (37.1 C)-102.2 F (39 C)] 98.7 F (37.1 C) (02/02 0849) Pulse Rate:  [58-137] 137 (02/02 0849) Resp:  [18-20] 20 (02/02 0849) BP: (148-176)/(65-98) 170/90 mmHg (02/02 0849) SpO2:  [96 %-99 %] 99 % (02/02 0849) Constitutional: frail, chronically ill appearing HENT: anicteric Mouth/Throat: Oropharynx is clear and dry. No oropharyngeal exudate.  Cardiovascular: Normal rate, regular rhythm and normal heart sounds. Pulmonary/Chest: Effort normal and breath sounds normal. No respiratory distress. He has no wheezes.  Abdominal: Soft. Bowel sounds are normal. He exhibits no distension. There is no tenderness.  SP cath site wnl Lymphadenopathy: He has no cervical adenopathy.  Neurological: alert, flat facies, + cog wheeling Skin: Skin is warm and dry. No rash noted. No erythema.  Psychiatric: He has a normal mood and affect. His behavior is normal.   Lab Results  Recent Labs  02/25/15 1131 02/26/15 0556  WBC 12.1* 14.1*  HGB 10.8* 11.0*  HCT 32.8* 33.3*  NA 137 138  K 4.0 3.7  CL 101 104  CO2 28 28  BUN 29* 30*  CREATININE 1.28* 1.25*    Microbiology: Results for orders placed or performed during the hospital encounter of 02/20/15  Culture, blood (routine x 2)     Status: None   Collection Time: 02/20/15  2:39 PM  Result Value Ref Range Status   Specimen Description BLOOD RIGHT HAND  Final   Special Requests BOTTLES DRAWN AEROBIC AND ANAEROBIC  1CC  Final   Culture NO GROWTH 6 DAYS  Final   Report Status 02/26/2015 FINAL  Final  Culture, blood (routine x 2)     Status: None   Collection Time: 02/20/15  2:39 PM  Result Value Ref Range Status   Specimen Description BLOOD LEFT ANTECUBITAL  Final   Special Requests BOTTLES DRAWN AEROBIC AND ANAEROBIC  1CC  Final   Culture NO GROWTH 6 DAYS  Final   Report Status 02/26/2015 FINAL  Final  Urine culture     Status: None   Collection Time: 02/20/15  4:00 PM   Result Value Ref Range Status   Specimen Description URINE, RANDOM  Final   Special Requests NONE  Final   Culture >=100,000 COLONIES/mL PROTEUS MIRABILIS  Final   Report Status 02/22/2015 FINAL  Final   Organism ID, Bacteria PROTEUS MIRABILIS  Final      Susceptibility   Proteus mirabilis - MIC*    AMPICILLIN >=32 RESISTANT Resistant     CEFAZOLIN 8 SENSITIVE Sensitive     CEFTRIAXONE <=1 SENSITIVE Sensitive     CIPROFLOXACIN >=4 RESISTANT Resistant     GENTAMICIN >=16 RESISTANT Resistant     IMIPENEM 2 SENSITIVE Sensitive     NITROFURANTOIN 128 RESISTANT Resistant     TRIMETH/SULFA >=320 RESISTANT Resistant     AMPICILLIN/SULBACTAM >=32 RESISTANT Resistant     PIP/TAZO <=4 SENSITIVE Sensitive     * >=100,000 COLONIES/mL PROTEUS MIRABILIS  Rapid Influenza A&B Antigens ([facility] only)     Status: None   Collection Time: 02/20/15  5:20 PM  Result Value Ref Range Status   Influenza A (Green) NEGATIVE  Final   Influenza B ([facility]) NEGATIVE  Final  MRSA PCR Screening     Status: None   Collection Time: 02/20/15  8:55 PM  Result Value Ref Range Status   MRSA by PCR NEGATIVE NEGATIVE Final    Comment:        The GeneXpert MRSA Assay (FDA approved for NASAL specimens only), is one component of a comprehensive MRSA colonization surveillance program. It is not intended to diagnose MRSA infection nor to guide or monitor treatment for MRSA infections.   CULTURE, BLOOD (ROUTINE X 2) w Reflex to PCR ID Panel     Status: None (Preliminary result)   Collection Time: 02/25/15  2:29 PM  Result Value Ref Range Status   Specimen Description BLOOD RIGHT ASSIST CONTROL  Final   Special Requests   Final    BOTTLES DRAWN AEROBIC AND ANAEROBIC AERO 3CC ANA 5CC   Culture NO GROWTH < 24 HOURS  Final   Report Status PENDING  Incomplete  CULTURE, BLOOD (ROUTINE X 2) w Reflex to PCR ID Panel     Status: None (Preliminary result)   Collection Time: 02/25/15  2:29 PM  Result Value Ref Range Status    Specimen Description BLOOD LEFT ASSIST CONTROL  Final   Special Requests BOTTLES DRAWN AEROBIC AND ANAEROBIC 3CC  Final   Culture NO GROWTH < 24 HOURS  Final   Report Status PENDING  Incomplete  Studies/Results: Ct Head Wo Contrast  02/24/2015  CLINICAL DATA:  Change in pt status, decreased responses, delayed responses. Pinpoint pupils with sluggish response to light. Irregular pulse wit hx. of a-fib. Cool, clammy skin. BG 164. VS WNL. Pt has been on/off CPaP throughout the day due to drowsiness. EXAM: CT HEAD WITHOUT CONTRAST TECHNIQUE: Contiguous axial images were obtained from the base of the skull through the vertex without intravenous contrast. COMPARISON:  02/20/2015 FINDINGS: The ventricles are enlarged, to a greater degree than the sulci, but stable when compared to the prior exam. Overall atrophy is advanced for age. Encephalomalacia seen along the posterior medial right parietal and adjacent occipital lobe reflecting an old infarct, stable. There is no evidence of a recent cortical infarct. There are no parenchymal masses or mass effect. Periventricular white matter hypoattenuation is noted consistent with mild chronic microvascular ischemic change. There are no extra-axial masses or abnormal fluid collections. There is no intracranial hemorrhage. Visualized sinuses and mastoid air cells are clear. IMPRESSION: 1. No acute intracranial abnormalities. 2. Advanced atrophy.  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 Electronically Signed   By: Lajean Manes M.D.   On: 02/24/2015 17:43    Assessment/Plan: Bobby Jacobs is a 75 y.o. male with Dementia, DM admitted with cough, and wheezing. He had CXR done showing LLL pna (had rib fx from fall while getting xray). He has a chronic suprapubic cath and has had recurrent UTIs. UA with TNTC wbc on admit and ucx proteus. BCX negative 1/27 and 2/1. Flu negative.  Had CT neg for infiltrate On admit had fevers to 101.9, wbc 14. Started on levo  and zosyn. Vanco added 1/28. Changed to ctx 1/30. He has recurrent fevers despite abx. He also has recurrent confusion and tachycardia without etiology. May be having aspiration events or could have pyelonephritis as cause of his recurrent fevers  Recommendations Continue ceftraixone for now pending fu  cultures Agree with imaging his urinary system today Has seen Speech and swallow Thank you very much for the consult. Will follow with you.  [place], [place]   02/26/2015, 1:03 PM

## 2015-02-26 NOTE — Consult Note (Signed)
ANTICOAGULATION CONSULT NOTE - Follow Up  Pharmacy Consult for warfarin Indication: atrial fibrillation  No Known Allergies  Patient Measurements: Height: 5\' 10"  (177.8 cm) Weight: 195 lb (88.451 kg) IBW/kg (Calculated) : 73  Vital Signs: Temp: 100 F (37.8 C) (02/02 0328) Temp Source: Axillary (02/02 0328) BP: 154/65 mmHg (02/02 0328) Pulse Rate: 108 (02/02 0328)  Labs:  Recent Labs  02/24/15 0849 02/25/15 0543 02/25/15 1131 02/26/15 0556  HGB 10.8*  --  10.8* 11.0*  HCT 32.8*  --  32.8* 33.3*  PLT 221  --  261 300  LABPROT 65.2* 18.6*  --  18.5*  INR 8.20* 1.55  --  1.54  CREATININE 1.26*  --  1.28* 1.25*    Estimated Creatinine Clearance: 56.3 mL/min (by C-G formula based on Cr of 1.25).   Assessment: Bobby Jacobs is a 75yo male admitted for weakness and cough. Pt on warfarin as outpatient for Afib. OP regimen is warfarin 7.5mg  PO QHS. Patient's INR on admit was 3.  1/27 admit INR 3 - warfarin 7.5 mg given No INR 1/28-1/29 - warfarin 7.5 mg given each of these days 1/30 INR 5.59. Supratherapeutic. Hold dose and recheck INR with AM labs. 1/31 INR 8.2. Supratherapeutic. Patient received 10mg  IV Vitamin K x1 at 1130 this morning. Follow up and recheck INR with AM labs. 2/1 INR 1.55. Subtherapeutic - warfarin 7.5 mg given 2/2 INR 1.54  Goal of Therapy:  INR 2-3 Monitor platelets by anticoagulation protocol: Yes   Plan:  INR remains subtherapeutic and unchanged (slight decrease to 1.54). Will continue home dose of 7.5mg  PO x1 for tonight. Recheck INR with AM labs. Will need to watch INR closely as pt became supratherapeutic on his home dose but will continue home dose for today as INR is low.   Thank you for including pharmacy in this patient's care. Pharmacy will continue to monitor.  Rayna Sexton, PharmD, BCPS Clinical Pharmacist 02/26/2015 8:53 AM

## 2015-02-26 NOTE — Progress Notes (Signed)
Pt. Still moaning with rib pain after motrin administered. Dr. Jannifer Franklin notified and 0.5 dilaudid ordered.

## 2015-02-26 NOTE — Progress Notes (Signed)
[place] at [facility]                                                                                                                                                                                            Patient Demographics   Bobby Jacobs, is a 75 y.o. male, DOB - 02-13-1940, [id number]  Admit date - 02/20/2015   Admitting Physician Hillary Bow, MD  Outpatient Primary MD for the patient is [name]., PA   LOS - 6  Subjective:   Continues to have fever. Blood cultures 2 negative  Review of Systems:   CONSTITUTIONAL: Positive for fever unable to provide much history   Vitals:   Filed Vitals:   02/25/15 2045 02/26/15 0328 02/26/15 0754 02/26/15 0849  BP: 148/76 154/65  170/90  Pulse:  108  137  Temp: 99.9 F (37.7 C) 100 F (37.8 C)  98.7 F (37.1 C)  TempSrc: Axillary Axillary  Oral  Resp:  18  20  Height:      Weight:      SpO2:  96% 96% 99%    Wt Readings from Last 3 Encounters:  02/20/15 88.451 kg (195 lb)  02/12/15 86.183 kg (190 lb)  02/10/15 86.183 kg (190 lb)     Intake/Output Summary (Last 24 hours) at 02/26/15 1200 Last data filed at 02/26/15 0731  Gross per 24 hour  Intake    480 ml  Output   1850 ml  Net  -1370 ml    Physical Exam:   GENERAL: Chronically ill-appearing HEAD, EYES, EARS, NOSE AND THROAT: Atraumatic, normocephalic. . Pupils equal and reactive to light. Sclerae anicteric. No conjunctival injection. No oro-pharyngeal erythema.  NECK: Supple. There is no jugular venous distention. No bruits, no lymphadenopathy, no thyromegaly.  HEART: Regular rate and rhythm,. No murmurs, no rubs, no clicks.  LUNGS: Diminished breath rales rhonchi wheezing ABDOMEN: Soft, flat, nontender, nondistended. Has good bowel sounds. No hepatosplenomegaly appreciated.  EXTREMITIES: No evidence of any cyanosis, clubbing, or peripheral edema.  +2 pedal and radial pulses bilaterally.  NEUROLOGIC: Limited  exam due to patient unable to follow commands SKIN: Moist and warm with no rashes appreciated.  Psych: Anxious to depressed LN: No inguinal LN enlargement    Antibiotics   Anti-infectives    Start     Dose/Rate Route Frequency Ordered Stop   02/23/15 1400  clindamycin (CLEOCIN) IVPB 300 mg  Status:  Discontinued     300 mg 100 mL/hr over 30 Minutes Intravenous 3 times per day 02/23/15 1233 02/24/15 1407   02/23/15 1300  cefTRIAXone (ROCEPHIN) 1 g in  dextrose 5 % 50 mL IVPB     1 g 100 mL/hr over 30 Minutes Intravenous Every 24 hours 02/23/15 1233     02/21/15 2130  vancomycin (VANCOCIN) 1,250 mg in sodium chloride 0.9 % 250 mL IVPB  Status:  Discontinued     1,250 mg 166.7 mL/hr over 90 Minutes Intravenous Every 18 hours 02/21/15 1224 02/21/15 1239   02/21/15 1930  vancomycin (VANCOCIN) IVPB 750 mg/150 ml premix  Status:  Discontinued     750 mg 150 mL/hr over 60 Minutes Intravenous Every 12 hours 02/21/15 1239 02/23/15 1232   02/21/15 1300  meropenem (MERREM) 1 g in sodium chloride 0.9 % 100 mL IVPB  Status:  Discontinued     1 g 200 mL/hr over 30 Minutes Intravenous Every 12 hours 02/21/15 1208 02/23/15 1232   02/21/15 1245  vancomycin (VANCOCIN) IVPB 1000 mg/200 mL premix     1,000 mg 200 mL/hr over 60 Minutes Intravenous  Once 02/21/15 1239 02/21/15 1443   02/21/15 1230  vancomycin (VANCOCIN) 1,500 mg in sodium chloride 0.9 % 500 mL IVPB  Status:  Discontinued     1,500 mg 250 mL/hr over 120 Minutes Intravenous  Once 02/21/15 1224 02/21/15 1239   02/20/15 2200  piperacillin-tazobactam (ZOSYN) IVPB 3.375 g  Status:  Discontinued     3.375 g 12.5 mL/hr over 240 Minutes Intravenous 3 times per day 02/20/15 2024 02/21/15 1208   02/20/15 1515  levofloxacin (LEVAQUIN) IVPB 500 mg     500 mg 100 mL/hr over 60 Minutes Intravenous  Once 02/20/15 1511 02/20/15 1610      Medications   Scheduled Meds: . amLODipine  10 mg Oral Daily  . atorvastatin  80 mg Oral QHS  .  calcium-vitamin D  1 tablet Oral BID  . carbamide peroxide  2 drop Both Ears Once per day on Mon Thu  . cefTRIAXone (ROCEPHIN)  IV  1 g Intravenous Q24H  . furosemide  20 mg Intravenous Q12H  . insulin aspart  0-5 Units Subcutaneous QHS  . insulin aspart  0-9 Units Subcutaneous TID WC  . insulin glargine  50 Units Subcutaneous Daily  . ipratropium-albuterol  3 mL Nebulization TID  . losartan  100 mg Oral Daily  . metoprolol  75 mg Oral BID  . multivitamin-lutein  1 capsule Oral BID  . nystatin   Topical BID  . polyethylene glycol  17 g Oral BID  . psyllium  1 packet Oral TID  . QUEtiapine  50 mg Oral QHS  . traZODone  150 mg Oral QHS  . vitamin B-12  1,000 mcg Oral Daily  . warfarin  7.5 mg Oral ONCE-1800  . Warfarin - Pharmacist Dosing Inpatient   Does not apply q1800   Continuous Infusions:   PRN Meds:.acetaminophen **OR** acetaminophen, alum & mag hydroxide-simeth, bisacodyl, HYDROmorphone (DILAUDID) injection, ibuprofen, ondansetron **OR** ondansetron (ZOFRAN) IV, senna-docusate   Data Review:   Micro Results Recent Results (from the past 240 hour(s))  Culture, blood (routine x 2)     Status: None   Collection Time: 02/20/15  2:39 PM  Result Value Ref Range Status   Specimen Description BLOOD RIGHT HAND  Final   Special Requests BOTTLES DRAWN AEROBIC AND ANAEROBIC  1CC  Final   Culture NO GROWTH 6 DAYS  Final   Report Status 02/26/2015 FINAL  Final  Culture, blood (routine x 2)     Status: None   Collection Time: 02/20/15  2:39 PM  Result Value Ref Range  Status   Specimen Description BLOOD LEFT ANTECUBITAL  Final   Special Requests BOTTLES DRAWN AEROBIC AND ANAEROBIC  1CC  Final   Culture NO GROWTH 6 DAYS  Final   Report Status 02/26/2015 FINAL  Final  Urine culture     Status: None   Collection Time: 02/20/15  4:00 PM  Result Value Ref Range Status   Specimen Description URINE, RANDOM  Final   Special Requests NONE  Final   Culture >=100,000 COLONIES/mL PROTEUS  MIRABILIS  Final   Report Status 02/22/2015 FINAL  Final   Organism ID, Bacteria PROTEUS MIRABILIS  Final      Susceptibility   Proteus mirabilis - MIC*    AMPICILLIN >=32 RESISTANT Resistant     CEFAZOLIN 8 SENSITIVE Sensitive     CEFTRIAXONE <=1 SENSITIVE Sensitive     CIPROFLOXACIN >=4 RESISTANT Resistant     GENTAMICIN >=16 RESISTANT Resistant     IMIPENEM 2 SENSITIVE Sensitive     NITROFURANTOIN 128 RESISTANT Resistant     TRIMETH/SULFA >=320 RESISTANT Resistant     AMPICILLIN/SULBACTAM >=32 RESISTANT Resistant     PIP/TAZO <=4 SENSITIVE Sensitive     * >=100,000 COLONIES/mL PROTEUS MIRABILIS  Rapid Influenza A&B Antigens ([facility] only)     Status: None   Collection Time: 02/20/15  5:20 PM  Result Value Ref Range Status   Influenza A ([place]) NEGATIVE  Final   Influenza B ([facility]) NEGATIVE  Final  MRSA PCR Screening     Status: None   Collection Time: 02/20/15  8:55 PM  Result Value Ref Range Status   MRSA by PCR NEGATIVE NEGATIVE Final    Comment:        The GeneXpert MRSA Assay (FDA approved for NASAL specimens only), is one component of a comprehensive MRSA colonization surveillance program. It is not intended to diagnose MRSA infection nor to guide or monitor treatment for MRSA infections.   CULTURE, BLOOD (ROUTINE X 2) w Reflex to PCR ID Panel     Status: None (Preliminary result)   Collection Time: 02/25/15  2:29 PM  Result Value Ref Range Status   Specimen Description BLOOD RIGHT ASSIST CONTROL  Final   Special Requests   Final    BOTTLES DRAWN AEROBIC AND ANAEROBIC AERO 3CC ANA 5CC   Culture NO GROWTH < 24 HOURS  Final   Report Status PENDING  Incomplete  CULTURE, BLOOD (ROUTINE X 2) w Reflex to PCR ID Panel     Status: None (Preliminary result)   Collection Time: 02/25/15  2:29 PM  Result Value Ref Range Status   Specimen Description BLOOD LEFT ASSIST CONTROL  Final   Special Requests BOTTLES DRAWN AEROBIC AND ANAEROBIC 3CC  Final   Culture NO GROWTH < 24 HOURS   Final   Report Status PENDING  Incomplete    Radiology Reports Dg Chest 1 View  02/24/2015  CLINICAL DATA:  Short of breath EXAM: CHEST 1 VIEW COMPARISON:  02/20/2015 FINDINGS: Cardiac enlargement. Progression of mild interstitial edema. No pleural effusion. Mild bibasilar atelectasis. Right rib fractures noted. IMPRESSION: Progression of mild bilateral airspace disease consistent with interstitial edema. Electronically Signed   By: Franchot Gallo M.D.   On: 02/24/2015 07:15   Dg Chest 1 View  02/20/2015  CLINICAL DATA:  Mildly productive cough and wheezing for the past 2 days ; EXAM: CHEST 1 VIEW PA with one view lateral chest x-ray. The images are duplicated in PACs. The PA was performed at the [facility] outpatient facility  but the patient then faint it and was transported to a [facility] where the lateral was performed. COMPARISON:  PA and lateral chest x-ray of November 05, 2014 FINDINGS: The lungs are adequately inflated. There is mildly increased density in the left lower lobe posteriorly. There is no pleural effusion. The heart is top-normal in size. The pulmonary vascularity is normal. The 7 sternal wires are intact. 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 There is calcification in the wall of the thoracic aorta. The bony thorax exhibits no acute abnormality. IMPRESSION: Subsegmental atelectasis or early pneumonia in the left lower lobe. There is no evidence of pulmonary edema. Followup PA and lateral chest X-ray is recommended in 3-4 weeks following trial of antibiotic therapy to ensure resolution and exclude underlying malignancy. Electronically Signed   By: David  Martinique M.D.   On: 02/20/2015 12:05   Dg Chest 1 View  02/20/2015  CLINICAL DATA:  Mildly productive cough and wheezing for the past 2 days ; EXAM: CHEST 1 VIEW PA with one view lateral chest x-ray. The images are duplicated in PACs. The PA was performed at the [facility] outpatient facility but the patient then faint it and was  transported to a [facility] where the lateral was performed. COMPARISON:  PA and lateral chest x-ray of November 05, 2014 FINDINGS: The lungs are adequately inflated. There is mildly increased density in the left lower lobe posteriorly. There is no pleural effusion. The heart is top-normal in size. The pulmonary vascularity is normal. The 7 sternal wires are intact. 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 There is calcification in the wall of the thoracic aorta. The bony thorax exhibits no acute abnormality. IMPRESSION: Subsegmental atelectasis or early pneumonia in the left lower lobe. There is no evidence of pulmonary edema. Followup PA and lateral chest X-ray is recommended in 3-4 weeks following trial of antibiotic therapy to ensure resolution and exclude underlying malignancy. Electronically Signed   By: David  Martinique M.D.   On: 02/20/2015 12:05   Dg Ribs Unilateral Right  02/20/2015  CLINICAL DATA:  Right-sided rib pain following fall, initial encounter EXAM: RIGHT RIBS - 2 VIEW COMPARISON:  None. FINDINGS: Mildly displaced fractures of the eighth, ninth and tenth ribs are noted on the right posteriorly. No other fractures are seen. No underlying pneumothorax is noted. IMPRESSION: Multiple rib fractures posteriorly on the right consistent with the recent injury. No pneumothorax noted. Electronically Signed   By: Inez Catalina M.D.   On: 02/20/2015 12:32   Ct Head Wo Contrast  02/24/2015  CLINICAL DATA:  Change in pt status, decreased responses, delayed responses. Pinpoint pupils with sluggish response to light. Irregular pulse wit hx. of a-fib. Cool, clammy skin. BG 164. VS WNL. Pt has been on/off CPaP throughout the day due to drowsiness. EXAM: CT HEAD WITHOUT CONTRAST TECHNIQUE: Contiguous axial images were obtained from the base of the skull through the vertex without intravenous contrast. COMPARISON:  02/20/2015 FINDINGS: The ventricles are enlarged, to a greater degree than the sulci, but stable when  compared to the prior exam. Overall atrophy is advanced for age. Encephalomalacia seen along the posterior medial right parietal and adjacent occipital lobe reflecting an old infarct, stable. There is no evidence of a recent cortical infarct. There are no parenchymal masses or mass effect. Periventricular white matter hypoattenuation is noted consistent with mild chronic microvascular ischemic change. There are no extra-axial masses or abnormal fluid collections. There is no intracranial hemorrhage. Visualized sinuses and mastoid air cells are clear.  IMPRESSION: 1. No acute intracranial abnormalities. 2. Advanced atrophy.  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 Electronically Signed   By: Lajean Manes M.D.   On: 02/24/2015 17:43   Ct Head Wo Contrast  02/20/2015  CLINICAL DATA:  Fall today, on Coumadin.  History of stroke. EXAM: CT HEAD WITHOUT CONTRAST TECHNIQUE: Contiguous axial images were obtained from the base of the skull through the vertex without intravenous contrast. COMPARISON:  08/03/2013 FINDINGS: There is atrophy and chronic small vessel disease changes. Old right occipital infarct. No acute intracranial abnormality. Specifically, no hemorrhage, hydrocephalus, mass lesion, acute infarction, or significant intracranial injury. No acute calvarial abnormality. Visualized paranasal sinuses and mastoids clear. Orbital soft tissues unremarkable. IMPRESSION: Old right occipital infarct. No acute intracranial abnormality. Atrophy, chronic microvascular disease. Electronically Signed   By: Rolm Baptise M.D.   On: 02/20/2015 16:46   Ct Chest W Contrast  02/24/2015  CLINICAL DATA:  Follow-up chest x-ray.  Short breath EXAM: CT CHEST WITH CONTRAST TECHNIQUE: Multidetector CT imaging of the chest was performed during intravenous contrast administration. CONTRAST:  20mL OMNIPAQUE IOHEXOL 300 MG/ML  SOLN COMPARISON:  Chest CT 08/10/2011 FINDINGS: Mediastinum/Nodes: No axillary supraclavicular lymphadenopathy. No  axillary adenopathy. The thyroid gland is enlarged coarse calcifications. Mild mediastinal hilar adenopathy is present. 12 mm RIGHT lower paratracheal lymph node for example. 10 mm RIGHT hilar lymph node on image 26 series Lungs/Pleura: Small bilateral pleural effusions with passive atelectasis in the lower lobes. No discrete nodularity. No airspace disease. No pneumothorax. Upper abdomen: Limited view of the liver, kidneys, pancreas are unremarkable. Normal adrenal glands. Musculoskeletal: No aggressive osseous lesion. There is fractures of the posterior lateral sixth through tenth ribs. Several the lower lower ribs fractures are mildly displaced (image 44, series). No pneumothorax with IMPRESSION: 1. Multiple Posterior RIGHT rib fractures (sixth through tenth ribs). No pneumothorax 2. Mild mediastinal adenopathy likely reactive. 3. Small bilateral pleural effusions and passive atelectasis. 4. Nodular from thyroid gland likely represents a benign goiter Electronically Signed   By: Suzy Bouchard M.D.   On: 02/24/2015 09:48   Dg Chest Port 1 View  02/20/2015  CLINICAL DATA:  Status post fall today with multiple right rib fractures. Initial encounter. EXAM: PORTABLE CHEST 1 VIEW COMPARISON:  PA and lateral chest and dedicated plain films of the ribs earlier today. FINDINGS: There is no pneumothorax. The lungs are clear. Heart size is enlarged. Right rib fractures are not as well seen on this study as on dedicated plain films of the ribs. IMPRESSION: Negative for pneumothorax. Right rib fractures are better demonstrated on plain films earlier today. Cardiomegaly. Electronically Signed   By: Inge Rise M.D.   On: 02/20/2015 17:47     CBC  Recent Labs Lab 02/20/15 1439  02/22/15 0335 02/23/15 0408 02/24/15 0849 02/25/15 1131 02/26/15 0556  WBC 14.1*  < > 9.7 10.3 12.9* 12.1* 14.1*  HGB 12.4*  < > 10.6* 10.8* 10.8* 10.8* 11.0*  HCT 37.2*  < > 31.5* 32.0* 32.8* 32.8* 33.3*  PLT 185  < > 173 193  221 261 300  MCV 83.8  < > 85.5 84.4 84.1 85.2 85.0  MCH 27.9  < > 28.7 28.4 27.7 27.9 28.2  MCHC 33.3  < > 33.5 33.7 33.0 32.7 33.1  RDW 15.3*  < > 15.2* 15.3* 15.1* 15.4* 15.2*  LYMPHSABS 0.6*  --   --   --   --  0.8*  --   MONOABS 1.3*  --   --   --   --  1.0  --   EOSABS 0.0  --   --   --   --  0.2  --   BASOSABS 0.0  --   --   --   --  0.1  --   < > = values in this interval not displayed.  Chemistries   Recent Labs Lab 02/20/15 1551  02/22/15 0335 02/23/15 0408 02/24/15 0849 02/25/15 1131 02/26/15 0556  NA 128*  < > 135 138 133* 137 138  K 4.3  < > 4.0 4.1 3.8 4.0 3.7  CL 96*  < > 102 105 103 101 104  CO2 24  < > 25 27 26 28 28   GLUCOSE 191*  < > 169* 127* 141* 315* 180*  BUN 39*  < > 31* 28* 24* 29* 30*  CREATININE 1.70*  < > 1.62* 1.22 1.26* 1.28* 1.25*  CALCIUM 8.3*  < > 8.3* 8.2* 7.8* 8.3* 8.3*  AST 23  --   --   --   --   --   --   ALT 19  --   --   --   --   --   --   ALKPHOS 76  --   --   --   --   --   --   BILITOT 1.1  --   --   --   --   --   --   < > = values in this interval not displayed. ------------------------------------------------------------------------------------------------------------------ estimated creatinine clearance is 56.3 mL/min (by C-G formula based on Cr of 1.25). ------------------------------------------------------------------------------------------------------------------ No results for input(s): HGBA1C in the last 72 hours. ------------------------------------------------------------------------------------------------------------------ No results for input(s): CHOL, HDL, LDLCALC, TRIG, CHOLHDL, LDLDIRECT in the last 72 hours. ------------------------------------------------------------------------------------------------------------------ No results for input(s): TSH, T4TOTAL, T3FREE, THYROIDAB in the last 72 hours.  Invalid input(s):  FREET3 ------------------------------------------------------------------------------------------------------------------ No results for input(s): VITAMINB12, FOLATE, FERRITIN, TIBC, IRON, RETICCTPCT in the last 72 hours.  Coagulation profile  Recent Labs Lab 02/20/15 1439 02/23/15 0408 02/24/15 0849 02/25/15 0543 02/26/15 0556  INR 3.00 5.59* 8.20* 1.55 1.54    No results for input(s): DDIMER in the last 72 hours.  Cardiac Enzymes No results for input(s): CKMB, TROPONINI, MYOGLOBIN in the last 168 hours.  Invalid input(s): CK ------------------------------------------------------------------------------------------------------------------ Invalid input(s): POCBNP    Assessment & Plan   75 year male with dementia and atrial fibrillation on anticoagulation who initially presented for an outpatient chest x-ray for cough and subsequently fell due to weakness and now separated rib fracture.  1. Fever: CT scan of the chest shows no evidence of pneumonia, fever persist, Continue ceftriaxone, UA still abnormal , obtain a CT scan of the abdomen  2. Rib fracture: Incentive spirometry if patient able to do 3. Dementia: Observe patient for signs of delirium. Continue Seroquel and trazodone 4. Diabetes: Continue sliding scale insulin and Lantus add pre-meal insulin 5. Atrial fibrillation -INR intended on just Coumadin  6. Essential hypertension: Continue metoprolol, Norvasc and losartan   Patient was DO NOT RESUSCITATE his wife changed him to full code I will ask palliative care team to currently full code  Code Status History    Date Active Date Inactive Code Status Order ID Comments User Context   02/20/2015  8:18 PM 02/20/2015  8:19 PM DNR [id number]  Bettey Costa, MD Inpatient   02/20/2015  7:55 PM 02/20/2015  8:18 PM Full Code [id number]  Bettey Costa, MD ED   12/12/2014  9:38 AM 12/13/2014  3:23 AM Full Code [id number]  Inez Catalina, MD HOV    Questions for Most Recent Historical Code  Status (Order [id number])    Question Answer Comment   In the event of cardiac or respiratory ARREST Do not call a "code blue"    In the event of cardiac or respiratory ARREST Do not perform Intubation, CPR, defibrillation or ACLS    In the event of cardiac or respiratory ARREST Use medication by any route, position, wound care, and other measures to relive pain and suffering. May use oxygen, suction and manual treatment of airway obstruction as needed for comfort.     Advance Directive Documentation        Most Recent Value   Type of Advance Directive  Healthcare Power of Attorney   Pre-existing out of facility DNR order (yellow form or pink MOST form)     "MOST" Form in Place?             Consults  none  DVT Prophylaxis  coumadin  Lab Results  Component Value Date   PLT 300 02/26/2015     Time Spent in minutes  70min   Tiwanda Threats M.D on 02/26/2015 at 12:00 PM  Between 7am to 6pm - Pager - [phone]  After 6pm go to [email or web address] - password [email or web address] [facility]   Office  [phone]

## 2015-02-26 NOTE — Care Management (Signed)
Patient does not meet criteria for LTAC.

## 2015-02-26 NOTE — Progress Notes (Signed)
Per MD patient is not medically stable for D/C today. Clinical Education officer, museum (CSW) met with patient's wife at bedside and made her aware of above. CSW contacted Bill admissions coordinator at [facility] and made him aware of above. CSW will continue to follow and assist as needed.   Blima Rich, LCSW [phone]

## 2015-02-27 LAB — BASIC METABOLIC PANEL
Anion gap: 6 (ref 5–15)
BUN: 30 mg/dL — AB (ref 6–20)
CHLORIDE: 105 mmol/L (ref 101–111)
CO2: 31 mmol/L (ref 22–32)
Calcium: 8.6 mg/dL — ABNORMAL LOW (ref 8.9–10.3)
Creatinine, Ser: 1.16 mg/dL (ref 0.61–1.24)
GFR calc Af Amer: >60 mL/min (ref >60)
GFR calc non Af Amer: 59 mL/min — ABNORMAL LOW (ref >60)
Glucose, Bld: 195 mg/dL — ABNORMAL HIGH (ref 65–99)
POTASSIUM: 3.5 mmol/L (ref 3.5–5.1)
SODIUM: 142 mmol/L (ref 135–145)

## 2015-02-27 LAB — GLUCOSE, CAPILLARY
GLUCOSE-CAPILLARY: 147 mg/dL — AB (ref 65–99)
GLUCOSE-CAPILLARY: 266 mg/dL — AB (ref 65–99)
Glucose-Capillary: 333 mg/dL — ABNORMAL HIGH (ref 65–99)
Glucose-Capillary: 297 mg/dL — ABNORMAL HIGH (ref 65–99)

## 2015-02-27 LAB — CBC
HCT: 33.1 % — ABNORMAL LOW (ref 40.0–52.0)
HEMOGLOBIN: 10.9 g/dL — AB (ref 13.0–18.0)
MCH: 27.9 pg (ref 26.0–34.0)
MCHC: 33 g/dL (ref 32.0–36.0)
MCV: 84.6 fL (ref 80.0–100.0)
Platelets: 321 10*3/uL (ref 150–440)
RBC: 3.91 MIL/uL — AB (ref 4.40–5.90)
RDW: 15.4 % — ABNORMAL HIGH (ref 11.5–14.5)
WBC: 11.2 10*3/uL — ABNORMAL HIGH (ref 3.8–10.6)

## 2015-02-27 LAB — PROTIME-INR
INR: 2.12
PROTHROMBIN TIME: 23.6 s — AB (ref 11.4–15.0)

## 2015-02-27 MED ORDER — WARFARIN SODIUM 5 MG PO TABS
6.5000 mg | ORAL_TABLET | Freq: Once | ORAL | Status: AC
  Administered 2015-02-27: 6.5 mg via ORAL
  Filled 2015-02-27: qty 1

## 2015-02-27 MED ORDER — DOCUSATE SODIUM 100 MG PO CAPS
200.0000 mg | ORAL_CAPSULE | Freq: Two times a day (BID) | ORAL | Status: DC
  Administered 2015-02-27 – 2015-03-02 (×6): 200 mg via ORAL
  Filled 2015-02-27 (×6): qty 2

## 2015-02-27 MED ORDER — FLEET ENEMA 7-19 GM/118ML RE ENEM: 1.0000 | ENEMA | Freq: Once | RECTAL | Status: DC

## 2015-02-27 MED ORDER — SENNOSIDES 8.8 MG/5ML PO SYRP
10.0000 mL | ORAL_SOLUTION | Freq: Two times a day (BID) | ORAL | Status: DC
  Administered 2015-02-28 – 2015-03-02 (×4): 10 mL via ORAL
  Filled 2015-02-27 (×8): qty 10

## 2015-02-27 NOTE — Care Management Important Message (Signed)
Important Message  Patient Details  Name: Bobby Jacobs MRN: [id number] Date of Birth: [birth year]   Medicare Important Message Given:  Yes    Juliann Pulse A Kyrie Bun 02/27/2015, 10:33 AM

## 2015-02-27 NOTE — Consult Note (Signed)
ANTICOAGULATION CONSULT NOTE - Follow Up  Pharmacy Consult for warfarin Indication: atrial fibrillation  No Known Allergies  Patient Measurements: Height: 5\' 10"  (177.8 cm) Weight: 195 lb (88.451 kg) IBW/kg (Calculated) : 73  Vital Signs: Temp: 97.5 F (36.4 C) (02/03 0721) Temp Source: Axillary (02/03 0721) BP: 151/88 mmHg (02/03 0721) Pulse Rate: 97 (02/03 0721)  Labs:  Recent Labs  02/25/15 0543 02/25/15 1131 02/26/15 0556 02/27/15 0519  HGB  --  10.8* 11.0* 10.9*  HCT  --  32.8* 33.3* 33.1*  PLT  --  261 300 321  LABPROT 18.6*  --  18.5* 23.6*  INR 1.55  --  1.54 2.12  CREATININE  --  1.28* 1.25* 1.16    Estimated Creatinine Clearance: 60.7 mL/min (by C-G formula based on Cr of 1.16).   Assessment: LA is a 75yo male admitted for weakness and cough. Pt on warfarin as outpatient for Afib. OP regimen is warfarin 7.5mg  PO QHS. Patient's INR on admit was 3.  1/27 admit INR 3 - warfarin 7.5 mg given No INR 1/28-1/29 - warfarin 7.5 mg given each of these days 1/30 INR 5.59. Supratherapeutic. Hold dose and recheck INR with AM labs. 1/31 INR 8.2. Supratherapeutic. Patient received 10mg  IV Vitamin K x1 at 1130 this morning. Follow up and recheck INR with AM labs. 2/1 INR 1.55. Subtherapeutic - warfarin 7.5 mg given 2/2 INR 1.54 2/3 INR 2.12  Goal of Therapy:  INR 2-3 Monitor platelets by anticoagulation protocol: Yes   Plan:  INR is therapeutic after large overnight jump. Will decrease by ~15% of weekly dose, giving patient new regimen of warfarin 6.5mg  PO QHS. Recheck INR with AM labs.  Thank you for including pharmacy in this patient's care. Pharmacy will continue to monitor.  Roe Coombs, PharmD Pharmacy Resident 02/27/2015

## 2015-02-27 NOTE — Progress Notes (Addendum)
[place] at [facility]                                                                                                                                                                                            Patient Demographics   Bobby Jacobs, is a 75 y.o. male, DOB - 11-23-1940, [id number]  Admit date - 02/20/2015   Admitting Physician Hillary Bow, MD  Outpatient Primary MD for the patient is [name]., PA   LOS - 7  Subjective:   Patient had a CT scan of the abdomen which shows severe constipation  Review of Systems:   CONSTITUTIONAL: Unable to provide   Vitals:   Filed Vitals:   02/26/15 2044 02/26/15 2249 02/27/15 0421 02/27/15 0721  BP:   157/95 151/88  Pulse:   89 97  Temp:   97.7 F (36.5 C) 97.5 F (36.4 C)  TempSrc:   Oral Axillary  Resp:   20 20  Height:      Weight:      SpO2: 95% 96% 100% 97%    Wt Readings from Last 3 Encounters:  02/20/15 88.451 kg (195 lb)  02/12/15 86.183 kg (190 lb)  02/10/15 86.183 kg (190 lb)     Intake/Output Summary (Last 24 hours) at 02/27/15 1106 Last data filed at 02/27/15 0421  Gross per 24 hour  Intake    360 ml  Output   2525 ml  Net  -2165 ml    Physical Exam:   GENERAL: Chronically ill-appearing HEAD, EYES, EARS, NOSE AND THROAT: Atraumatic, normocephalic. . Pupils equal and reactive to light. Sclerae anicteric. No conjunctival injection. No oro-pharyngeal erythema.  NECK: Supple. There is no jugular venous distention. No bruits, no lymphadenopathy, no thyromegaly.  HEART: Regular rate and rhythm,. No murmurs, no rubs, no clicks.  LUNGS: Diminished breath rales rhonchi wheezing ABDOMEN: Soft, flat, nontender, nondistended. Has good bowel sounds. No hepatosplenomegaly appreciated.  EXTREMITIES: No evidence of any cyanosis, clubbing, or peripheral edema.  +2 pedal and radial pulses bilaterally.  NEUROLOGIC: Limited exam due to patient unable to follow  commands SKIN: Moist and warm with no rashes appreciated.  Psych: Anxious to depressed LN: No inguinal LN enlargement    Antibiotics   Anti-infectives    Start     Dose/Rate Route Frequency Ordered Stop   02/23/15 1400  clindamycin (CLEOCIN) IVPB 300 mg  Status:  Discontinued     300 mg 100 mL/hr over 30 Minutes Intravenous 3 times per day 02/23/15 1233 02/24/15 1407   02/23/15 1300  cefTRIAXone (ROCEPHIN) 1 g in dextrose 5 % 50  mL IVPB     1 g 100 mL/hr over 30 Minutes Intravenous Every 24 hours 02/23/15 1233     02/21/15 2130  vancomycin (VANCOCIN) 1,250 mg in sodium chloride 0.9 % 250 mL IVPB  Status:  Discontinued     1,250 mg 166.7 mL/hr over 90 Minutes Intravenous Every 18 hours 02/21/15 1224 02/21/15 1239   02/21/15 1930  vancomycin (VANCOCIN) IVPB 750 mg/150 ml premix  Status:  Discontinued     750 mg 150 mL/hr over 60 Minutes Intravenous Every 12 hours 02/21/15 1239 02/23/15 1232   02/21/15 1300  meropenem (MERREM) 1 g in sodium chloride 0.9 % 100 mL IVPB  Status:  Discontinued     1 g 200 mL/hr over 30 Minutes Intravenous Every 12 hours 02/21/15 1208 02/23/15 1232   02/21/15 1245  vancomycin (VANCOCIN) IVPB 1000 mg/200 mL premix     1,000 mg 200 mL/hr over 60 Minutes Intravenous  Once 02/21/15 1239 02/21/15 1443   02/21/15 1230  vancomycin (VANCOCIN) 1,500 mg in sodium chloride 0.9 % 500 mL IVPB  Status:  Discontinued     1,500 mg 250 mL/hr over 120 Minutes Intravenous  Once 02/21/15 1224 02/21/15 1239   02/20/15 2200  piperacillin-tazobactam (ZOSYN) IVPB 3.375 g  Status:  Discontinued     3.375 g 12.5 mL/hr over 240 Minutes Intravenous 3 times per day 02/20/15 2024 02/21/15 1208   02/20/15 1515  levofloxacin (LEVAQUIN) IVPB 500 mg     500 mg 100 mL/hr over 60 Minutes Intravenous  Once 02/20/15 1511 02/20/15 1610      Medications   Scheduled Meds: . amLODipine  10 mg Oral Daily  . atorvastatin  80 mg Oral QHS  . calcium-vitamin D  1 tablet Oral BID  . carbamide  peroxide  2 drop Both Ears Once per day on Mon Thu  . cefTRIAXone (ROCEPHIN)  IV  1 g Intravenous Q24H  . docusate sodium  200 mg Oral BID  . furosemide  20 mg Intravenous Q12H  . insulin aspart  0-5 Units Subcutaneous QHS  . insulin aspart  0-9 Units Subcutaneous TID WC  . insulin aspart  4 Units Subcutaneous TID WC  . insulin glargine  50 Units Subcutaneous Daily  . ipratropium-albuterol  3 mL Nebulization TID  . losartan  100 mg Oral Daily  . metoprolol  75 mg Oral BID  . multivitamin-lutein  1 capsule Oral BID  . nystatin   Topical BID  . polyethylene glycol  17 g Oral BID  . psyllium  1 packet Oral TID  . QUEtiapine  50 mg Oral QHS  . sennosides  10 mL Oral BID  . sodium phosphate  1 enema Rectal Once  . traZODone  150 mg Oral QHS  . vitamin B-12  1,000 mcg Oral Daily  . warfarin  6.5 mg Oral ONCE-1800  . Warfarin - Pharmacist Dosing Inpatient   Does not apply q1800   Continuous Infusions:   PRN Meds:.acetaminophen **OR** acetaminophen, alum & mag hydroxide-simeth, bisacodyl, ibuprofen, ondansetron **OR** ondansetron (ZOFRAN) IV, senna-docusate   Data Review:   Micro Results Recent Results (from the past 240 hour(s))  Culture, blood (routine x 2)     Status: None   Collection Time: 02/20/15  2:39 PM  Result Value Ref Range Status   Specimen Description BLOOD RIGHT HAND  Final   Special Requests BOTTLES DRAWN AEROBIC AND ANAEROBIC  1CC  Final   Culture NO GROWTH 6 DAYS  Final   Report Status  02/26/2015 FINAL  Final  Culture, blood (routine x 2)     Status: None   Collection Time: 02/20/15  2:39 PM  Result Value Ref Range Status   Specimen Description BLOOD LEFT ANTECUBITAL  Final   Special Requests BOTTLES DRAWN AEROBIC AND ANAEROBIC  1CC  Final   Culture NO GROWTH 6 DAYS  Final   Report Status 02/26/2015 FINAL  Final  Urine culture     Status: None   Collection Time: 02/20/15  4:00 PM  Result Value Ref Range Status   Specimen Description URINE, RANDOM  Final    Special Requests NONE  Final   Culture >=100,000 COLONIES/mL PROTEUS MIRABILIS  Final   Report Status 02/22/2015 FINAL  Final   Organism ID, Bacteria PROTEUS MIRABILIS  Final      Susceptibility   Proteus mirabilis - MIC*    AMPICILLIN >=32 RESISTANT Resistant     CEFAZOLIN 8 SENSITIVE Sensitive     CEFTRIAXONE <=1 SENSITIVE Sensitive     CIPROFLOXACIN >=4 RESISTANT Resistant     GENTAMICIN >=16 RESISTANT Resistant     IMIPENEM 2 SENSITIVE Sensitive     NITROFURANTOIN 128 RESISTANT Resistant     TRIMETH/SULFA >=320 RESISTANT Resistant     AMPICILLIN/SULBACTAM >=32 RESISTANT Resistant     PIP/TAZO <=4 SENSITIVE Sensitive     * >=100,000 COLONIES/mL PROTEUS MIRABILIS  Rapid Influenza A&B Antigens ([facility] only)     Status: None   Collection Time: 02/20/15  5:20 PM  Result Value Ref Range Status   Influenza A ([place]) NEGATIVE  Final   Influenza B ([facility]) NEGATIVE  Final  MRSA PCR Screening     Status: None   Collection Time: 02/20/15  8:55 PM  Result Value Ref Range Status   MRSA by PCR NEGATIVE NEGATIVE Final    Comment:        The GeneXpert MRSA Assay (FDA approved for NASAL specimens only), is one component of a comprehensive MRSA colonization surveillance program. It is not intended to diagnose MRSA infection nor to guide or monitor treatment for MRSA infections.   CULTURE, BLOOD (ROUTINE X 2) w Reflex to PCR ID Panel     Status: None (Preliminary result)   Collection Time: 02/25/15  2:29 PM  Result Value Ref Range Status   Specimen Description BLOOD RIGHT ASSIST CONTROL  Final   Special Requests   Final    BOTTLES DRAWN AEROBIC AND ANAEROBIC AERO 3CC ANA 5CC   Culture NO GROWTH 2 DAYS  Final   Report Status PENDING  Incomplete  CULTURE, BLOOD (ROUTINE X 2) w Reflex to PCR ID Panel     Status: None (Preliminary result)   Collection Time: 02/25/15  2:29 PM  Result Value Ref Range Status   Specimen Description BLOOD LEFT ASSIST CONTROL  Final   Special Requests BOTTLES  DRAWN AEROBIC AND ANAEROBIC 3CC  Final   Culture NO GROWTH 2 DAYS  Final   Report Status PENDING  Incomplete    Radiology Reports Dg Chest 1 View  02/24/2015  CLINICAL DATA:  Short of breath EXAM: CHEST 1 VIEW COMPARISON:  02/20/2015 FINDINGS: Cardiac enlargement. Progression of mild interstitial edema. No pleural effusion. Mild bibasilar atelectasis. Right rib fractures noted. IMPRESSION: Progression of mild bilateral airspace disease consistent with interstitial edema. Electronically Signed   By: Franchot Gallo M.D.   On: 02/24/2015 07:15   Dg Chest 1 View  02/20/2015  CLINICAL DATA:  Mildly productive cough and wheezing for the past 2 days ;  EXAM: CHEST 1 VIEW PA with one view lateral chest x-ray. The images are duplicated in PACs. The PA was performed at the [facility] outpatient facility but the patient then faint it and was transported to a [facility] where the lateral was performed. COMPARISON:  PA and lateral chest x-ray of November 05, 2014 FINDINGS: The lungs are adequately inflated. There is mildly increased density in the left lower lobe posteriorly. There is no pleural effusion. The heart is top-normal in size. The pulmonary vascularity is normal. The 7 sternal wires are intact. 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 There is calcification in the wall of the thoracic aorta. The bony thorax exhibits no acute abnormality. IMPRESSION: Subsegmental atelectasis or early pneumonia in the left lower lobe. There is no evidence of pulmonary edema. Followup PA and lateral chest X-ray is recommended in 3-4 weeks following trial of antibiotic therapy to ensure resolution and exclude underlying malignancy. Electronically Signed   By: David  Martinique M.D.   On: 02/20/2015 12:05   Dg Chest 1 View  02/20/2015  CLINICAL DATA:  Mildly productive cough and wheezing for the past 2 days ; EXAM: CHEST 1 VIEW PA with one view lateral chest x-ray. The images are duplicated in PACs. The PA was performed at the  [facility] outpatient facility but the patient then faint it and was transported to a [facility] where the lateral was performed. COMPARISON:  PA and lateral chest x-ray of November 05, 2014 FINDINGS: The lungs are adequately inflated. There is mildly increased density in the left lower lobe posteriorly. There is no pleural effusion. The heart is top-normal in size. The pulmonary vascularity is normal. The 7 sternal wires are intact. 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 There is calcification in the wall of the thoracic aorta. The bony thorax exhibits no acute abnormality. IMPRESSION: Subsegmental atelectasis or early pneumonia in the left lower lobe. There is no evidence of pulmonary edema. Followup PA and lateral chest X-ray is recommended in 3-4 weeks following trial of antibiotic therapy to ensure resolution and exclude underlying malignancy. Electronically Signed   By: David  Martinique M.D.   On: 02/20/2015 12:05   Dg Ribs Unilateral Right  02/20/2015  CLINICAL DATA:  Right-sided rib pain following fall, initial encounter EXAM: RIGHT RIBS - 2 VIEW COMPARISON:  None. FINDINGS: Mildly displaced fractures of the eighth, ninth and tenth ribs are noted on the right posteriorly. No other fractures are seen. No underlying pneumothorax is noted. IMPRESSION: Multiple rib fractures posteriorly on the right consistent with the recent injury. No pneumothorax noted. Electronically Signed   By: Inez Catalina M.D.   On: 02/20/2015 12:32   Ct Head Wo Contrast  02/24/2015  CLINICAL DATA:  Change in pt status, decreased responses, delayed responses. Pinpoint pupils with sluggish response to light. Irregular pulse wit hx. of a-fib. Cool, clammy skin. BG 164. VS WNL. Pt has been on/off CPaP throughout the day due to drowsiness. EXAM: CT HEAD WITHOUT CONTRAST TECHNIQUE: Contiguous axial images were obtained from the base of the skull through the vertex without intravenous contrast. COMPARISON:  02/20/2015 FINDINGS: The ventricles  are enlarged, to a greater degree than the sulci, but stable when compared to the prior exam. Overall atrophy is advanced for age. Encephalomalacia seen along the posterior medial right parietal and adjacent occipital lobe reflecting an old infarct, stable. There is no evidence of a recent cortical infarct. There are no parenchymal masses or mass effect. Periventricular white matter hypoattenuation is noted consistent with mild  chronic microvascular ischemic change. There are no extra-axial masses or abnormal fluid collections. There is no intracranial hemorrhage. Visualized sinuses and mastoid air cells are clear. IMPRESSION: 1. No acute intracranial abnormalities. 2. Advanced atrophy.  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 Electronically Signed   By: Lajean Manes M.D.   On: 02/24/2015 17:43   Ct Head Wo Contrast  02/20/2015  CLINICAL DATA:  Fall today, on Coumadin.  History of stroke. EXAM: CT HEAD WITHOUT CONTRAST TECHNIQUE: Contiguous axial images were obtained from the base of the skull through the vertex without intravenous contrast. COMPARISON:  08/03/2013 FINDINGS: There is atrophy and chronic small vessel disease changes. Old right occipital infarct. No acute intracranial abnormality. Specifically, no hemorrhage, hydrocephalus, mass lesion, acute infarction, or significant intracranial injury. No acute calvarial abnormality. Visualized paranasal sinuses and mastoids clear. Orbital soft tissues unremarkable. IMPRESSION: Old right occipital infarct. No acute intracranial abnormality. Atrophy, chronic microvascular disease. Electronically Signed   By: Rolm Baptise M.D.   On: 02/20/2015 16:46   Ct Chest W Contrast  02/24/2015  CLINICAL DATA:  Follow-up chest x-ray.  Short breath EXAM: CT CHEST WITH CONTRAST TECHNIQUE: Multidetector CT imaging of the chest was performed during intravenous contrast administration. CONTRAST:  47mL OMNIPAQUE IOHEXOL 300 MG/ML  SOLN COMPARISON:  Chest CT 08/10/2011 FINDINGS:  Mediastinum/Nodes: No axillary supraclavicular lymphadenopathy. No axillary adenopathy. The thyroid gland is enlarged coarse calcifications. Mild mediastinal hilar adenopathy is present. 12 mm RIGHT lower paratracheal lymph node for example. 10 mm RIGHT hilar lymph node on image 26 series Lungs/Pleura: Small bilateral pleural effusions with passive atelectasis in the lower lobes. No discrete nodularity. No airspace disease. No pneumothorax. Upper abdomen: Limited view of the liver, kidneys, pancreas are unremarkable. Normal adrenal glands. Musculoskeletal: No aggressive osseous lesion. There is fractures of the posterior lateral sixth through tenth ribs. Several the lower lower ribs fractures are mildly displaced (image 44, series). No pneumothorax with IMPRESSION: 1. Multiple Posterior RIGHT rib fractures (sixth through tenth ribs). No pneumothorax 2. Mild mediastinal adenopathy likely reactive. 3. Small bilateral pleural effusions and passive atelectasis. 4. Nodular from thyroid gland likely represents a benign goiter Electronically Signed   By: Suzy Bouchard M.D.   On: 02/24/2015 09:48   Ct Abdomen Pelvis W Contrast  02/26/2015  CLINICAL DATA:  Generalized abdomen pain EXAM: CT ABDOMEN AND PELVIS WITH CONTRAST TECHNIQUE: Multidetector CT imaging of the abdomen and pelvis was performed using the standard protocol following bolus administration of intravenous contrast. CONTRAST:  158mL OMNIPAQUE IOHEXOL 300 MG/ML  SOLN COMPARISON:  August 04, 2014 FINDINGS: The liver, spleen, pancreas, adrenal glands are normal. The gallbladder is normal. There is a focus of densely opacified colonic diverticulum near the gallbladder unchanged compared to prior exam. There is a 2.5 cm cyst in midpole right kidney. There is no hydronephrosis bilaterally. Mild chronic perinephric stranding are noted. There is infrarenal abdominal aortic aneurysm measuring 4.6 cm unchanged compared to prior exam. The aorta is densely calcified.  There is no abdominal lymphadenopathy. There is no small bowel obstruction or diverticulitis. Extensive bowel content is identified throughout colon. The appendix is not seen but no inflammatory changes noted around cecum to suggest appendicitis. A suprapubic catheter is identified in a decompressed bladder limiting evaluation. There are small bilateral pleural effusions with mild dependent atelectasis of the posterior lung bases. Degenerative joint changes of the spine are noted. IMPRESSION: No acute abnormality identified in the abdomen and pelvis. Extensive bowel content identified throughout colon suggesting constipation. Infrarenal abdominal aortic aneurysm  unchanged compared to prior exam. Small bilateral pleural effusions with dependent atelectasis of posterior lung bases. Electronically Signed   By: Abelardo Diesel M.D.   On: 02/26/2015 16:33   Dg Chest Port 1 View  02/20/2015  CLINICAL DATA:  Status post fall today with multiple right rib fractures. Initial encounter. EXAM: PORTABLE CHEST 1 VIEW COMPARISON:  PA and lateral chest and dedicated plain films of the ribs earlier today. FINDINGS: There is no pneumothorax. The lungs are clear. Heart size is enlarged. Right rib fractures are not as well seen on this study as on dedicated plain films of the ribs. IMPRESSION: Negative for pneumothorax. Right rib fractures are better demonstrated on plain films earlier today. Cardiomegaly. Electronically Signed   By: Inge Rise M.D.   On: 02/20/2015 17:47     CBC  Recent Labs Lab 02/20/15 1439  02/23/15 0408 02/24/15 0849 02/25/15 1131 02/26/15 0556 02/27/15 0519  WBC 14.1*  < > 10.3 12.9* 12.1* 14.1* 11.2*  HGB 12.4*  < > 10.8* 10.8* 10.8* 11.0* 10.9*  HCT 37.2*  < > 32.0* 32.8* 32.8* 33.3* 33.1*  PLT 185  < > 193 221 261 300 321  MCV 83.8  < > 84.4 84.1 85.2 85.0 84.6  MCH 27.9  < > 28.4 27.7 27.9 28.2 27.9  MCHC 33.3  < > 33.7 33.0 32.7 33.1 33.0  RDW 15.3*  < > 15.3* 15.1* 15.4* 15.2*  15.4*  LYMPHSABS 0.6*  --   --   --  0.8*  --   --   MONOABS 1.3*  --   --   --  1.0  --   --   EOSABS 0.0  --   --   --  0.2  --   --   BASOSABS 0.0  --   --   --  0.1  --   --   < > = values in this interval not displayed.  Chemistries   Recent Labs Lab 02/20/15 1551  02/23/15 0408 02/24/15 0849 02/25/15 1131 02/26/15 0556 02/27/15 0519  NA 128*  < > 138 133* 137 138 142  K 4.3  < > 4.1 3.8 4.0 3.7 3.5  CL 96*  < > 105 103 101 104 105  CO2 24  < > 27 26 28 28 31   GLUCOSE 191*  < > 127* 141* 315* 180* 195*  BUN 39*  < > 28* 24* 29* 30* 30*  CREATININE 1.70*  < > 1.22 1.26* 1.28* 1.25* 1.16  CALCIUM 8.3*  < > 8.2* 7.8* 8.3* 8.3* 8.6*  AST 23  --   --   --   --   --   --   ALT 19  --   --   --   --   --   --   ALKPHOS 76  --   --   --   --   --   --   BILITOT 1.1  --   --   --   --   --   --   < > = values in this interval not displayed. ------------------------------------------------------------------------------------------------------------------ estimated creatinine clearance is 60.7 mL/min (by C-G formula based on Cr of 1.16). ------------------------------------------------------------------------------------------------------------------ No results for input(s): HGBA1C in the last 72 hours. ------------------------------------------------------------------------------------------------------------------ No results for input(s): CHOL, HDL, LDLCALC, TRIG, CHOLHDL, LDLDIRECT in the last 72 hours. ------------------------------------------------------------------------------------------------------------------ No results for input(s): TSH, T4TOTAL, T3FREE, THYROIDAB in the last 72 hours.  Invalid input(s): FREET3 ------------------------------------------------------------------------------------------------------------------ No results for input(s):  VITAMINB12, FOLATE, FERRITIN, TIBC, IRON, RETICCTPCT in the last 72 hours.  Coagulation profile  Recent Labs Lab  02/23/15 0408 02/24/15 0849 02/25/15 0543 02/26/15 0556 02/27/15 0519  INR 5.59* 8.20* 1.55 1.54 2.12    No results for input(s): DDIMER in the last 72 hours.  Cardiac Enzymes No results for input(s): CKMB, TROPONINI, MYOGLOBIN in the last 168 hours.  Invalid input(s): CK ------------------------------------------------------------------------------------------------------------------ Invalid input(s): POCBNP    Assessment & Plan   75 year male with dementia and atrial fibrillation on anticoagulation who initially presented for an outpatient chest x-ray for cough and subsequently fell due to weakness and now separated rib fracture.  1. Fever: CT scan of the chest shows no evidence of pneumonia, fever  treding down Continue ceftriaxone, CT of the abdomen shows severe constipation questionable also playing a role and fever  2. Rib fracture: Incentive spirometry if patient able to do 3. Dementia: Observe patient for signs of delirium. Continue Seroquel and trazodone 4. Diabetes: Continue sliding scale insulin and Lantus  As well as pre-meal insulin 5. Atrial fibrillation -INR therapeutic 6. Essential hypertension: Continue metoprolol, Norvasc and losartan 7. Severe constipation: Start patient on Colace and senna Fleet enema 1 if no improvement then mag citrate   DNR  Code Status History    Date Active Date Inactive Code Status Order ID Comments User Context   02/20/2015  8:18 PM 02/20/2015  8:19 PM DNR [id number]  Bettey Costa, MD Inpatient   02/20/2015  7:55 PM 02/20/2015  8:18 PM Full Code [id number]  Bettey Costa, MD ED   12/12/2014  9:38 AM 12/13/2014  3:23 AM Full Code [id number]  Inez Catalina, MD HOV    Questions for Most Recent Historical Code Status (Order [id number])    Question Answer Comment   In the event of cardiac or respiratory ARREST Do not call a "code blue"    In the event of cardiac or respiratory ARREST Do not perform Intubation, CPR, defibrillation or ACLS    In the  event of cardiac or respiratory ARREST Use medication by any route, position, wound care, and other measures to relive pain and suffering. May use oxygen, suction and manual treatment of airway obstruction as needed for comfort.     Advance Directive Documentation        Most Recent Value   Type of Advance Directive  Healthcare Power of Attorney   Pre-existing out of facility DNR order (yellow form or pink MOST form)     "MOST" Form in Place?             Consults  none  DVT Prophylaxis  coumadin  Lab Results  Component Value Date   PLT 321 02/27/2015     Time Spent in minutes  74min   Bradford Cazier M.D on 02/27/2015 at 11:06 AM  Between 7am to 6pm - Pager - [phone]  After 6pm go to [email or web address] - password [email or web address] [facility]   Office  [phone]

## 2015-02-27 NOTE — Progress Notes (Signed)
Physical Therapy Treatment Patient Details Name: Bobby Jacobs MRN: [id number] DOB: August 06, 1940 Today's Date: 02/27/2015    History of Present Illness Pt admitted for pneumonia with acute R rib fracture #6-10 from fall. During OP chest x-ray, pt fell of stool and obtained rib fractures. Pt with history of dementia/diabetes/HTN.    PT Comments    Pt with improved tolerance to today's session, decreased pain with activity with pt noting 4/10 while sitting at EOB.  Pt continues to demonstrate poor sitting balance requiring moderate cues and assistance to maintain upright postion.  Pt able to sit at EOB for approx 25min with assist.  He also performed sit to /from stand trf with max A x 2 and RW x 2  with posterior lean noted, however able to achieve erect posture.  He would con't to benefit from skilled PT to increase function mobility tolerance.    Follow Up Recommendations  SNF     Equipment Recommendations       Recommendations for Other Services       Precautions / Restrictions Precautions Precautions: Fall Restrictions Weight Bearing Restrictions: No    Mobility  Bed Mobility Overal bed mobility: Needs Assistance Bed Mobility: Supine to Sit;Sit to Supine     Supine to sit: +2 for physical assistance     General bed mobility comments: assistance to LE placement and trunk control   Transfers Overall transfer level: Needs assistance Equipment used: Rolling walker (2 wheeled) Transfers: Sit to/from Stand Sit to Stand: Max assist;+2 physical assistance         General transfer comment: posterior lean noted, no c/o increased pain with transfer  Ambulation/Gait                 Stairs            Wheelchair Mobility    Modified Rankin (Stroke Patients Only)       Balance Overall balance assessment: History of Falls Sitting-balance support: Feet supported Sitting balance-Leahy Scale: Poor                               Cognition Arousal/Alertness: Awake/alert Behavior During Therapy: WFL for tasks assessed/performed Overall Cognitive Status: History of cognitive impairments - at baseline                      Exercises Other Exercises Other Exercises: Pt performed LAQ, hip flexion, pillow squeezes, ankle pumps x 10 b/l in sitting with +1 for support     General Comments        Pertinent Vitals/Pain Pain Assessment: Faces Faces Pain Scale: Hurts little more Pain Location: R ribs    Home Living                      Prior Function            PT Goals (current goals can now be found in the care plan section) Acute Rehab PT Goals Patient Stated Goal: to get stronger PT Goal Formulation: With patient Time For Goal Achievement: 03/11/15 Potential to Achieve Goals: Good Progress towards PT goals: Progressing toward goals    Frequency  7X/week    PT Plan Current plan remains appropriate    Co-evaluation             End of Session Equipment Utilized During Treatment: Gait belt;Oxygen Activity Tolerance: Patient tolerated treatment well Patient left: in bed;with call  bell/phone within reach;with bed alarm set;with family/visitor present     Time: 1335-1400 PT Time Calculation (min) (ACUTE ONLY): 25 min  Charges:  $Therapeutic Exercise: 8-22 mins $Neuromuscular Re-education: 8-22 mins                    G Codes:      Bobby Jacobs 28-Feb-2015, 2:49 PM  Bobby Jacobs, PTA

## 2015-02-27 NOTE — Progress Notes (Signed)
Per MD, patient is not medically stable to discharge to SNF today.  CSW spoke to patient's wife who was at bedside.  States she understands that patient will not discharge today.  Call to [facility] [place] to provide an update.  Per Arville Go, patient will not be able to admit to [facility] over the weekend they will not be able to take patient until Monday.  Casimer Lanius. [place] Work Department [phone] 1:29 PM

## 2015-02-27 NOTE — Progress Notes (Signed)
Initial Nutrition Assessment   INTERVENTION:   Meals and Snacks: Cater to patient preferences Medical Food Supplement Therapy: will recommend Sugar Free Mighty Shakes on meal trays TID and magic Cup TID for added nutrition (each supplement provides approximately 300kcals and 9g protein)   NUTRITION DIAGNOSIS:   Swallowing difficulty related to dysphagia as evidenced by  (current diet order, SLP following).  GOAL:   Patient will meet greater than or equal to 90% of their needs  MONITOR:    (Energy Intake, digestive System, Electrolyte and renal Profile, Digestive System)  REASON FOR ASSESSMENT:   Consult Poor PO  ASSESSMENT:   Pt admitted with pna.   Past Medical History  Diagnosis Date  . Hypertension   . Coronary artery disease   . Depression   . GERD (gastroesophageal reflux disease)   . Cardiovascular disease   . High cholesterol   . Pneumonia   . Fracture   . Obesity   . Sleep apnea   . CHF (congestive heart failure) ([place])   . Dementia   . Atrial fibrillation ([place])   . Urinary retention   . Urine frequency   . Incomplete bladder emptying   . Stroke ([place])   . Peripheral vascular disease ([place])   . Aortic aneurysm ([place])   . Aortic valve replaced   . Hyperthyroidism   . Diabetes mellitus, type 2 ([place])   . Skin cancer     Diet Order:  DIET DYS 2 Room service appropriate?: Yes with Assist; Fluid consistency:: Nectar Thick    Current Nutrition: Pt eating on visit, had eaten 100% of fruit, cottage cheese, Magic Cup, Mighty shake, and nectar milk. Pt had left bites of bread as he is not fond of taste. Recorded po intake 77% of meals on average since admission.  Food/Nutrition-Related History: Pt wife reports pt has a good appetite usually but was very thankful for supplement options on Nectar thick consistency.   Scheduled Medications:  . amLODipine  10 mg Oral Daily  . atorvastatin  80 mg Oral QHS  . calcium-vitamin D  1 tablet Oral BID  . carbamide  peroxide  2 drop Both Ears Once per day on Mon Thu  . cefTRIAXone (ROCEPHIN)  IV  1 g Intravenous Q24H  . docusate sodium  200 mg Oral BID  . furosemide  20 mg Intravenous Q12H  . insulin aspart  0-5 Units Subcutaneous QHS  . insulin aspart  0-9 Units Subcutaneous TID WC  . insulin aspart  4 Units Subcutaneous TID WC  . insulin glargine  50 Units Subcutaneous Daily  . ipratropium-albuterol  3 mL Nebulization TID  . losartan  100 mg Oral Daily  . metoprolol  75 mg Oral BID  . multivitamin-lutein  1 capsule Oral BID  . nystatin   Topical BID  . polyethylene glycol  17 g Oral BID  . psyllium  1 packet Oral TID  . QUEtiapine  50 mg Oral QHS  . sennosides  10 mL Oral BID  . sodium phosphate  1 enema Rectal Once  . traZODone  150 mg Oral QHS  . vitamin B-12  1,000 mcg Oral Daily  . warfarin  6.5 mg Oral ONCE-1800  . Warfarin - Pharmacist Dosing Inpatient   Does not apply q1800     Electrolyte/Renal Profile and Glucose Profile:   Recent Labs Lab 02/25/15 1131 02/26/15 0556 02/27/15 0519  NA 137 138 142  K 4.0 3.7 3.5  CL 101 104 105  CO2 28 28  31  BUN 29* 30* 30*  CREATININE 1.28* 1.25* 1.16  CALCIUM 8.3* 8.3* 8.6*  GLUCOSE 315* 180* 195*   Protein Profile:  Recent Labs Lab 02/20/15 1551  ALBUMIN 3.2*    Gastrointestinal Profile: Last BM:  02/26/2015   Weight Change: Pt weight stable per [facility] weight encounters.   Height:   Ht Readings from Last 1 Encounters:  02/20/15 5\' 10"  (1.778 m)    Weight:   Wt Readings from Last 1 Encounters:  02/20/15 195 lb (88.451 kg)    Wt Readings from Last 10 Encounters:  02/20/15 195 lb (88.451 kg)  02/12/15 190 lb (86.183 kg)  02/10/15 190 lb (86.183 kg)  01/30/15 195 lb (88.451 kg)  01/14/15 195 lb (88.451 kg)  11/05/14 199 lb (90.266 kg)  10/27/14 197 lb 8 oz (89.585 kg)  09/22/14 200 lb (90.719 kg)  09/08/14 197 lb 1.6 oz (89.404 kg)  07/18/14 196 lb 8 oz (89.132 kg)     BMI:  Body mass index is 27.98  kg/(m^2).   EDUCATION NEEDS:   No education needs identified at this time   [place], RD, LDN Pager [phone] Weekend/On-Call Pager [phone]

## 2015-02-27 NOTE — Progress Notes (Signed)
[place] INFECTIOUS DISEASE PROGRESS NOTE Date of Admission:  02/20/2015     ID: Bobby Jacobs is a 75 y.o. male with  fevers, uti, chronic sp cath Active Problems:   Pneumonia  Subjective: No fevers, mild cough. Ct showed only constipation. Per wife is moving bowels now  ROS  Eleven systems are reviewed and negative except per hpi  Medications:  Antibiotics Given (last 72 hours)    Date/Time Action Medication Dose Rate   02/24/15 2058 Given   cefTRIAXone (ROCEPHIN) 1 g in dextrose 5 % 50 mL IVPB 1 g 100 mL/hr   02/25/15 1245 Given   cefTRIAXone (ROCEPHIN) 1 g in dextrose 5 % 50 mL IVPB 1 g 100 mL/hr   02/26/15 1240 Given   cefTRIAXone (ROCEPHIN) 1 g in dextrose 5 % 50 mL IVPB 1 g 100 mL/hr   02/27/15 1410 Given   cefTRIAXone (ROCEPHIN) 1 g in dextrose 5 % 50 mL IVPB 1 g 100 mL/hr     . amLODipine  10 mg Oral Daily  . atorvastatin  80 mg Oral QHS  . calcium-vitamin D  1 tablet Oral BID  . carbamide peroxide  2 drop Both Ears Once per day on Mon Thu  . cefTRIAXone (ROCEPHIN)  IV  1 g Intravenous Q24H  . docusate sodium  200 mg Oral BID  . furosemide  20 mg Intravenous Q12H  . insulin aspart  0-5 Units Subcutaneous QHS  . insulin aspart  0-9 Units Subcutaneous TID WC  . insulin aspart  4 Units Subcutaneous TID WC  . insulin glargine  50 Units Subcutaneous Daily  . ipratropium-albuterol  3 mL Nebulization TID  . losartan  100 mg Oral Daily  . metoprolol  75 mg Oral BID  . multivitamin-lutein  1 capsule Oral BID  . nystatin   Topical BID  . polyethylene glycol  17 g Oral BID  . psyllium  1 packet Oral TID  . QUEtiapine  50 mg Oral QHS  . sennosides  10 mL Oral BID  . sodium phosphate  1 enema Rectal Once  . traZODone  150 mg Oral QHS  . vitamin B-12  1,000 mcg Oral Daily  . warfarin  6.5 mg Oral ONCE-1800  . Warfarin - Pharmacist Dosing Inpatient   Does not apply q1800    Objective: Vital signs in last 24 hours: Temp:  [97.5 F (36.4 C)-98.7 F  (37.1 C)] 98.1 F (36.7 C) (02/03 1109) Pulse Rate:  [89-98] 97 (02/03 0721) Resp:  [18-20] 20 (02/03 0721) BP: (151-157)/(77-95) 151/88 mmHg (02/03 0721) SpO2:  [95 %-100 %] 97 % (02/03 1441) Constitutional: frail, chronically ill appearing HENT: anicteric Mouth/Throat: Oropharynx is clear and dry. No oropharyngeal exudate.  Cardiovascular: Normal rate, regular rhythm and normal heart sounds. Pulmonary/Chest: Effort normal and breath sounds normal. No respiratory distress. He has no wheezes.  Abdominal: Soft. Bowel sounds are normal. He exhibits no distension. There is no tenderness.  SP cath site wnl Lymphadenopathy: He has no cervical adenopathy.  Neurological: alert, flat facies, + cog wheeling Skin: Skin is warm and dry. No rash noted. No erythema.  Psychiatric: He has a normal mood and affect. His behavior is normal.   Lab Results  Recent Labs  02/26/15 0556 02/27/15 0519  WBC 14.1* 11.2*  HGB 11.0* 10.9*  HCT 33.3* 33.1*  NA 138 142  K 3.7 3.5  CL 104 105  CO2 28 31  BUN 30* 30*  CREATININE 1.25* 1.16    Microbiology:  Results for orders placed or performed during the hospital encounter of 02/20/15  Culture, blood (routine x 2)     Status: None   Collection Time: 02/20/15  2:39 PM  Result Value Ref Range Status   Specimen Description BLOOD RIGHT HAND  Final   Special Requests BOTTLES DRAWN AEROBIC AND ANAEROBIC  1CC  Final   Culture NO GROWTH 6 DAYS  Final   Report Status 02/26/2015 FINAL  Final  Culture, blood (routine x 2)     Status: None   Collection Time: 02/20/15  2:39 PM  Result Value Ref Range Status   Specimen Description BLOOD LEFT ANTECUBITAL  Final   Special Requests BOTTLES DRAWN AEROBIC AND ANAEROBIC  1CC  Final   Culture NO GROWTH 6 DAYS  Final   Report Status 02/26/2015 FINAL  Final  Urine culture     Status: None   Collection Time: 02/20/15  4:00 PM  Result Value Ref Range Status   Specimen Description URINE, RANDOM  Final   Special  Requests NONE  Final   Culture >=100,000 COLONIES/mL PROTEUS MIRABILIS  Final   Report Status 02/22/2015 FINAL  Final   Organism ID, Bacteria PROTEUS MIRABILIS  Final      Susceptibility   Proteus mirabilis - MIC*    AMPICILLIN >=32 RESISTANT Resistant     CEFAZOLIN 8 SENSITIVE Sensitive     CEFTRIAXONE <=1 SENSITIVE Sensitive     CIPROFLOXACIN >=4 RESISTANT Resistant     GENTAMICIN >=16 RESISTANT Resistant     IMIPENEM 2 SENSITIVE Sensitive     NITROFURANTOIN 128 RESISTANT Resistant     TRIMETH/SULFA >=320 RESISTANT Resistant     AMPICILLIN/SULBACTAM >=32 RESISTANT Resistant     PIP/TAZO <=4 SENSITIVE Sensitive     * >=100,000 COLONIES/mL PROTEUS MIRABILIS  Rapid Influenza A&B Antigens ([facility] only)     Status: None   Collection Time: 02/20/15  5:20 PM  Result Value Ref Range Status   Influenza A ([place]) NEGATIVE  Final   Influenza B ([facility]) NEGATIVE  Final  MRSA PCR Screening     Status: None   Collection Time: 02/20/15  8:55 PM  Result Value Ref Range Status   MRSA by PCR NEGATIVE NEGATIVE Final    Comment:        The GeneXpert MRSA Assay (FDA approved for NASAL specimens only), is one component of a comprehensive MRSA colonization surveillance program. It is not intended to diagnose MRSA infection nor to guide or monitor treatment for MRSA infections.   CULTURE, BLOOD (ROUTINE X 2) w Reflex to PCR ID Panel     Status: None (Preliminary result)   Collection Time: 02/25/15  2:29 PM  Result Value Ref Range Status   Specimen Description BLOOD RIGHT ASSIST CONTROL  Final   Special Requests   Final    BOTTLES DRAWN AEROBIC AND ANAEROBIC AERO 3CC ANA 5CC   Culture NO GROWTH 2 DAYS  Final   Report Status PENDING  Incomplete  CULTURE, BLOOD (ROUTINE X 2) w Reflex to PCR ID Panel     Status: None (Preliminary result)   Collection Time: 02/25/15  2:29 PM  Result Value Ref Range Status   Specimen Description BLOOD LEFT ASSIST CONTROL  Final   Special Requests BOTTLES DRAWN  AEROBIC AND ANAEROBIC 3CC  Final   Culture NO GROWTH 2 DAYS  Final   Report Status PENDING  Incomplete    Studies/Results: Ct Abdomen Pelvis W Contrast  02/26/2015  CLINICAL DATA:  Generalized abdomen  pain EXAM: CT ABDOMEN AND PELVIS WITH CONTRAST TECHNIQUE: Multidetector CT imaging of the abdomen and pelvis was performed using the standard protocol following bolus administration of intravenous contrast. CONTRAST:  132mL OMNIPAQUE IOHEXOL 300 MG/ML  SOLN COMPARISON:  August 04, 2014 FINDINGS: The liver, spleen, pancreas, adrenal glands are normal. The gallbladder is normal. There is a focus of densely opacified colonic diverticulum near the gallbladder unchanged compared to prior exam. There is a 2.5 cm cyst in midpole right kidney. There is no hydronephrosis bilaterally. Mild chronic perinephric stranding are noted. There is infrarenal abdominal aortic aneurysm measuring 4.6 cm unchanged compared to prior exam. The aorta is densely calcified. There is no abdominal lymphadenopathy. There is no small bowel obstruction or diverticulitis. Extensive bowel content is identified throughout colon. The appendix is not seen but no inflammatory changes noted around cecum to suggest appendicitis. A suprapubic catheter is identified in a decompressed bladder limiting evaluation. There are small bilateral pleural effusions with mild dependent atelectasis of the posterior lung bases. Degenerative joint changes of the spine are noted. IMPRESSION: No acute abnormality identified in the abdomen and pelvis. Extensive bowel content identified throughout colon suggesting constipation. Infrarenal abdominal aortic aneurysm unchanged compared to prior exam. Small bilateral pleural effusions with dependent atelectasis of posterior lung bases. Electronically Signed   By: Abelardo Diesel M.D.   On: 02/26/2015 16:33    Assessment/Plan: Bobby Jacobs is a 75 y.o. male with Dementia, DM admitted with cough, and wheezing. He had  CXR done showing LLL pna (had rib fx from fall while getting xray). He has a chronic suprapubic cath and has had recurrent UTIs. UA with TNTC wbc on admit and ucx proteus. BCX negative 1/27 and 2/1. Flu negative.  Had CT neg for infiltrate On admit had fevers to 101.9, wbc 14. Started on levo and zosyn. Vanco added 1/28. Changed to ctx 1/30. He has recurrent fevers despite abx. He also has recurrent confusion and tachycardia without etiology. May be having aspiration events or could have pyelonephritis as cause of his recurrent fevers  Recommendations Continue ceftraixone for until ready for dc Then change to oral keflex 500 mg bid for a total 14 day abx course from 1/29 Has seen Speech and swallow - I think he is an aspiration risk  Thank you very much for the consult. Will follow with you.  [place], [place]   02/27/2015, 2:48 PM

## 2015-02-27 NOTE — Progress Notes (Signed)
Inpatient Diabetes Program Recommendations  AACE/ADA: New Consensus Statement on Inpatient Glycemic Control (2015)  Target Ranges:  Prepandial:   less than 140 mg/dL      Peak postprandial:   less than 180 mg/dL (1-2 hours)      Critically ill patients:  140 - 180 mg/dL  Results for COURTLIN, COCKER (MRN [id number]) as of 02/27/2015 14:13  Ref. Range 02/26/2015 08:53 02/26/2015 11:16 02/26/2015 21:55 02/27/2015 07:56 02/27/2015 11:18  Glucose-Capillary Latest Ref Range: 65-99 mg/dL 214 (H) 283 (H) 335 (H) 147 (H) 266 (H)   Review of Glycemic Control  Diabetes history: DM2 Outpatient Diabetes medications: Toujeo 43 units daily, Humalog 5 units TID with meals Current orders for Inpatient glycemic control: Lantus 50 units daily,Novolog 4 units TID with meals for meal coverage, Novolog 0-9 units TID with meals, Novolog 0-5 units HS  Inpatient Diabetes Program Recommendations: Insulin - Basal: Lantus was increased to 50 units daily and fasting glucose was 147 mg/dl this morning. Would not recommend any changes with basal insulin at this time. Insulin - Meal Coverage: If post prandial glucose continues to be elevated, please consider increasing meal coverage to Novolog 8 units TID with meals.  Thanks, Barnie Alderman, RN, MSN, CDE Diabetes Coordinator Inpatient Diabetes Program [phone] (Team Pager from [place] to [place]) [phone] (AP office) [phone] [facility] office) [phone] [facility] office)

## 2015-02-28 LAB — CBC
HCT: 32.8 % — ABNORMAL LOW (ref 40.0–52.0)
HEMOGLOBIN: 10.8 g/dL — AB (ref 13.0–18.0)
MCH: 28 pg (ref 26.0–34.0)
MCHC: 33.1 g/dL (ref 32.0–36.0)
MCV: 84.5 fL (ref 80.0–100.0)
Platelets: 366 10*3/uL (ref 150–440)
RBC: 3.88 MIL/uL — AB (ref 4.40–5.90)
RDW: 15.2 % — ABNORMAL HIGH (ref 11.5–14.5)
WBC: 12.8 10*3/uL — ABNORMAL HIGH (ref 3.8–10.6)

## 2015-02-28 LAB — BASIC METABOLIC PANEL
ANION GAP: 9 (ref 5–15)
BUN: 34 mg/dL — AB (ref 6–20)
CHLORIDE: 102 mmol/L (ref 101–111)
CO2: 30 mmol/L (ref 22–32)
Calcium: 8.7 mg/dL — ABNORMAL LOW (ref 8.9–10.3)
Creatinine, Ser: 1.21 mg/dL (ref 0.61–1.24)
GFR calc non Af Amer: 56 mL/min — ABNORMAL LOW (ref >60)
Glucose, Bld: 224 mg/dL — ABNORMAL HIGH (ref 65–99)
POTASSIUM: 4 mmol/L (ref 3.5–5.1)
SODIUM: 141 mmol/L (ref 135–145)

## 2015-02-28 LAB — PROTIME-INR
INR: 3.3
PROTHROMBIN TIME: 32.9 s — AB (ref 11.4–15.0)

## 2015-02-28 LAB — GLUCOSE, CAPILLARY
GLUCOSE-CAPILLARY: 195 mg/dL — AB (ref 65–99)
GLUCOSE-CAPILLARY: 235 mg/dL — AB (ref 65–99)
GLUCOSE-CAPILLARY: 309 mg/dL — AB (ref 65–99)
Glucose-Capillary: 360 mg/dL — ABNORMAL HIGH (ref 65–99)

## 2015-02-28 MED ORDER — INSULIN GLARGINE 100 UNIT/ML ~~LOC~~ SOLN
53.0000 [IU] | Freq: Every day | SUBCUTANEOUS | Status: DC
  Administered 2015-03-01 – 2015-03-02 (×2): 53 [IU] via SUBCUTANEOUS
  Filled 2015-02-28 (×2): qty 0.53

## 2015-02-28 NOTE — Consult Note (Signed)
ANTICOAGULATION CONSULT NOTE - Follow Up  Pharmacy Consult for warfarin Indication: atrial fibrillation  No Known Allergies  Patient Measurements: Height: 5\' 10"  (177.8 cm) Weight: 195 lb (88.451 kg) IBW/kg (Calculated) : 73  Vital Signs: Temp: 98.7 F (37.1 C) (02/04 0853) Temp Source: Axillary (02/04 0853) BP: 149/96 mmHg (02/04 0853) Pulse Rate: 120 (02/04 0853)  Labs:  Recent Labs  02/26/15 0556 02/27/15 0519 02/28/15 0339  HGB 11.0* 10.9* 10.8*  HCT 33.3* 33.1* 32.8*  PLT 300 321 366  LABPROT 18.5* 23.6* 32.9*  INR 1.54 2.12 3.30  CREATININE 1.25* 1.16 1.21    Estimated Creatinine Clearance: 58.2 mL/min (by C-G formula based on Cr of 1.21).   Assessment: Bobby Jacobs is a 75yo male admitted for weakness and cough. Pt on warfarin as outpatient for Afib. OP regimen is warfarin 7.5mg  PO QHS. Patient's INR on admit was 3.  1/27 admit INR 3 - warfarin 7.5 mg given No INR 1/28-1/29 - warfarin 7.5 mg given each of these days 1/30 INR 5.59. Supratherapeutic. Hold dose and recheck INR with AM labs. 1/31 INR 8.2. Supratherapeutic. Patient received 10mg  IV Vitamin K x1 at 1130 this morning. Follow up and recheck INR with AM labs. 2/1 INR 1.55. Subtherapeutic - warfarin 7.5 mg given 2/2 INR 1.54; 7.5 2/3 INR 2.12; 6.5   Goal of Therapy:  INR 2-3 Monitor platelets by anticoagulation protocol: Yes   Plan:  INR jumped significantly over the past 48 hours. Will hold warfarin for today and will order PT/INR with tomorrow's labs.   Thank you for including pharmacy in this patient's care. Pharmacy will continue to monitor.  Larene Beach, PharmD   02/28/2015

## 2015-02-28 NOTE — Progress Notes (Signed)
[place] at [facility]                                                                                                                                                                                            Patient Demographics   Bobby Jacobs, is a 75 y.o. male, DOB - [birth year], [id number]  Admit date - 02/20/2015   Admitting Physician Hillary Bow, MD  Outpatient Primary MD for the patient is [name]., PA   LOS - 8  Subjective:   No further fevers. Has baseline dementia. Unable to provide any other history.  Review of Systems:   CONSTITUTIONAL: Unable to provide   Vitals:   Filed Vitals:   02/28/15 0453 02/28/15 0521 02/28/15 0836 02/28/15 0853  BP: 171/73 156/70  149/96  Pulse: 92 86  120  Temp: 98.8 F (37.1 C)   98.7 F (37.1 C)  TempSrc: Oral   Axillary  Resp: 18   17  Height:      Weight:      SpO2: 97%  98% 97%    Wt Readings from Last 3 Encounters:  02/20/15 88.451 kg (195 lb)  02/12/15 86.183 kg (190 lb)  02/10/15 86.183 kg (190 lb)     Intake/Output Summary (Last 24 hours) at 02/28/15 1039 Last data filed at 02/28/15 0800  Gross per 24 hour  Intake    710 ml  Output   2051 ml  Net  -1341 ml    Physical Exam:   GENERAL: Chronically ill-appearing HEAD, EYES, EARS, NOSE AND THROAT: Atraumatic, normocephalic. . Pupils equal and reactive to light. Sclerae anicteric. No conjunctival injection. No oro-pharyngeal erythema.  NECK: Supple. There is no jugular venous distention. No bruits, no lymphadenopathy, no thyromegaly.  HEART: Regular rate and rhythm,. No murmurs, no rubs, no clicks.  LUNGS: Diminished breath rales rhonchi wheezing ABDOMEN: Soft, flat, nontender, nondistended. Has good bowel sounds. No hepatosplenomegaly appreciated.  EXTREMITIES: No evidence of any cyanosis, clubbing, or peripheral edema.  +2 pedal and radial pulses bilaterally.  NEUROLOGIC: Limited exam due to patient unable to  follow commands SKIN: Moist and warm with no rashes appreciated.  Psych: Anxious to depressed LN: No inguinal LN enlargement    Antibiotics   Anti-infectives    Start     Dose/Rate Route Frequency Ordered Stop   02/23/15 1400  clindamycin (CLEOCIN) IVPB 300 mg  Status:  Discontinued     300 mg 100 mL/hr over 30 Minutes Intravenous 3 times per day 02/23/15 1233 02/24/15 1407   02/23/15 1300  cefTRIAXone (ROCEPHIN) 1 g in dextrose 5 % 50  mL IVPB     1 g 100 mL/hr over 30 Minutes Intravenous Every 24 hours 02/23/15 1233     02/21/15 2130  vancomycin (VANCOCIN) 1,250 mg in sodium chloride 0.9 % 250 mL IVPB  Status:  Discontinued     1,250 mg 166.7 mL/hr over 90 Minutes Intravenous Every 18 hours 02/21/15 1224 02/21/15 1239   02/21/15 1930  vancomycin (VANCOCIN) IVPB 750 mg/150 ml premix  Status:  Discontinued     750 mg 150 mL/hr over 60 Minutes Intravenous Every 12 hours 02/21/15 1239 02/23/15 1232   02/21/15 1300  meropenem (MERREM) 1 g in sodium chloride 0.9 % 100 mL IVPB  Status:  Discontinued     1 g 200 mL/hr over 30 Minutes Intravenous Every 12 hours 02/21/15 1208 02/23/15 1232   02/21/15 1245  vancomycin (VANCOCIN) IVPB 1000 mg/200 mL premix     1,000 mg 200 mL/hr over 60 Minutes Intravenous  Once 02/21/15 1239 02/21/15 1443   02/21/15 1230  vancomycin (VANCOCIN) 1,500 mg in sodium chloride 0.9 % 500 mL IVPB  Status:  Discontinued     1,500 mg 250 mL/hr over 120 Minutes Intravenous  Once 02/21/15 1224 02/21/15 1239   02/20/15 2200  piperacillin-tazobactam (ZOSYN) IVPB 3.375 g  Status:  Discontinued     3.375 g 12.5 mL/hr over 240 Minutes Intravenous 3 times per day 02/20/15 2024 02/21/15 1208   02/20/15 1515  levofloxacin (LEVAQUIN) IVPB 500 mg     500 mg 100 mL/hr over 60 Minutes Intravenous  Once 02/20/15 1511 02/20/15 1610      Medications   Scheduled Meds: . amLODipine  10 mg Oral Daily  . atorvastatin  80 mg Oral QHS  . calcium-vitamin D  1 tablet Oral BID  .  carbamide peroxide  2 drop Both Ears Once per day on Mon Thu  . cefTRIAXone (ROCEPHIN)  IV  1 g Intravenous Q24H  . docusate sodium  200 mg Oral BID  . furosemide  20 mg Intravenous Q12H  . insulin aspart  0-5 Units Subcutaneous QHS  . insulin aspart  0-9 Units Subcutaneous TID WC  . insulin aspart  4 Units Subcutaneous TID WC  . insulin glargine  50 Units Subcutaneous Daily  . ipratropium-albuterol  3 mL Nebulization TID  . losartan  100 mg Oral Daily  . metoprolol  75 mg Oral BID  . multivitamin-lutein  1 capsule Oral BID  . nystatin   Topical BID  . polyethylene glycol  17 g Oral BID  . psyllium  1 packet Oral TID  . QUEtiapine  50 mg Oral QHS  . sennosides  10 mL Oral BID  . sodium phosphate  1 enema Rectal Once  . traZODone  150 mg Oral QHS  . vitamin B-12  1,000 mcg Oral Daily  . Warfarin - Pharmacist Dosing Inpatient   Does not apply q1800   Continuous Infusions:   PRN Meds:.acetaminophen **OR** acetaminophen, alum & mag hydroxide-simeth, bisacodyl, ibuprofen, ondansetron **OR** ondansetron (ZOFRAN) IV, senna-docusate   Data Review:   Micro Results Recent Results (from the past 240 hour(s))  Culture, blood (routine x 2)     Status: None   Collection Time: 02/20/15  2:39 PM  Result Value Ref Range Status   Specimen Description BLOOD RIGHT HAND  Final   Special Requests BOTTLES DRAWN AEROBIC AND ANAEROBIC  1CC  Final   Culture NO GROWTH 6 DAYS  Final   Report Status 02/26/2015 FINAL  Final  Culture, blood (routine  x 2)     Status: None   Collection Time: 02/20/15  2:39 PM  Result Value Ref Range Status   Specimen Description BLOOD LEFT ANTECUBITAL  Final   Special Requests BOTTLES DRAWN AEROBIC AND ANAEROBIC  1CC  Final   Culture NO GROWTH 6 DAYS  Final   Report Status 02/26/2015 FINAL  Final  Urine culture     Status: None   Collection Time: 02/20/15  4:00 PM  Result Value Ref Range Status   Specimen Description URINE, RANDOM  Final   Special Requests NONE   Final   Culture >=100,000 COLONIES/mL PROTEUS MIRABILIS  Final   Report Status 02/22/2015 FINAL  Final   Organism ID, Bacteria PROTEUS MIRABILIS  Final      Susceptibility   Proteus mirabilis - MIC*    AMPICILLIN >=32 RESISTANT Resistant     CEFAZOLIN 8 SENSITIVE Sensitive     CEFTRIAXONE <=1 SENSITIVE Sensitive     CIPROFLOXACIN >=4 RESISTANT Resistant     GENTAMICIN >=16 RESISTANT Resistant     IMIPENEM 2 SENSITIVE Sensitive     NITROFURANTOIN 128 RESISTANT Resistant     TRIMETH/SULFA >=320 RESISTANT Resistant     AMPICILLIN/SULBACTAM >=32 RESISTANT Resistant     PIP/TAZO <=4 SENSITIVE Sensitive     * >=100,000 COLONIES/mL PROTEUS MIRABILIS  Rapid Influenza A&B Antigens ([facility] only)     Status: None   Collection Time: 02/20/15  5:20 PM  Result Value Ref Range Status   Influenza A ([place]) NEGATIVE  Final   Influenza B ([facility]) NEGATIVE  Final  MRSA PCR Screening     Status: None   Collection Time: 02/20/15  8:55 PM  Result Value Ref Range Status   MRSA by PCR NEGATIVE NEGATIVE Final    Comment:        The GeneXpert MRSA Assay (FDA approved for NASAL specimens only), is one component of a comprehensive MRSA colonization surveillance program. It is not intended to diagnose MRSA infection nor to guide or monitor treatment for MRSA infections.   CULTURE, BLOOD (ROUTINE X 2) w Reflex to PCR ID Panel     Status: None (Preliminary result)   Collection Time: 02/25/15  2:29 PM  Result Value Ref Range Status   Specimen Description BLOOD RIGHT ASSIST CONTROL  Final   Special Requests   Final    BOTTLES DRAWN AEROBIC AND ANAEROBIC AERO 3CC ANA 5CC   Culture NO GROWTH 3 DAYS  Final   Report Status PENDING  Incomplete  CULTURE, BLOOD (ROUTINE X 2) w Reflex to PCR ID Panel     Status: None (Preliminary result)   Collection Time: 02/25/15  2:29 PM  Result Value Ref Range Status   Specimen Description BLOOD LEFT ASSIST CONTROL  Final   Special Requests BOTTLES DRAWN AEROBIC AND ANAEROBIC  3CC  Final   Culture NO GROWTH 3 DAYS  Final   Report Status PENDING  Incomplete    Radiology Reports Dg Chest 1 View  02/24/2015  CLINICAL DATA:  Short of breath EXAM: CHEST 1 VIEW COMPARISON:  02/20/2015 FINDINGS: Cardiac enlargement. Progression of mild interstitial edema. No pleural effusion. Mild bibasilar atelectasis. Right rib fractures noted. IMPRESSION: Progression of mild bilateral airspace disease consistent with interstitial edema. Electronically Signed   By: Franchot Gallo M.D.   On: 02/24/2015 07:15   Dg Chest 1 View  02/20/2015  CLINICAL DATA:  Mildly productive cough and wheezing for the past 2 days ; EXAM: CHEST 1 VIEW PA with one  view lateral chest x-ray. The images are duplicated in PACs. The PA was performed at the [facility] outpatient facility but the patient then faint it and was transported to a [facility] where the lateral was performed. COMPARISON:  PA and lateral chest x-ray of November 05, 2014 FINDINGS: The lungs are adequately inflated. There is mildly increased density in the left lower lobe posteriorly. There is no pleural effusion. The heart is top-normal in size. The pulmonary vascularity is normal. The 7 sternal wires are intact. 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 There is calcification in the wall of the thoracic aorta. The bony thorax exhibits no acute abnormality. IMPRESSION: Subsegmental atelectasis or early pneumonia in the left lower lobe. There is no evidence of pulmonary edema. Followup PA and lateral chest X-ray is recommended in 3-4 weeks following trial of antibiotic therapy to ensure resolution and exclude underlying malignancy. Electronically Signed   By: David  Martinique M.D.   On: 02/20/2015 12:05   Dg Chest 1 View  02/20/2015  CLINICAL DATA:  Mildly productive cough and wheezing for the past 2 days ; EXAM: CHEST 1 VIEW PA with one view lateral chest x-ray. The images are duplicated in PACs. The PA was performed at the [facility] outpatient facility but  the patient then faint it and was transported to a [facility] where the lateral was performed. COMPARISON:  PA and lateral chest x-ray of November 05, 2014 FINDINGS: The lungs are adequately inflated. There is mildly increased density in the left lower lobe posteriorly. There is no pleural effusion. The heart is top-normal in size. The pulmonary vascularity is normal. The 7 sternal wires are intact. 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 There is calcification in the wall of the thoracic aorta. The bony thorax exhibits no acute abnormality. IMPRESSION: Subsegmental atelectasis or early pneumonia in the left lower lobe. There is no evidence of pulmonary edema. Followup PA and lateral chest X-ray is recommended in 3-4 weeks following trial of antibiotic therapy to ensure resolution and exclude underlying malignancy. Electronically Signed   By: David  Martinique M.D.   On: 02/20/2015 12:05   Dg Ribs Unilateral Right  02/20/2015  CLINICAL DATA:  Right-sided rib pain following fall, initial encounter EXAM: RIGHT RIBS - 2 VIEW COMPARISON:  None. FINDINGS: Mildly displaced fractures of the eighth, ninth and tenth ribs are noted on the right posteriorly. No other fractures are seen. No underlying pneumothorax is noted. IMPRESSION: Multiple rib fractures posteriorly on the right consistent with the recent injury. No pneumothorax noted. Electronically Signed   By: Inez Catalina M.D.   On: 02/20/2015 12:32   Ct Head Wo Contrast  02/24/2015  CLINICAL DATA:  Change in pt status, decreased responses, delayed responses. Pinpoint pupils with sluggish response to light. Irregular pulse wit hx. of a-fib. Cool, clammy skin. BG 164. VS WNL. Pt has been on/off CPaP throughout the day due to drowsiness. EXAM: CT HEAD WITHOUT CONTRAST TECHNIQUE: Contiguous axial images were obtained from the base of the skull through the vertex without intravenous contrast. COMPARISON:  02/20/2015 FINDINGS: The ventricles are enlarged, to a greater degree  than the sulci, but stable when compared to the prior exam. Overall atrophy is advanced for age. Encephalomalacia seen along the posterior medial right parietal and adjacent occipital lobe reflecting an old infarct, stable. There is no evidence of a recent cortical infarct. There are no parenchymal masses or mass effect. Periventricular white matter hypoattenuation is noted consistent with mild chronic microvascular ischemic change. There are no  extra-axial masses or abnormal fluid collections. There is no intracranial hemorrhage. Visualized sinuses and mastoid air cells are clear. IMPRESSION: 1. No acute intracranial abnormalities. 2. Advanced atrophy.  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 Electronically Signed   By: Lajean Manes M.D.   On: 02/24/2015 17:43   Ct Head Wo Contrast  02/20/2015  CLINICAL DATA:  Fall today, on Coumadin.  History of stroke. EXAM: CT HEAD WITHOUT CONTRAST TECHNIQUE: Contiguous axial images were obtained from the base of the skull through the vertex without intravenous contrast. COMPARISON:  08/03/2013 FINDINGS: There is atrophy and chronic small vessel disease changes. Old right occipital infarct. No acute intracranial abnormality. Specifically, no hemorrhage, hydrocephalus, mass lesion, acute infarction, or significant intracranial injury. No acute calvarial abnormality. Visualized paranasal sinuses and mastoids clear. Orbital soft tissues unremarkable. IMPRESSION: Old right occipital infarct. No acute intracranial abnormality. Atrophy, chronic microvascular disease. Electronically Signed   By: Rolm Baptise M.D.   On: 02/20/2015 16:46   Ct Chest W Contrast  02/24/2015  CLINICAL DATA:  Follow-up chest x-ray.  Short breath EXAM: CT CHEST WITH CONTRAST TECHNIQUE: Multidetector CT imaging of the chest was performed during intravenous contrast administration. CONTRAST:  95mL OMNIPAQUE IOHEXOL 300 MG/ML  SOLN COMPARISON:  Chest CT 08/10/2011 FINDINGS: Mediastinum/Nodes: No axillary  supraclavicular lymphadenopathy. No axillary adenopathy. The thyroid gland is enlarged coarse calcifications. Mild mediastinal hilar adenopathy is present. 12 mm RIGHT lower paratracheal lymph node for example. 10 mm RIGHT hilar lymph node on image 26 series Lungs/Pleura: Small bilateral pleural effusions with passive atelectasis in the lower lobes. No discrete nodularity. No airspace disease. No pneumothorax. Upper abdomen: Limited view of the liver, kidneys, pancreas are unremarkable. Normal adrenal glands. Musculoskeletal: No aggressive osseous lesion. There is fractures of the posterior lateral sixth through tenth ribs. Several the lower lower ribs fractures are mildly displaced (image 44, series). No pneumothorax with IMPRESSION: 1. Multiple Posterior RIGHT rib fractures (sixth through tenth ribs). No pneumothorax 2. Mild mediastinal adenopathy likely reactive. 3. Small bilateral pleural effusions and passive atelectasis. 4. Nodular from thyroid gland likely represents a benign goiter Electronically Signed   By: Suzy Bouchard M.D.   On: 02/24/2015 09:48   Ct Abdomen Pelvis W Contrast  02/26/2015  CLINICAL DATA:  Generalized abdomen pain EXAM: CT ABDOMEN AND PELVIS WITH CONTRAST TECHNIQUE: Multidetector CT imaging of the abdomen and pelvis was performed using the standard protocol following bolus administration of intravenous contrast. CONTRAST:  137mL OMNIPAQUE IOHEXOL 300 MG/ML  SOLN COMPARISON:  August 04, 2014 FINDINGS: The liver, spleen, pancreas, adrenal glands are normal. The gallbladder is normal. There is a focus of densely opacified colonic diverticulum near the gallbladder unchanged compared to prior exam. There is a 2.5 cm cyst in midpole right kidney. There is no hydronephrosis bilaterally. Mild chronic perinephric stranding are noted. There is infrarenal abdominal aortic aneurysm measuring 4.6 cm unchanged compared to prior exam. The aorta is densely calcified. There is no abdominal  lymphadenopathy. There is no small bowel obstruction or diverticulitis. Extensive bowel content is identified throughout colon. The appendix is not seen but no inflammatory changes noted around cecum to suggest appendicitis. A suprapubic catheter is identified in a decompressed bladder limiting evaluation. There are small bilateral pleural effusions with mild dependent atelectasis of the posterior lung bases. Degenerative joint changes of the spine are noted. IMPRESSION: No acute abnormality identified in the abdomen and pelvis. Extensive bowel content identified throughout colon suggesting constipation. Infrarenal abdominal aortic aneurysm unchanged compared to prior exam. Small bilateral  pleural effusions with dependent atelectasis of posterior lung bases. Electronically Signed   By: Abelardo Diesel M.D.   On: 02/26/2015 16:33   Dg Chest Port 1 View  02/20/2015  CLINICAL DATA:  Status post fall today with multiple right rib fractures. Initial encounter. EXAM: PORTABLE CHEST 1 VIEW COMPARISON:  PA and lateral chest and dedicated plain films of the ribs earlier today. FINDINGS: There is no pneumothorax. The lungs are clear. Heart size is enlarged. Right rib fractures are not as well seen on this study as on dedicated plain films of the ribs. IMPRESSION: Negative for pneumothorax. Right rib fractures are better demonstrated on plain films earlier today. Cardiomegaly. Electronically Signed   By: Inge Rise M.D.   On: 02/20/2015 17:47     CBC  Recent Labs Lab 02/24/15 0849 02/25/15 1131 02/26/15 0556 02/27/15 0519 02/28/15 0339  WBC 12.9* 12.1* 14.1* 11.2* 12.8*  HGB 10.8* 10.8* 11.0* 10.9* 10.8*  HCT 32.8* 32.8* 33.3* 33.1* 32.8*  PLT 221 261 300 321 366  MCV 84.1 85.2 85.0 84.6 84.5  MCH 27.7 27.9 28.2 27.9 28.0  MCHC 33.0 32.7 33.1 33.0 33.1  RDW 15.1* 15.4* 15.2* 15.4* 15.2*  LYMPHSABS  --  0.8*  --   --   --   MONOABS  --  1.0  --   --   --   EOSABS  --  0.2  --   --   --   BASOSABS   --  0.1  --   --   --     Chemistries   Recent Labs Lab 02/24/15 0849 02/25/15 1131 02/26/15 0556 02/27/15 0519 02/28/15 0339  NA 133* 137 138 142 141  K 3.8 4.0 3.7 3.5 4.0  CL 103 101 104 105 102  CO2 26 28 28 31 30   GLUCOSE 141* 315* 180* 195* 224*  BUN 24* 29* 30* 30* 34*  CREATININE 1.26* 1.28* 1.25* 1.16 1.21  CALCIUM 7.8* 8.3* 8.3* 8.6* 8.7*   ------------------------------------------------------------------------------------------------------------------ estimated creatinine clearance is 58.2 mL/min (by C-G formula based on Cr of 1.21). ------------------------------------------------------------------------------------------------------------------ No results for input(s): HGBA1C in the last 72 hours. ------------------------------------------------------------------------------------------------------------------ No results for input(s): CHOL, HDL, LDLCALC, TRIG, CHOLHDL, LDLDIRECT in the last 72 hours. ------------------------------------------------------------------------------------------------------------------ No results for input(s): TSH, T4TOTAL, T3FREE, THYROIDAB in the last 72 hours.  Invalid input(s): FREET3 ------------------------------------------------------------------------------------------------------------------ No results for input(s): VITAMINB12, FOLATE, FERRITIN, TIBC, IRON, RETICCTPCT in the last 72 hours.  Coagulation profile  Recent Labs Lab 02/24/15 0849 02/25/15 0543 02/26/15 0556 02/27/15 0519 02/28/15 0339  INR 8.20* 1.55 1.54 2.12 3.30    No results for input(s): DDIMER in the last 72 hours.  Cardiac Enzymes No results for input(s): CKMB, TROPONINI, MYOGLOBIN in the last 168 hours.  Invalid input(s): CK ------------------------------------------------------------------------------------------------------------------ Invalid input(s): POCBNP    Assessment & Plan   75 year male with dementia and atrial fibrillation on  anticoagulation admitted for cough, wheezing found to have pneumonia, also had rib fractures due to fall while getting x-ray. Patient also found to have UTI he has a chronic suprapubic catheter, recurrent UTIs. Noted Temperature 101.9 on admission with WBC 14. Patient blood cultures are negative to date. Flu test negative.    1.  recurrent fevers likely due to combination of aspiration events and also possible pyelonephritis: Seen by ID, they recommended IV ceftriaxone until discharge and then changed to oral Keflex 500 mg twice a day for a total 14 days. Patient has no further fevers. Discharged to skilled nursing on  Monday.  2.Rib fracture: Incentive spirometry if patient able to do 3. Dementia: Observe patient for signs of delirium. Continue Seroquel and trazodone 4. Diabetes: Still elevated blood sugar up to 233 yesterday evening. Adjust the Levemir further. 5. Atrial fibrillation -INR therapeutic, rate controlled  on metoprolol.  6. Essential hypertension: Continue metoprolol, Norvasc and losartan 7. Severe constipation;continue stool softeners.    DNR  Code Status History    Date Active Date Inactive Code Status Order ID Comments User Context   02/20/2015  8:18 PM 02/20/2015  8:19 PM DNR [id number]  Bettey Costa, MD Inpatient   02/20/2015  7:55 PM 02/20/2015  8:18 PM Full Code [id number]  Bettey Costa, MD ED   12/12/2014  9:38 AM 12/13/2014  3:23 AM Full Code [id number]  Inez Catalina, MD HOV    Questions for Most Recent Historical Code Status (Order [id number])    Question Answer Comment   In the event of cardiac or respiratory ARREST Do not call a "code blue"    In the event of cardiac or respiratory ARREST Do not perform Intubation, CPR, defibrillation or ACLS    In the event of cardiac or respiratory ARREST Use medication by any route, position, wound care, and other measures to relive pain and suffering. May use oxygen, suction and manual treatment of airway obstruction as needed for comfort.      Advance Directive Documentation        Most Recent Value   Type of Advance Directive  Healthcare Power of Attorney   Pre-existing out of facility DNR order (yellow form or pink MOST form)     "MOST" Form in Place?             Consults  none  DVT Prophylaxis  coumadin  Lab Results  Component Value Date   PLT 366 02/28/2015     Time Spent in minutes  90min   Razia Screws M.D on 02/28/2015 at 10:39 AM  Between 7am to 6pm - Pager - [phone]  After 6pm go to [email or web address] - password [email or web address] [facility]   Office  [phone]

## 2015-02-28 NOTE — Progress Notes (Signed)
Physical Therapy Treatment Patient Details Name: Shawnee Petzold MRN: [id number] DOB: [birth year] Today's Date: 02/28/2015    History of Present Illness Pt admitted for pneumonia with acute R rib fracture #6-10 from fall. During OP chest x-ray, pt fell of stool and obtained rib fractures. Pt with history of dementia/diabetes/HTN.    PT Comments    Patient is able to follow commands in this session, however usually with multiple attempts to guide patient. In sitting, he continues to lean posteriorly and to his R, even with significant physical and verbal cuing to shift his weight anteriorly and to the left. No sit to stand transfer attempted due to his intermittent command following and posterior lean. With cuing he is able to intermittently weight shift, however he reverts back to postero-lateral lean to the left quickly. Patient does seem to be progressing towards sitting goals, however he would continue to benefit from skilled PT services to increase his overall mobility level.    Follow Up Recommendations  SNF     Equipment Recommendations       Recommendations for Other Services       Precautions / Restrictions Precautions Precautions: Fall Restrictions Weight Bearing Restrictions: No    Mobility  Bed Mobility Overal bed mobility: Needs Assistance Bed Mobility: Supine to Sit;Sit to Supine     Supine to sit: Max assist Sit to supine: Max assist   General bed mobility comments: Patient requires near total assistance for LE mobility over the EOB and bringing his trunk off bed surface.   Transfers                 General transfer comment: Patient began to fatigue and was not appropriate for attempt at transfer.   Ambulation/Gait                 Stairs            Wheelchair Mobility    Modified Rankin (Stroke Patients Only)       Balance Overall balance assessment: History of Falls;Needs assistance Sitting-balance support: Feet  supported;Single extremity supported Sitting balance-Leahy Scale: Poor Sitting balance - Comments: Patient encouraged heavily to learn anteriorly and to the L, transiently completed. Required mod A x1 to maintain sitting balance with use of RUE as well.  Postural control: Posterior lean;Right lateral lean                          Cognition Arousal/Alertness: Awake/alert Behavior During Therapy: WFL for tasks assessed/performed Overall Cognitive Status: History of cognitive impairments - at baseline                      Exercises Other Exercises Other Exercises: Ankle pumps, knee flexion bilaterally x 5 in supine. In sitting LAQs and seated marching x 5 for 2 sets.  Other Exercises: Weight shifting drills to L and anterior completed to get pillow cover x 3 bouts of 1 minute. Significant verbal cuing to bring his trunk to the L and anteriorly.     General Comments        Pertinent Vitals/Pain Pain Assessment: Faces Faces Pain Scale: Hurts a little bit Pain Location: R ribs  (WHile sitting, PT assisting patient with anterolateral weight shifting felt apparent cavitation in R ribs, patient transiently complained of pain however this lasted roughly 3-5", RN informed.) Pain Descriptors / Indicators: Aching Pain Intervention(s): Limited activity within patient's tolerance;Monitored during session    Home Living  Prior Function            PT Goals (current goals can now be found in the care plan section) Acute Rehab PT Goals Patient Stated Goal: to get stronger PT Goal Formulation: With patient Time For Goal Achievement: 03/11/15 Potential to Achieve Goals: Fair Additional Goals Additional Goal #1: Pt will be able to perform bed mobility with min assist in order to improve functional status Progress towards PT goals: Progressing toward goals    Frequency  7X/week    PT Plan Current plan remains appropriate    Co-evaluation              End of Session Equipment Utilized During Treatment: Oxygen Activity Tolerance: Patient tolerated treatment well Patient left: in bed;with call bell/phone within reach;with bed alarm set;with family/visitor present     Time: [id number] PT Time Calculation (min) (ACUTE ONLY): 28 min  Charges:  $Therapeutic Exercise: 8-22 mins $Neuromuscular Re-education: 8-22 mins                    G Codes:      Kerman Passey, PT, DPT    02/28/2015, 3:41 PM

## 2015-03-01 LAB — GLUCOSE, CAPILLARY
GLUCOSE-CAPILLARY: 296 mg/dL — AB (ref 65–99)
Glucose-Capillary: 228 mg/dL — ABNORMAL HIGH (ref 65–99)
Glucose-Capillary: 317 mg/dL — ABNORMAL HIGH (ref 65–99)
Glucose-Capillary: 297 mg/dL — ABNORMAL HIGH (ref 65–99)

## 2015-03-01 LAB — PROTIME-INR
INR: 3.01
PROTHROMBIN TIME: 30.7 s — AB (ref 11.4–15.0)

## 2015-03-01 MED ORDER — INSULIN ASPART 100 UNIT/ML ~~LOC~~ SOLN
8.0000 [IU] | Freq: Three times a day (TID) | SUBCUTANEOUS | Status: DC
Start: 2015-03-01 — End: 2015-03-02
  Administered 2015-03-01 – 2015-03-02 (×4): 8 [IU] via SUBCUTANEOUS
  Filled 2015-03-01 (×4): qty 8

## 2015-03-01 MED ORDER — WARFARIN SODIUM 2 MG PO TABS
4.0000 mg | ORAL_TABLET | Freq: Every day | ORAL | Status: DC
  Administered 2015-03-01: 4 mg via ORAL
  Filled 2015-03-01: qty 2

## 2015-03-01 NOTE — Consult Note (Signed)
ANTICOAGULATION CONSULT NOTE - Follow Up  Pharmacy Consult for warfarin Indication: atrial fibrillation  No Known Allergies  Patient Measurements: Height: 5\' 10"  (177.8 cm) Weight: 195 lb (88.451 kg) IBW/kg (Calculated) : 73  Vital Signs: Temp: 98.6 F (37 C) (02/05 0314) Temp Source: Axillary (02/05 0314) BP: 129/66 mmHg (02/05 0314) Pulse Rate: 84 (02/05 0314)  Labs:  Recent Labs  02/26/15 0556 02/27/15 0519 02/28/15 0339 03/01/15 0347  HGB 11.0* 10.9* 10.8*  --   HCT 33.3* 33.1* 32.8*  --   PLT 300 321 366  --   LABPROT 18.5* 23.6* 32.9* 30.7*  INR 1.54 2.12 3.30 3.01  CREATININE 1.25* 1.16 1.21  --     Estimated Creatinine Clearance: 58.2 mL/min (by C-G formula based on Cr of 1.21).   Assessment: Bobby Jacobs is a 75yo male admitted for weakness and cough. Pt on warfarin as outpatient for Afib. OP regimen is warfarin 7.5mg  PO QHS. Patient's INR on admit was 3.  1/27 admit INR 3 - warfarin 7.5 mg given No INR 1/28-1/29 - warfarin 7.5 mg given each of these days 1/30 INR 5.59. Supratherapeutic. Hold dose and recheck INR with AM labs. 1/31 INR 8.2. Supratherapeutic. Patient received 10mg  IV Vitamin K x1 at 1130 this morning. Follow up and recheck INR with AM labs. 2/1 INR 1.55. Subtherapeutic - warfarin 7.5 mg given 2/2 INR 1.54 2/3 INR 2.12 - 6.5 mg given 2/4 INR 3.3  - held 2/5 INR 3.1 - 4 mg    Goal of Therapy:  INR 2-3 Monitor platelets by anticoagulation protocol: Yes   Plan:  INR 3.01 and falling, will resume lower dose of warfarin today, 4 mg po daily 1800. Pharmacy will continue to monitor and adjust.   Ovid Curd A. Jordan Hawks PharmD, BCPS Clinical Pharmacist 03/01/2015 5:10 AM

## 2015-03-01 NOTE — Progress Notes (Signed)
[name] at [facility]                                                                                                                                                                                            Patient Demographics   Bobby Jacobs, is a 75 y.o. male, DOB - 15-May-1940, [id number]  Admit date - 02/20/2015   Admitting Physician Hillary Bow, MD  Outpatient Primary MD for the patient is [name]., PA   LOS - 9  Subjective:   No new changes.  Review of Systems:   CONSTITUTIONAL: Unable to provide   Vitals:   Filed Vitals:   02/28/15 2045 03/01/15 0314 03/01/15 0758 03/01/15 0809  BP:  129/66 165/84   Pulse:  84 105   Temp:  98.6 F (37 C) 99.1 F (37.3 C)   TempSrc:  Axillary Oral   Resp:  18 18   Height:      Weight:      SpO2: 97% 99% 96% 97%    Wt Readings from Last 3 Encounters:  02/20/15 88.451 kg (195 lb)  02/12/15 86.183 kg (190 lb)  02/10/15 86.183 kg (190 lb)     Intake/Output Summary (Last 24 hours) at 03/01/15 1044 Last data filed at 03/01/15 0400  Gross per 24 hour  Intake    300 ml  Output   1000 ml  Net   -700 ml    Physical Exam:   GENERAL: Chronically ill-appearing HEAD, EYES, EARS, NOSE AND THROAT: Atraumatic, normocephalic. . Pupils equal and reactive to light. Sclerae anicteric. No conjunctival injection. No oro-pharyngeal erythema.  NECK: Supple. There is no jugular venous distention. No bruits, no lymphadenopathy, no thyromegaly.  HEART: Regular rate and rhythm,. No murmurs, no rubs, no clicks.  LUNGS:Bilaterally clear to auscultation.  ABDOMEN: Soft, flat, nontender, nondistended. Has good bowel sounds. No hepatosplenomegaly appreciated.  EXTREMITIES: No evidence of any cyanosis, clubbing, or peripheral edema.  +2 pedal and radial pulses bilaterally.  NEUROLOGIC: Limited exam due to patient unable to follow commands SKIN: Moist and warm with no rashes appreciated.  Psych:  Anxious to depressed LN: No inguinal LN enlargement    Antibiotics   Anti-infectives    Start     Dose/Rate Route Frequency Ordered Stop   02/23/15 1400  clindamycin (CLEOCIN) IVPB 300 mg  Status:  Discontinued     300 mg 100 mL/hr over 30 Minutes Intravenous 3 times per day 02/23/15 1233 02/24/15 1407   02/23/15 1300  cefTRIAXone (ROCEPHIN) 1 g in dextrose 5 % 50 mL IVPB     1 g 100  mL/hr over 30 Minutes Intravenous Every 24 hours 02/23/15 1233     02/21/15 2130  vancomycin (VANCOCIN) 1,250 mg in sodium chloride 0.9 % 250 mL IVPB  Status:  Discontinued     1,250 mg 166.7 mL/hr over 90 Minutes Intravenous Every 18 hours 02/21/15 1224 02/21/15 1239   02/21/15 1930  vancomycin (VANCOCIN) IVPB 750 mg/150 ml premix  Status:  Discontinued     750 mg 150 mL/hr over 60 Minutes Intravenous Every 12 hours 02/21/15 1239 02/23/15 1232   02/21/15 1300  meropenem (MERREM) 1 g in sodium chloride 0.9 % 100 mL IVPB  Status:  Discontinued     1 g 200 mL/hr over 30 Minutes Intravenous Every 12 hours 02/21/15 1208 02/23/15 1232   02/21/15 1245  vancomycin (VANCOCIN) IVPB 1000 mg/200 mL premix     1,000 mg 200 mL/hr over 60 Minutes Intravenous  Once 02/21/15 1239 02/21/15 1443   02/21/15 1230  vancomycin (VANCOCIN) 1,500 mg in sodium chloride 0.9 % 500 mL IVPB  Status:  Discontinued     1,500 mg 250 mL/hr over 120 Minutes Intravenous  Once 02/21/15 1224 02/21/15 1239   02/20/15 2200  piperacillin-tazobactam (ZOSYN) IVPB 3.375 g  Status:  Discontinued     3.375 g 12.5 mL/hr over 240 Minutes Intravenous 3 times per day 02/20/15 2024 02/21/15 1208   02/20/15 1515  levofloxacin (LEVAQUIN) IVPB 500 mg     500 mg 100 mL/hr over 60 Minutes Intravenous  Once 02/20/15 1511 02/20/15 1610      Medications   Scheduled Meds: . amLODipine  10 mg Oral Daily  . atorvastatin  80 mg Oral QHS  . calcium-vitamin D  1 tablet Oral BID  . carbamide peroxide  2 drop Both Ears Once per day on Mon Thu  . cefTRIAXone  (ROCEPHIN)  IV  1 g Intravenous Q24H  . docusate sodium  200 mg Oral BID  . furosemide  20 mg Intravenous Q12H  . insulin aspart  0-5 Units Subcutaneous QHS  . insulin aspart  0-9 Units Subcutaneous TID WC  . insulin aspart  4 Units Subcutaneous TID WC  . insulin glargine  53 Units Subcutaneous Daily  . ipratropium-albuterol  3 mL Nebulization TID  . losartan  100 mg Oral Daily  . metoprolol  75 mg Oral BID  . multivitamin-lutein  1 capsule Oral BID  . nystatin   Topical BID  . polyethylene glycol  17 g Oral BID  . psyllium  1 packet Oral TID  . QUEtiapine  50 mg Oral QHS  . sennosides  10 mL Oral BID  . sodium phosphate  1 enema Rectal Once  . traZODone  150 mg Oral QHS  . vitamin B-12  1,000 mcg Oral Daily  . warfarin  4 mg Oral q1800  . Warfarin - Pharmacist Dosing Inpatient   Does not apply q1800   Continuous Infusions:   PRN Meds:.acetaminophen **OR** acetaminophen, alum & mag hydroxide-simeth, bisacodyl, ibuprofen, ondansetron **OR** ondansetron (ZOFRAN) IV, senna-docusate   Data Review:   Micro Results Recent Results (from the past 240 hour(s))  Culture, blood (routine x 2)     Status: None   Collection Time: 02/20/15  2:39 PM  Result Value Ref Range Status   Specimen Description BLOOD RIGHT HAND  Final   Special Requests BOTTLES DRAWN AEROBIC AND ANAEROBIC  1CC  Final   Culture NO GROWTH 6 DAYS  Final   Report Status 02/26/2015 FINAL  Final  Culture, blood (routine x  2)     Status: None   Collection Time: 02/20/15  2:39 PM  Result Value Ref Range Status   Specimen Description BLOOD LEFT ANTECUBITAL  Final   Special Requests BOTTLES DRAWN AEROBIC AND ANAEROBIC  1CC  Final   Culture NO GROWTH 6 DAYS  Final   Report Status 02/26/2015 FINAL  Final  Urine culture     Status: None   Collection Time: 02/20/15  4:00 PM  Result Value Ref Range Status   Specimen Description URINE, RANDOM  Final   Special Requests NONE  Final   Culture >=100,000 COLONIES/mL PROTEUS  MIRABILIS  Final   Report Status 02/22/2015 FINAL  Final   Organism ID, Bacteria PROTEUS MIRABILIS  Final      Susceptibility   Proteus mirabilis - MIC*    AMPICILLIN >=32 RESISTANT Resistant     CEFAZOLIN 8 SENSITIVE Sensitive     CEFTRIAXONE <=1 SENSITIVE Sensitive     CIPROFLOXACIN >=4 RESISTANT Resistant     GENTAMICIN >=16 RESISTANT Resistant     IMIPENEM 2 SENSITIVE Sensitive     NITROFURANTOIN 128 RESISTANT Resistant     TRIMETH/SULFA >=320 RESISTANT Resistant     AMPICILLIN/SULBACTAM >=32 RESISTANT Resistant     PIP/TAZO <=4 SENSITIVE Sensitive     * >=100,000 COLONIES/mL PROTEUS MIRABILIS  Rapid Influenza A&B Antigens ([facility] only)     Status: None   Collection Time: 02/20/15  5:20 PM  Result Value Ref Range Status   Influenza A ([place]) NEGATIVE  Final   Influenza B ([facility]) NEGATIVE  Final  MRSA PCR Screening     Status: None   Collection Time: 02/20/15  8:55 PM  Result Value Ref Range Status   MRSA by PCR NEGATIVE NEGATIVE Final    Comment:        The GeneXpert MRSA Assay (FDA approved for NASAL specimens only), is one component of a comprehensive MRSA colonization surveillance program. It is not intended to diagnose MRSA infection nor to guide or monitor treatment for MRSA infections.   CULTURE, BLOOD (ROUTINE X 2) w Reflex to PCR ID Panel     Status: None (Preliminary result)   Collection Time: 02/25/15  2:29 PM  Result Value Ref Range Status   Specimen Description BLOOD RIGHT ASSIST CONTROL  Final   Special Requests   Final    BOTTLES DRAWN AEROBIC AND ANAEROBIC AERO 3CC ANA 5CC   Culture NO GROWTH 3 DAYS  Final   Report Status PENDING  Incomplete  CULTURE, BLOOD (ROUTINE X 2) w Reflex to PCR ID Panel     Status: None (Preliminary result)   Collection Time: 02/25/15  2:29 PM  Result Value Ref Range Status   Specimen Description BLOOD LEFT ASSIST CONTROL  Final   Special Requests BOTTLES DRAWN AEROBIC AND ANAEROBIC 3CC  Final   Culture NO GROWTH 3 DAYS  Final    Report Status PENDING  Incomplete    Radiology Reports Dg Chest 1 View  02/24/2015  CLINICAL DATA:  Short of breath EXAM: CHEST 1 VIEW COMPARISON:  02/20/2015 FINDINGS: Cardiac enlargement. Progression of mild interstitial edema. No pleural effusion. Mild bibasilar atelectasis. Right rib fractures noted. IMPRESSION: Progression of mild bilateral airspace disease consistent with interstitial edema. Electronically Signed   By: Franchot Gallo M.D.   On: 02/24/2015 07:15   Dg Chest 1 View  02/20/2015  CLINICAL DATA:  Mildly productive cough and wheezing for the past 2 days ; EXAM: CHEST 1 VIEW PA with one view  lateral chest x-ray. The images are duplicated in PACs. The PA was performed at the [facility] outpatient facility but the patient then faint it and was transported to a [facility] where the lateral was performed. COMPARISON:  PA and lateral chest x-ray of November 05, 2014 FINDINGS: The lungs are adequately inflated. There is mildly increased density in the left lower lobe posteriorly. There is no pleural effusion. The heart is top-normal in size. The pulmonary vascularity is normal. The 7 sternal wires are intact. 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 There is calcification in the wall of the thoracic aorta. The bony thorax exhibits no acute abnormality. IMPRESSION: Subsegmental atelectasis or early pneumonia in the left lower lobe. There is no evidence of pulmonary edema. Followup PA and lateral chest X-ray is recommended in 3-4 weeks following trial of antibiotic therapy to ensure resolution and exclude underlying malignancy. Electronically Signed   By: David  Martinique M.D.   On: 02/20/2015 12:05   Dg Chest 1 View  02/20/2015  CLINICAL DATA:  Mildly productive cough and wheezing for the past 2 days ; EXAM: CHEST 1 VIEW PA with one view lateral chest x-ray. The images are duplicated in PACs. The PA was performed at the [facility] outpatient facility but the patient then faint it and was transported  to a [facility] where the lateral was performed. COMPARISON:  PA and lateral chest x-ray of November 05, 2014 FINDINGS: The lungs are adequately inflated. There is mildly increased density in the left lower lobe posteriorly. There is no pleural effusion. The heart is top-normal in size. The pulmonary vascularity is normal. The 7 sternal wires are intact. 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 There is calcification in the wall of the thoracic aorta. The bony thorax exhibits no acute abnormality. IMPRESSION: Subsegmental atelectasis or early pneumonia in the left lower lobe. There is no evidence of pulmonary edema. Followup PA and lateral chest X-ray is recommended in 3-4 weeks following trial of antibiotic therapy to ensure resolution and exclude underlying malignancy. Electronically Signed   By: David  Martinique M.D.   On: 02/20/2015 12:05   Dg Ribs Unilateral Right  02/20/2015  CLINICAL DATA:  Right-sided rib pain following fall, initial encounter EXAM: RIGHT RIBS - 2 VIEW COMPARISON:  None. FINDINGS: Mildly displaced fractures of the eighth, ninth and tenth ribs are noted on the right posteriorly. No other fractures are seen. No underlying pneumothorax is noted. IMPRESSION: Multiple rib fractures posteriorly on the right consistent with the recent injury. No pneumothorax noted. Electronically Signed   By: Inez Catalina M.D.   On: 02/20/2015 12:32   Ct Head Wo Contrast  02/24/2015  CLINICAL DATA:  Change in pt status, decreased responses, delayed responses. Pinpoint pupils with sluggish response to light. Irregular pulse wit hx. of a-fib. Cool, clammy skin. BG 164. VS WNL. Pt has been on/off CPaP throughout the day due to drowsiness. EXAM: CT HEAD WITHOUT CONTRAST TECHNIQUE: Contiguous axial images were obtained from the base of the skull through the vertex without intravenous contrast. COMPARISON:  02/20/2015 FINDINGS: The ventricles are enlarged, to a greater degree than the sulci, but stable when compared to the  prior exam. Overall atrophy is advanced for age. Encephalomalacia seen along the posterior medial right parietal and adjacent occipital lobe reflecting an old infarct, stable. There is no evidence of a recent cortical infarct. There are no parenchymal masses or mass effect. Periventricular white matter hypoattenuation is noted consistent with mild chronic microvascular ischemic change. There are no extra-axial  masses or abnormal fluid collections. There is no intracranial hemorrhage. Visualized sinuses and mastoid air cells are clear. IMPRESSION: 1. No acute intracranial abnormalities. 2. Advanced atrophy.  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 Electronically Signed   By: Lajean Manes M.D.   On: 02/24/2015 17:43   Ct Head Wo Contrast  02/20/2015  CLINICAL DATA:  Fall today, on Coumadin.  History of stroke. EXAM: CT HEAD WITHOUT CONTRAST TECHNIQUE: Contiguous axial images were obtained from the base of the skull through the vertex without intravenous contrast. COMPARISON:  08/03/2013 FINDINGS: There is atrophy and chronic small vessel disease changes. Old right occipital infarct. No acute intracranial abnormality. Specifically, no hemorrhage, hydrocephalus, mass lesion, acute infarction, or significant intracranial injury. No acute calvarial abnormality. Visualized paranasal sinuses and mastoids clear. Orbital soft tissues unremarkable. IMPRESSION: Old right occipital infarct. No acute intracranial abnormality. Atrophy, chronic microvascular disease. Electronically Signed   By: Rolm Baptise M.D.   On: 02/20/2015 16:46   Ct Chest W Contrast  02/24/2015  CLINICAL DATA:  Follow-up chest x-ray.  Short breath EXAM: CT CHEST WITH CONTRAST TECHNIQUE: Multidetector CT imaging of the chest was performed during intravenous contrast administration. CONTRAST:  55mL OMNIPAQUE IOHEXOL 300 MG/ML  SOLN COMPARISON:  Chest CT 08/10/2011 FINDINGS: Mediastinum/Nodes: No axillary supraclavicular lymphadenopathy. No axillary  adenopathy. The thyroid gland is enlarged coarse calcifications. Mild mediastinal hilar adenopathy is present. 12 mm RIGHT lower paratracheal lymph node for example. 10 mm RIGHT hilar lymph node on image 26 series Lungs/Pleura: Small bilateral pleural effusions with passive atelectasis in the lower lobes. No discrete nodularity. No airspace disease. No pneumothorax. Upper abdomen: Limited view of the liver, kidneys, pancreas are unremarkable. Normal adrenal glands. Musculoskeletal: No aggressive osseous lesion. There is fractures of the posterior lateral sixth through tenth ribs. Several the lower lower ribs fractures are mildly displaced (image 44, series). No pneumothorax with IMPRESSION: 1. Multiple Posterior RIGHT rib fractures (sixth through tenth ribs). No pneumothorax 2. Mild mediastinal adenopathy likely reactive. 3. Small bilateral pleural effusions and passive atelectasis. 4. Nodular from thyroid gland likely represents a benign goiter Electronically Signed   By: Suzy Bouchard M.D.   On: 02/24/2015 09:48   Ct Abdomen Pelvis W Contrast  02/26/2015  CLINICAL DATA:  Generalized abdomen pain EXAM: CT ABDOMEN AND PELVIS WITH CONTRAST TECHNIQUE: Multidetector CT imaging of the abdomen and pelvis was performed using the standard protocol following bolus administration of intravenous contrast. CONTRAST:  158mL OMNIPAQUE IOHEXOL 300 MG/ML  SOLN COMPARISON:  August 04, 2014 FINDINGS: The liver, spleen, pancreas, adrenal glands are normal. The gallbladder is normal. There is a focus of densely opacified colonic diverticulum near the gallbladder unchanged compared to prior exam. There is a 2.5 cm cyst in midpole right kidney. There is no hydronephrosis bilaterally. Mild chronic perinephric stranding are noted. There is infrarenal abdominal aortic aneurysm measuring 4.6 cm unchanged compared to prior exam. The aorta is densely calcified. There is no abdominal lymphadenopathy. There is no small bowel obstruction or  diverticulitis. Extensive bowel content is identified throughout colon. The appendix is not seen but no inflammatory changes noted around cecum to suggest appendicitis. A suprapubic catheter is identified in a decompressed bladder limiting evaluation. There are small bilateral pleural effusions with mild dependent atelectasis of the posterior lung bases. Degenerative joint changes of the spine are noted. IMPRESSION: No acute abnormality identified in the abdomen and pelvis. Extensive bowel content identified throughout colon suggesting constipation. Infrarenal abdominal aortic aneurysm unchanged compared to prior exam. Small bilateral pleural  effusions with dependent atelectasis of posterior lung bases. Electronically Signed   By: Abelardo Diesel M.D.   On: 02/26/2015 16:33   Dg Chest Port 1 View  02/20/2015  CLINICAL DATA:  Status post fall today with multiple right rib fractures. Initial encounter. EXAM: PORTABLE CHEST 1 VIEW COMPARISON:  PA and lateral chest and dedicated plain films of the ribs earlier today. FINDINGS: There is no pneumothorax. The lungs are clear. Heart size is enlarged. Right rib fractures are not as well seen on this study as on dedicated plain films of the ribs. IMPRESSION: Negative for pneumothorax. Right rib fractures are better demonstrated on plain films earlier today. Cardiomegaly. Electronically Signed   By: Inge Rise M.D.   On: 02/20/2015 17:47     CBC  Recent Labs Lab 02/24/15 0849 02/25/15 1131 02/26/15 0556 02/27/15 0519 02/28/15 0339  WBC 12.9* 12.1* 14.1* 11.2* 12.8*  HGB 10.8* 10.8* 11.0* 10.9* 10.8*  HCT 32.8* 32.8* 33.3* 33.1* 32.8*  PLT 221 261 300 321 366  MCV 84.1 85.2 85.0 84.6 84.5  MCH 27.7 27.9 28.2 27.9 28.0  MCHC 33.0 32.7 33.1 33.0 33.1  RDW 15.1* 15.4* 15.2* 15.4* 15.2*  LYMPHSABS  --  0.8*  --   --   --   MONOABS  --  1.0  --   --   --   EOSABS  --  0.2  --   --   --   BASOSABS  --  0.1  --   --   --     Chemistries   Recent  Labs Lab 02/24/15 0849 02/25/15 1131 02/26/15 0556 02/27/15 0519 02/28/15 0339  NA 133* 137 138 142 141  K 3.8 4.0 3.7 3.5 4.0  CL 103 101 104 105 102  CO2 26 28 28 31 30   GLUCOSE 141* 315* 180* 195* 224*  BUN 24* 29* 30* 30* 34*  CREATININE 1.26* 1.28* 1.25* 1.16 1.21  CALCIUM 7.8* 8.3* 8.3* 8.6* 8.7*   ------------------------------------------------------------------------------------------------------------------ estimated creatinine clearance is 58.2 mL/min (by C-G formula based on Cr of 1.21). ------------------------------------------------------------------------------------------------------------------ No results for input(s): HGBA1C in the last 72 hours. ------------------------------------------------------------------------------------------------------------------ No results for input(s): CHOL, HDL, LDLCALC, TRIG, CHOLHDL, LDLDIRECT in the last 72 hours. ------------------------------------------------------------------------------------------------------------------ No results for input(s): TSH, T4TOTAL, T3FREE, THYROIDAB in the last 72 hours.  Invalid input(s): FREET3 ------------------------------------------------------------------------------------------------------------------ No results for input(s): VITAMINB12, FOLATE, FERRITIN, TIBC, IRON, RETICCTPCT in the last 72 hours.  Coagulation profile  Recent Labs Lab 02/25/15 0543 02/26/15 0556 02/27/15 0519 02/28/15 0339 03/01/15 0347  INR 1.55 1.54 2.12 3.30 3.01    No results for input(s): DDIMER in the last 72 hours.  Cardiac Enzymes No results for input(s): CKMB, TROPONINI, MYOGLOBIN in the last 168 hours.  Invalid input(s): CK ------------------------------------------------------------------------------------------------------------------ Invalid input(s): POCBNP    Assessment & Plan   74 year male with dementia and atrial fibrillation on anticoagulation admitted for cough, wheezing found to  have pneumonia, also had rib fractures due to fall while getting x-ray. Patient also found to have UTI he has a chronic suprapubic catheter, recurrent UTIs. Noted Temperature 101.9 on admission with WBC 14. Patient blood cultures are negative to date. Flu test negative.    1.  recurrent fevers likely due to combination of aspiration events and also possible pyelonephritis: Seen by ID, they recommended IV ceftriaxone until discharge and then changed to oral Keflex 500 mg twice a day for a total 14 days. Patient has no further fevers. Discharged to skilled nursing tomorrow 2.Rib  fracture: Incentive spirometry if patient able to do 3. Dementia: Observe patient for signs of delirium. Continue Seroquel and trazodone 4. Diabetes: Uncontrolled. Adjusted the mealtime insulin coverage, levemir.   5. Atrial fibrillation -INR therapeutic, rate controlled  on metoprolol. , Coumadin. 6. Essential hypertension: Continue metoprolol, Norvasc and losartan 7. Severe constipation;continue stool softeners.      DNR  Code Status History    Date Active Date Inactive Code Status Order ID Comments User Context   02/20/2015  8:18 PM 02/20/2015  8:19 PM DNR [id number]  Bettey Costa, MD Inpatient   02/20/2015  7:55 PM 02/20/2015  8:18 PM Full Code [id number]  Bettey Costa, MD ED   12/12/2014  9:38 AM 12/13/2014  3:23 AM Full Code [id number]  Inez Catalina, MD HOV    Questions for Most Recent Historical Code Status (Order [id number])    Question Answer Comment   In the event of cardiac or respiratory ARREST Do not call a "code blue"    In the event of cardiac or respiratory ARREST Do not perform Intubation, CPR, defibrillation or ACLS    In the event of cardiac or respiratory ARREST Use medication by any route, position, wound care, and other measures to relive pain and suffering. May use oxygen, suction and manual treatment of airway obstruction as needed for comfort.     Advance Directive Documentation        Most Recent Value    Type of Advance Directive  Healthcare Power of Attorney   Pre-existing out of facility DNR order (yellow form or pink MOST form)     "MOST" Form in Place?             Consults  none  DVT Prophylaxis  coumadin  Lab Results  Component Value Date   PLT 366 02/28/2015     Time Spent in minutes  47min   Minha Fulco M.D on 03/01/2015 at 10:44 AM  Between 7am to 6pm - Pager - [phone]  After 6pm go to [email or web address] - password [email or web address] [facility]   Office  [phone]

## 2015-03-01 NOTE — Progress Notes (Signed)
PT Cancellation Note  Patient Details Name: Bobby Jacobs MRN: [id number] DOB: [birth year]   Cancelled Treatment:    Reason Eval/Treat Not Completed: Medical issues which prohibited therapy Pt.'s INR is 3 and his prothrombin time is greater than 2.5 of the reference range, which contraindicates PT.    Ladell Pier Jeylin Woodmansee 03/01/2015, 1:45 PM

## 2015-03-02 LAB — GLUCOSE, CAPILLARY
GLUCOSE-CAPILLARY: 245 mg/dL — AB (ref 65–99)
GLUCOSE-CAPILLARY: 366 mg/dL — AB (ref 65–99)

## 2015-03-02 LAB — PROTIME-INR
INR: 2.88
PROTHROMBIN TIME: 29.7 s — AB (ref 11.4–15.0)

## 2015-03-02 MED ORDER — HYDRALAZINE HCL 25 MG PO TABS: 25.0000 mg | ORAL_TABLET | Freq: Two times a day (BID) | ORAL | Status: AC

## 2015-03-02 MED ORDER — BISACODYL 5 MG PO TBEC: 5.0000 mg | DELAYED_RELEASE_TABLET | Freq: Every day | ORAL | Status: AC | PRN

## 2015-03-02 MED ORDER — WARFARIN SODIUM 4 MG PO TABS
4.0000 mg | ORAL_TABLET | Freq: Every day | ORAL | Status: DC
Start: 2015-03-02 — End: 2015-03-02

## 2015-03-02 MED ORDER — WARFARIN SODIUM 2 MG PO TABS: 2.0000 mg | ORAL_TABLET | Freq: Every day | ORAL | Status: AC

## 2015-03-02 MED ORDER — DOCUSATE SODIUM 100 MG PO CAPS: 200.0000 mg | ORAL_CAPSULE | Freq: Two times a day (BID) | ORAL | Status: AC

## 2015-03-02 MED ORDER — HYDRALAZINE HCL 25 MG PO TABS
25.0000 mg | ORAL_TABLET | Freq: Two times a day (BID) | ORAL | Status: DC
  Administered 2015-03-02: 25 mg via ORAL
  Filled 2015-03-02: qty 1

## 2015-03-02 MED ORDER — CEPHALEXIN 500 MG PO CAPS: 500.0000 mg | ORAL_CAPSULE | Freq: Two times a day (BID) | ORAL | Status: AC

## 2015-03-02 NOTE — Progress Notes (Signed)
Patient is medically stable for D/C to [facility] today. Per Bill admissions coordinator at [facility] patient is going to room E-11. RN will call report and arrange EMS for transport. Clinical Education officer, museum (CSW) sent D/C Summary, FL2 and D/C Packet to [employer] via [employer]. Patient is aware of above. CSW contacted patient's wife Charise Killian and made her aware of above. Please reconsult if future social work needs arise. CSW signing off.   Blima Rich, LCSW [phone]

## 2015-03-02 NOTE — Progress Notes (Signed)
BP this morning 185/93, recheck is 174/92, HR 87. BPs have been running 150's-160's/80's-90's.  MD on-call, Dr. Estanislado Pandy, notified. New order received. Nursing continues to monitor.

## 2015-03-02 NOTE — Discharge Instructions (Signed)
Diet;Diet recommendations: Dysphagia 2 (fine chop);Nectar-thick liquid Liquids provided via: Cup Medication Administration: Crushed with puree Supervision: Staff to assist with self feeding;Full supervision/cueing for compensatory strategies;Trained caregiver to feed patient Compensations: Minimize environmental distractions;Slow rate;Small sips/bites;Follow solids with liquid;Multiple dry swallows after each bite/sip;Lingual sweep for clearance of pocketing Postural Changes and/or Swallow Maneuvers: Seated upright 90 degrees

## 2015-03-02 NOTE — Discharge Summary (Signed)
Bobby Jacobs, is a 75 y.o. male  DOB [birth year]  MRN [id number].  Admission date:  02/20/2015  Admitting Physician  Hillary Bow, MD  Discharge Date:  03/02/2015   Primary MD  [name]., PA  Recommendations for primary care physician for things to follow:   follow Up with primary doctor in 1 week.   Admission Diagnosis  Rib fracture [S22.39XA] Injury [T14.90] Pain [R52] Left lower lobe pneumonia [J18.9] Rib fractures, right, closed, initial encounter [S22.31XA]   Discharge Diagnosis  Rib fracture [S22.39XA] Injury [T14.90] Pain [R52] Left lower lobe pneumonia [J18.9] Rib fractures, right, closed, initial encounter [S22.31XA]    Active Problems:   Pneumonia      Past Medical History  Diagnosis Date  . Hypertension   . Coronary artery disease   . Depression   . GERD (gastroesophageal reflux disease)   . Cardiovascular disease   . High cholesterol   . Pneumonia   . Fracture   . Obesity   . Sleep apnea   . CHF (congestive heart failure) ([place])   . Dementia   . Atrial fibrillation ([place])   . Urinary retention   . Urine frequency   . Incomplete bladder emptying   . Stroke ([place])   . Peripheral vascular disease ([place])   . Aortic aneurysm ([place])   . Aortic valve replaced   . Hyperthyroidism   . Diabetes mellitus, type 2 ([place])   . Skin cancer     Past Surgical History  Procedure Laterality Date  . Hernia repair    . Cardiac surgery    . Neck surgery    . Degenerative disk disease    . Circumcision    . Coronary artery bypass graft    . Suprapubic catheter surgery  09/22/2014    placement       History of present illness and  Hospital Course:     Kindly see H&P for history of present illness and admission details, please review complete Labs, Consult reports and Test reports for  all details in brief  HPI  from the history and physical done on the day of admission  75 year old male with dementia, atrial fibrillation on anticoagulation came in because of cough and found to have rib fractures due to fall while getting x-ray of the chest. Patient found to have UTI with temperature 101.9 on admission, WBC 14. Admitted for hospitalist service because of recurrent fevers due to aspiration pneumonia and also pyelonephritis.Marland Kitchen  Hospital Course  1 recurrent fevers due to combination of aspiration  event and possible pyelonephritis: Seen by ID physician Dr. Ola Spurr. Patient received vancomycin, meropenem. on admission then now narrowed down the antibiotic to Rocephin. Patient will need 14 days of for Keflex 500 mg twice a day because of unable aspiration pneumonia and also pyelonephritis: Culture showed Proteus mirabilis. Cultures are no growth. Patient seen by speech therapy evaluation because of aspiration concerned. Speech therapy recommended a dysphagia 3 diet with thin liquids, aspiration precautions Patient had a defervescence of his temperatures.  #2. ribFractures: Patient advised to use incentive spirometry. Physical therapy recommended SNF placement. # elevated INR INR up to 8.2: Resolved after holding the Coumadin, right now INR is 2.9.  No evidence of bleeding, #4 atrial fibrillation: Rate controlled. Because of elevated INR and Coumadin was held and recheck the INR every day and pharmacy managed anticoagulation as per protocol, INR today is 2.9. Needs daily monitoring of INR and adjustment of further dose of Coumadin. #5 diabetes mellitus type 2:   5 diabetes mellitus type 2: Patient is on toujeo. And SSI  #6 essential hypertension: Controlled with metoprolol Norvasc losartan hydralazine has been added here.  7 dementia: Patient is on Seroquel, trazodone,  8. Multiple posterior rib fractures on the right side from 6  To 10 th ribs.. No pneumothorax.  Code;status is DO  NOT RESUSCITATE.   Discharge Condition: Stable   Follow UP      Follow-up Information    Follow up with [facility].   Specialties:  [place]   Contact information:   [address] [place] [phone]      Follow up with [name]., PA In 1 week.   Specialty:  Physician Assistant   Contact information:   [place] [address] [phone]         Discharge Instructions  and  Discharge Medications  Dys. 3 w/ thin liquids; aspiration precautions; monitoring at meals for impulsive eating behavior - single, small bites and sips w/ oral clearing b/t trials.        Medication List    STOP taking these medications        fosfomycin 3 g Pack  Commonly known as:  MONUROL      TAKE these medications        acetaminophen 325 MG tablet  Commonly known as:  TYLENOL  Take 650 mg by mouth every 4 (four) hours as needed for mild pain, fever or headache.     amLODipine 10 MG tablet  Commonly known as:  NORVASC  Take 10 mg by mouth daily.     atorvastatin 80 MG tablet  Commonly known as:  LIPITOR  Take 80 mg by mouth at bedtime.     bisacodyl 5 MG EC tablet  Commonly known as:  DULCOLAX  Take 1 tablet (5 mg total) by mouth daily as needed for moderate constipation.     Calcium-Vitamin D 600-400 MG-UNIT Tabs  Take 1 tablet by mouth 2 (two) times daily.     carbamide peroxide 6.5 % otic solution  Commonly known as:  DEBROX  Place 2 drops into both ears 2 (two) times a week. Pt uses on Tuesday and Friday.     cephALEXin 500 MG capsule  Commonly known as:  KEFLEX  Take 1 capsule (500 mg total) by mouth 2 (two) times daily.     clotrimazole 1 % cream  Commonly known as:  LOTRIMIN  Apply 1 application topically  2 (two) times daily as needed (for itching).     docusate sodium 100 MG capsule  Commonly known as:  COLACE  Take 2 capsules (200 mg total) by  mouth 2 (two) times daily.     FUNGOID TINCTURE 2 % Soln  Generic drug:  Miconazole Nitrate  Apply 1 application topically 2 (two) times daily.     gabapentin 300 MG capsule  Commonly known as:  NEURONTIN  Take 300 mg by mouth 3 (three) times daily.     hydrALAZINE 25 MG tablet  Commonly known as:  APRESOLINE  Take 1 tablet (25 mg total) by mouth 2 (two) times daily.     insulin lispro 100 UNIT/ML injection  Commonly known as:  HUMALOG  Inject 5 Units into the skin 3 (three) times daily with meals.     losartan 100 MG tablet  Commonly known as:  COZAAR  Take 100 mg by mouth daily.     metoprolol 50 MG tablet  Commonly known as:  LOPRESSOR  Take 50 mg by mouth 2 (two) times daily.     polyethylene glycol packet  Commonly known as:  MIRALAX / GLYCOLAX  Take 17 g by mouth 2 (two) times daily.     PRESERVISION AREDS 2 Caps  Take 1 capsule by mouth 2 (two) times daily.     psyllium 95 % Pack  Commonly known as:  HYDROCIL/METAMUCIL  Take 1 packet by mouth 3 (three) times daily.     QUEtiapine 50 MG tablet  Commonly known as:  SEROQUEL  Take 50 mg by mouth at bedtime.     ranitidine 150 MG tablet  Commonly known as:  ZANTAC  Take 150 mg by mouth daily.     TOUJEO SOLOSTAR 300 UNIT/ML Sopn  Generic drug:  Insulin Glargine  Inject 43 Units into the skin daily.     traZODone 50 MG tablet  Commonly known as:  DESYREL  Take 150 mg by mouth at bedtime.     vitamin B-12 1000 MCG tablet  Commonly known as:  CYANOCOBALAMIN  Take 1,000 mcg by mouth daily.     warfarin 2 MG tablet  Commonly known as:  COUMADIN  Take 1 tablet (2 mg total) by mouth daily.          Diet and Activity recommendation: See Discharge Instructions above   Consults obtained -PT, speech therapy, ID   Major procedures and Radiology Reports - PLEASE review detailed and final reports for all details, in brief -     Dg Chest 1 View  02/24/2015  CLINICAL DATA:  Short of breath EXAM: CHEST 1  VIEW COMPARISON:  02/20/2015 FINDINGS: Cardiac enlargement. Progression of mild interstitial edema. No pleural effusion. Mild bibasilar atelectasis. Right rib fractures noted. IMPRESSION: Progression of mild bilateral airspace disease consistent with interstitial edema. Electronically Signed   By: Franchot Gallo M.D.   On: 02/24/2015 07:15   Dg Chest 1 View  02/20/2015  CLINICAL DATA:  Mildly productive cough and wheezing for the past 2 days ; EXAM: CHEST 1 VIEW PA with one view lateral chest x-ray. The images are duplicated in PACs. The PA was performed at the [facility] outpatient facility but the patient then faint it and was transported to a [facility] where the lateral was performed. COMPARISON:  PA and lateral chest x-ray of November 05, 2014 FINDINGS: The lungs are adequately inflated. There is mildly increased density in the left lower lobe posteriorly. There is no pleural effusion. The heart  is top-normal in size. The pulmonary vascularity is normal. The 7 sternal wires are intact. 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 There is calcification in the wall of the thoracic aorta. The bony thorax exhibits no acute abnormality. IMPRESSION: Subsegmental atelectasis or early pneumonia in the left lower lobe. There is no evidence of pulmonary edema. Followup PA and lateral chest X-ray is recommended in 3-4 weeks following trial of antibiotic therapy to ensure resolution and exclude underlying malignancy. Electronically Signed   By: David  Martinique M.D.   On: 02/20/2015 12:05   Dg Chest 1 View  02/20/2015  CLINICAL DATA:  Mildly productive cough and wheezing for the past 2 days ; EXAM: CHEST 1 VIEW PA with one view lateral chest x-ray. The images are duplicated in PACs. The PA was performed at the [facility] outpatient facility but the patient then faint it and was transported to a [facility] where the lateral was performed. COMPARISON:  PA and lateral chest x-ray of November 05, 2014 FINDINGS: The lungs are adequately  inflated. There is mildly increased density in the left lower lobe posteriorly. There is no pleural effusion. The heart is top-normal in size. The pulmonary vascularity is normal. The 7 sternal wires are intact. 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 There is calcification in the wall of the thoracic aorta. The bony thorax exhibits no acute abnormality. IMPRESSION: Subsegmental atelectasis or early pneumonia in the left lower lobe. There is no evidence of pulmonary edema. Followup PA and lateral chest X-ray is recommended in 3-4 weeks following trial of antibiotic therapy to ensure resolution and exclude underlying malignancy. Electronically Signed   By: David  Martinique M.D.   On: 02/20/2015 12:05   Dg Ribs Unilateral Right  02/20/2015  CLINICAL DATA:  Right-sided rib pain following fall, initial encounter EXAM: RIGHT RIBS - 2 VIEW COMPARISON:  None. FINDINGS: Mildly displaced fractures of the eighth, ninth and tenth ribs are noted on the right posteriorly. No other fractures are seen. No underlying pneumothorax is noted. IMPRESSION: Multiple rib fractures posteriorly on the right consistent with the recent injury. No pneumothorax noted. Electronically Signed   By: Inez Catalina M.D.   On: 02/20/2015 12:32   Ct Head Wo Contrast  02/24/2015  CLINICAL DATA:  Change in pt status, decreased responses, delayed responses. Pinpoint pupils with sluggish response to light. Irregular pulse wit hx. of a-fib. Cool, clammy skin. BG 164. VS WNL. Pt has been on/off CPaP throughout the day due to drowsiness. EXAM: CT HEAD WITHOUT CONTRAST TECHNIQUE: Contiguous axial images were obtained from the base of the skull through the vertex without intravenous contrast. COMPARISON:  02/20/2015 FINDINGS: The ventricles are enlarged, to a greater degree than the sulci, but stable when compared to the prior exam. Overall atrophy is advanced for age. Encephalomalacia seen along the posterior medial right parietal and adjacent occipital  lobe reflecting an old infarct, stable. There is no evidence of a recent cortical infarct. There are no parenchymal masses or mass effect. Periventricular white matter hypoattenuation is noted consistent with mild chronic microvascular ischemic change. There are no extra-axial masses or abnormal fluid collections. There is no intracranial hemorrhage. Visualized sinuses and mastoid air cells are clear. IMPRESSION: 1. No acute intracranial abnormalities. 2. Advanced atrophy.  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 Electronically Signed   By: Lajean Manes M.D.   On: 02/24/2015 17:43   Ct Head Wo Contrast  02/20/2015  CLINICAL DATA:  Fall today, on Coumadin.  History of stroke. EXAM: CT HEAD  WITHOUT CONTRAST TECHNIQUE: Contiguous axial images were obtained from the base of the skull through the vertex without intravenous contrast. COMPARISON:  08/03/2013 FINDINGS: There is atrophy and chronic small vessel disease changes. Old right occipital infarct. No acute intracranial abnormality. Specifically, no hemorrhage, hydrocephalus, mass lesion, acute infarction, or significant intracranial injury. No acute calvarial abnormality. Visualized paranasal sinuses and mastoids clear. Orbital soft tissues unremarkable. IMPRESSION: Old right occipital infarct. No acute intracranial abnormality. Atrophy, chronic microvascular disease. Electronically Signed   By: Rolm Baptise M.D.   On: 02/20/2015 16:46   Ct Chest W Contrast  02/24/2015  CLINICAL DATA:  Follow-up chest x-ray.  Short breath EXAM: CT CHEST WITH CONTRAST TECHNIQUE: Multidetector CT imaging of the chest was performed during intravenous contrast administration. CONTRAST:  109mL OMNIPAQUE IOHEXOL 300 MG/ML  SOLN COMPARISON:  Chest CT 08/10/2011 FINDINGS: Mediastinum/Nodes: No axillary supraclavicular lymphadenopathy. No axillary adenopathy. The thyroid gland is enlarged coarse calcifications. Mild mediastinal hilar adenopathy is present. 12 mm RIGHT lower paratracheal  lymph node for example. 10 mm RIGHT hilar lymph node on image 26 series Lungs/Pleura: Small bilateral pleural effusions with passive atelectasis in the lower lobes. No discrete nodularity. No airspace disease. No pneumothorax. Upper abdomen: Limited view of the liver, kidneys, pancreas are unremarkable. Normal adrenal glands. Musculoskeletal: No aggressive osseous lesion. There is fractures of the posterior lateral sixth through tenth ribs. Several the lower lower ribs fractures are mildly displaced (image 44, series). No pneumothorax with IMPRESSION: 1. Multiple Posterior RIGHT rib fractures (sixth through tenth ribs). No pneumothorax 2. Mild mediastinal adenopathy likely reactive. 3. Small bilateral pleural effusions and passive atelectasis. 4. Nodular from thyroid gland likely represents a benign goiter Electronically Signed   By: Suzy Bouchard M.D.   On: 02/24/2015 09:48   Ct Abdomen Pelvis W Contrast  02/26/2015  CLINICAL DATA:  Generalized abdomen pain EXAM: CT ABDOMEN AND PELVIS WITH CONTRAST TECHNIQUE: Multidetector CT imaging of the abdomen and pelvis was performed using the standard protocol following bolus administration of intravenous contrast. CONTRAST:  134mL OMNIPAQUE IOHEXOL 300 MG/ML  SOLN COMPARISON:  August 04, 2014 FINDINGS: The liver, spleen, pancreas, adrenal glands are normal. The gallbladder is normal. There is a focus of densely opacified colonic diverticulum near the gallbladder unchanged compared to prior exam. There is a 2.5 cm cyst in midpole right kidney. There is no hydronephrosis bilaterally. Mild chronic perinephric stranding are noted. There is infrarenal abdominal aortic aneurysm measuring 4.6 cm unchanged compared to prior exam. The aorta is densely calcified. There is no abdominal lymphadenopathy. There is no small bowel obstruction or diverticulitis. Extensive bowel content is identified throughout colon. The appendix is not seen but no inflammatory changes noted around cecum  to suggest appendicitis. A suprapubic catheter is identified in a decompressed bladder limiting evaluation. There are small bilateral pleural effusions with mild dependent atelectasis of the posterior lung bases. Degenerative joint changes of the spine are noted. IMPRESSION: No acute abnormality identified in the abdomen and pelvis. Extensive bowel content identified throughout colon suggesting constipation. Infrarenal abdominal aortic aneurysm unchanged compared to prior exam. Small bilateral pleural effusions with dependent atelectasis of posterior lung bases. Electronically Signed   By: Abelardo Diesel M.D.   On: 02/26/2015 16:33   Dg Chest Port 1 View  02/20/2015  CLINICAL DATA:  Status post fall today with multiple right rib fractures. Initial encounter. EXAM: PORTABLE CHEST 1 VIEW COMPARISON:  PA and lateral chest and dedicated plain films of the ribs earlier today. FINDINGS: There is no  pneumothorax. The lungs are clear. Heart size is enlarged. Right rib fractures are not as well seen on this study as on dedicated plain films of the ribs. IMPRESSION: Negative for pneumothorax. Right rib fractures are better demonstrated on plain films earlier today. Cardiomegaly. Electronically Signed   By: Inge Rise M.D.   On: 02/20/2015 17:47    Micro Results      Recent Results (from the past 240 hour(s))  Culture, blood (routine x 2)     Status: None   Collection Time: 02/20/15  2:39 PM  Result Value Ref Range Status   Specimen Description BLOOD RIGHT HAND  Final   Special Requests BOTTLES DRAWN AEROBIC AND ANAEROBIC  1CC  Final   Culture NO GROWTH 6 DAYS  Final   Report Status 02/26/2015 FINAL  Final  Culture, blood (routine x 2)     Status: None   Collection Time: 02/20/15  2:39 PM  Result Value Ref Range Status   Specimen Description BLOOD LEFT ANTECUBITAL  Final   Special Requests BOTTLES DRAWN AEROBIC AND ANAEROBIC  1CC  Final   Culture NO GROWTH 6 DAYS  Final   Report Status 02/26/2015  FINAL  Final  Urine culture     Status: None   Collection Time: 02/20/15  4:00 PM  Result Value Ref Range Status   Specimen Description URINE, RANDOM  Final   Special Requests NONE  Final   Culture >=100,000 COLONIES/mL PROTEUS MIRABILIS  Final   Report Status 02/22/2015 FINAL  Final   Organism ID, Bacteria PROTEUS MIRABILIS  Final      Susceptibility   Proteus mirabilis - MIC*    AMPICILLIN >=32 RESISTANT Resistant     CEFAZOLIN 8 SENSITIVE Sensitive     CEFTRIAXONE <=1 SENSITIVE Sensitive     CIPROFLOXACIN >=4 RESISTANT Resistant     GENTAMICIN >=16 RESISTANT Resistant     IMIPENEM 2 SENSITIVE Sensitive     NITROFURANTOIN 128 RESISTANT Resistant     TRIMETH/SULFA >=320 RESISTANT Resistant     AMPICILLIN/SULBACTAM >=32 RESISTANT Resistant     PIP/TAZO <=4 SENSITIVE Sensitive     * >=100,000 COLONIES/mL PROTEUS MIRABILIS  Rapid Influenza A&B Antigens ([facility] only)     Status: None   Collection Time: 02/20/15  5:20 PM  Result Value Ref Range Status   Influenza A ([place]) NEGATIVE  Final   Influenza B ([facility]) NEGATIVE  Final  MRSA PCR Screening     Status: None   Collection Time: 02/20/15  8:55 PM  Result Value Ref Range Status   MRSA by PCR NEGATIVE NEGATIVE Final    Comment:        The GeneXpert MRSA Assay (FDA approved for NASAL specimens only), is one component of a comprehensive MRSA colonization surveillance program. It is not intended to diagnose MRSA infection nor to guide or monitor treatment for MRSA infections.   CULTURE, BLOOD (ROUTINE X 2) w Reflex to PCR ID Panel     Status: None (Preliminary result)   Collection Time: 02/25/15  2:29 PM  Result Value Ref Range Status   Specimen Description BLOOD RIGHT ASSIST CONTROL  Final   Special Requests   Final    BOTTLES DRAWN AEROBIC AND ANAEROBIC AERO 3CC ANA 5CC   Culture NO GROWTH 3 DAYS  Final   Report Status PENDING  Incomplete  CULTURE, BLOOD (ROUTINE X 2) w Reflex to PCR ID Panel     Status: None (Preliminary  result)   Collection Time: 02/25/15  2:29 PM  Result Value Ref Range Status   Specimen Description BLOOD LEFT ASSIST CONTROL  Final   Special Requests BOTTLES DRAWN AEROBIC AND ANAEROBIC 3CC  Final   Culture NO GROWTH 3 DAYS  Final   Report Status PENDING  Incomplete       Today   Subjective:   Bobby Jacobs today has no headache,no chest abdominal pain,no new weakness tingling or numbness,stable for discharge  Objective:   Blood pressure 155/86, pulse 83, temperature 98.2 F (36.8 C), temperature source Oral, resp. rate 18, height 5\' 10"  (1.778 m), weight 88.451 kg (195 lb), SpO2 95 %.   Intake/Output Summary (Last 24 hours) at 03/02/15 0937 Last data filed at 03/02/15 0411  Gross per 24 hour  Intake    120 ml  Output   1775 ml  Net  -1655 ml    Exam Awake Alert, Oriented x 3, No new F.N deficits, Normal affect [place].AT,PERRAL Supple Neck,No JVD, No cervical lymphadenopathy appriciated.  Symmetrical Chest wall movement, Good air movement bilaterally, CTAB RRR,No Gallops,Rubs or new Murmurs, No Parasternal Heave +ve B.Sounds, Abd Soft, Non tender, No organomegaly appriciated, No rebound -guarding or rigidity. No Cyanosis, Clubbing or edema, No new Rash or bruise  Data Review   CBC w Diff:  Lab Results  Component Value Date   WBC 12.8* 02/28/2015   WBC 10.1 11/28/2013   HGB 10.8* 02/28/2015   HGB 13.8 11/28/2013   HCT 32.8* 02/28/2015   HCT 42.2 11/28/2013   PLT 366 02/28/2015   PLT 208 11/28/2013   LYMPHOPCT 7 02/25/2015   MONOPCT 8 02/25/2015   EOSPCT 1 02/25/2015   BASOPCT 1 02/25/2015    CMP:  Lab Results  Component Value Date   NA 141 02/28/2015   NA 144 11/28/2013   K 4.0 02/28/2015   K 3.9 11/28/2013   CL 102 02/28/2015   CL 108* 11/28/2013   CO2 30 02/28/2015   CO2 32 11/28/2013   BUN 34* 02/28/2015   BUN 26* 11/28/2013   CREATININE 1.21 02/28/2015   CREATININE 1.38* 11/28/2013   PROT 6.2* 02/20/2015   PROT 7.3 11/28/2013   ALBUMIN  3.2* 02/20/2015   ALBUMIN 3.8 11/28/2013   BILITOT 1.1 02/20/2015   BILITOT 0.8 11/28/2013   ALKPHOS 76 02/20/2015   ALKPHOS 117* 11/28/2013   AST 23 02/20/2015   AST 24 11/28/2013   ALT 19 02/20/2015   ALT 20 11/28/2013  .   Total Time in preparing paper work, data evaluation and todays exam - 66 minutes  Reene Harlacher M.D on 03/02/2015 at 9:37 AM    Note: This dictation was prepared with Dragon dictation along with smaller phrase technology. Any transcriptional errors that result from this process are unintentional.

## 2015-03-02 NOTE — Care Management Important Message (Signed)
Important Message  Patient Details  Name: Bobby Jacobs MRN: [id number] Date of Birth: 1940-11-20   Medicare Important Message Given:  Yes    Keishia Ground A, RN 03/02/2015, 7:26 AM

## 2015-03-02 NOTE — Progress Notes (Signed)
Pt to d/c to [facility] today for rehab. Report called to Amy at [phone]. EMS will transport patient to facility, wife will meet him there. Caregiver has called unit multiple times requesting changes made to medications. Informed caregiver to please address any medication concerns with pt's wife as she is his POA and would have to request those changes.

## 2015-03-02 NOTE — Clinical Social Work Placement (Signed)
   CLINICAL SOCIAL WORK PLACEMENT  NOTE  Date:  03/02/2015  Patient Details  Name: Bobby Jacobs MRN: [id number] Date of Birth: 09-06-1940  Clinical Social Work is seeking post-discharge placement for this patient at the [place] level of care (*CSW will initial, date and re-position this form in  chart as items are completed):  Yes   Patient/family provided with [place] Work Department's list of facilities offering this level of care within the geographic area requested by the patient (or if unable, by the patient's family).  Yes   Patient/family informed of their freedom to choose among providers that offer the needed level of care, that participate in Medicare, Medicaid or managed care program needed by the patient, have an available bed and are willing to accept the patient.  Yes   Patient/family informed of [place]'s ownership interest in [facility] and [facility], as well as of the fact that they are under no obligation to receive care at these facilities.  PASRR submitted to EDS on       PASRR number received on       Existing PASRR number confirmed on 02/25/15     FL2 transmitted to all facilities in geographic area requested by pt/family on 02/24/15     FL2 transmitted to all facilities within larger geographic area on       Patient informed that his/her managed care company has contracts with or will negotiate with certain facilities, including the following:        Yes   Patient/family informed of bed offers received.  Patient chooses bed at  [facility] )     Physician recommends and patient chooses bed at      Patient to be transferred to  [facility] ) on 03/02/15.  Patient to be transferred to facility by  [facility] )     Patient family notified on 03/02/15 of transfer.  Name of family member notified:   (Patient's wife Bobby Jacobs is aware of D/C today. )     PHYSICIAN       Additional  Comment:    _______________________________________________ Loralyn Freshwater, LCSW 03/02/2015, 10:26 AM

## 2015-03-02 NOTE — Consult Note (Signed)
ANTICOAGULATION CONSULT NOTE - Follow Up  Pharmacy Consult for warfarin Indication: atrial fibrillation  No Known Allergies  Patient Measurements: Height: 5\' 10"  (177.8 cm) Weight: 195 lb (88.451 kg) IBW/kg (Calculated) : 73  Vital Signs: Temp: 98.2 F (36.8 C) (02/06 0737) Temp Source: Oral (02/06 0737) BP: 155/86 mmHg (02/06 0737) Pulse Rate: 83 (02/06 0737)  Labs:  Recent Labs  02/28/15 0339 03/01/15 0347 03/02/15 0413  HGB 10.8*  --   --   HCT 32.8*  --   --   PLT 366  --   --   LABPROT 32.9* 30.7* 29.7*  INR 3.30 3.01 2.88  CREATININE 1.21  --   --     Estimated Creatinine Clearance: 58.2 mL/min (by C-G formula based on Cr of 1.21).   Assessment: LA is a 75yo male admitted for weakness and cough. Pt on warfarin as outpatient for Afib. OP regimen is warfarin 7.5mg  PO QHS. Patient's INR on admit was 3.  1/27 admit INR 3 - warfarin 7.5 mg given No INR 1/28-1/29 - warfarin 7.5 mg given each of these days 1/30 INR 5.59. Supratherapeutic. Hold dose and recheck INR with AM labs. 1/31 INR 8.2. Supratherapeutic. Patient received 10mg  IV Vitamin K x1 at 1130 this morning. Follow up and recheck INR with AM labs. 2/1 INR 1.55. Subtherapeutic - warfarin 7.5 mg given 2/2 INR 1.54 2/3 INR 2.12 - 6.5 mg given 2/4 INR 3.3  - held 2/5 INR 3.1 - 4 mg 2/6 INR 2.88    Goal of Therapy:  INR 2-3 Monitor platelets by anticoagulation protocol: Yes   Plan:  INR 2.88 now within goal range, will continue warfarin 4 mg po daily 1800 for today. Pharmacy will continue to monitor INR and adjust.    Rayna Sexton, PharmD, BCPS Clinical Pharmacist 03/02/2015 10:08 AM

## 2015-03-03 LAB — CULTURE, BLOOD (ROUTINE X 2)
CULTURE: NO GROWTH
Culture: NO GROWTH

## 2015-04-25 DEATH — deceased

## 2016-01-15 ENCOUNTER — Ambulatory Visit: Payer: Medicare Other | Admitting: Urology

## 2016-08-12 IMAGING — CT CT ABD-PELV W/ CM
1 of 3 series · 14 of 32 positions shown, 19 images · IV contrast (omnipaque)
Comparison: August 04, 2014

CLINICAL DATA: Generalized abdomen pain

EXAM:
CT ABDOMEN AND PELVIS WITH CONTRAST
TECHNIQUE: Multidetector CT imaging of the abdomen and pelvis was performed
using the standard protocol following bolus administration of
intravenous contrast.
CONTRAST:  100mL OMNIPAQUE IOHEXOL 300 MG/ML  SOLN

[Series 2: routine abd pel with · axial · 0.82mm/px · z∈[-405,+50]mm · 14 of 103 slices shown, 19 images]
[im 6/103  soft-tissue]
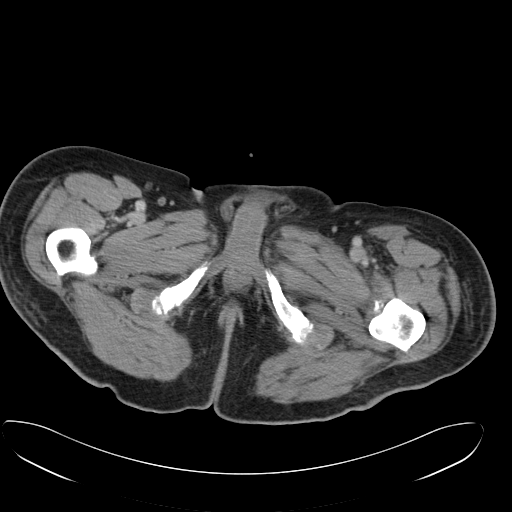
[im 6/103  bone]
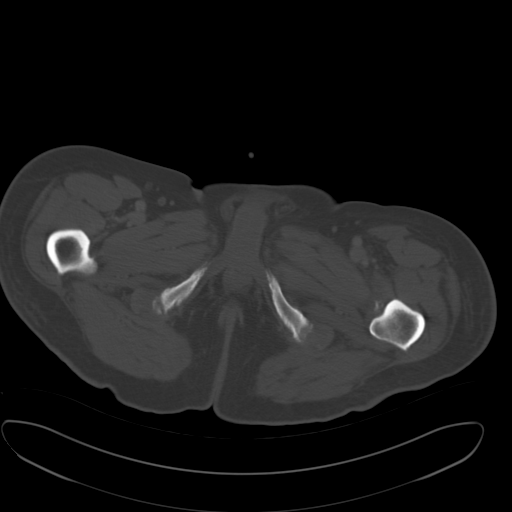
[im 16/103  soft-tissue]
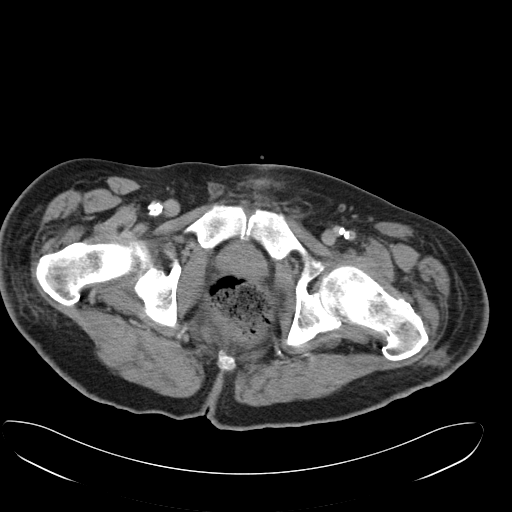
[im 21/103  soft-tissue]
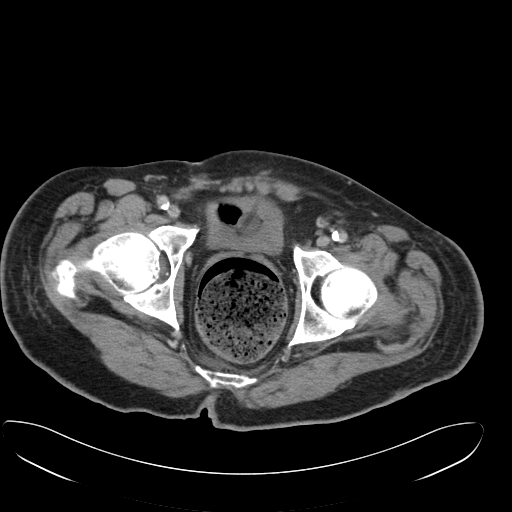
[im 31/103  soft-tissue]
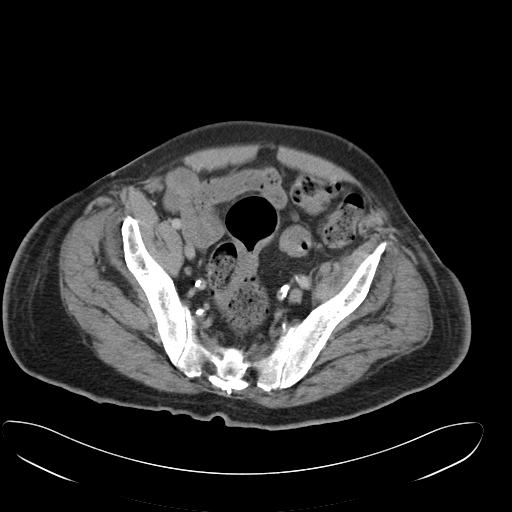
[im 36/103  soft-tissue]
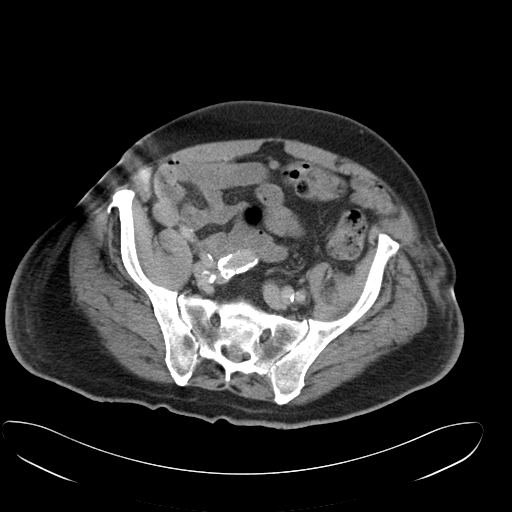
[im 46/103  soft-tissue]
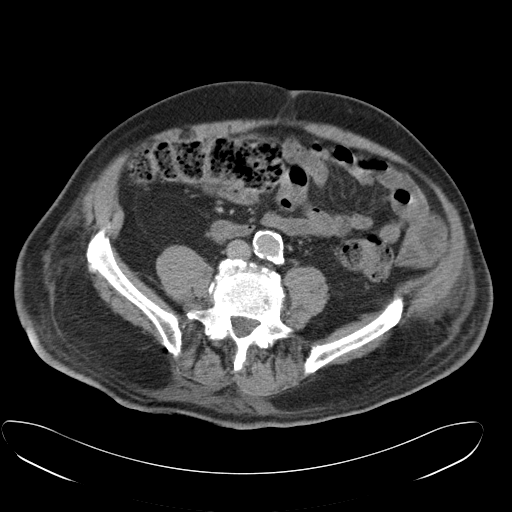
[im 52/103  soft-tissue]
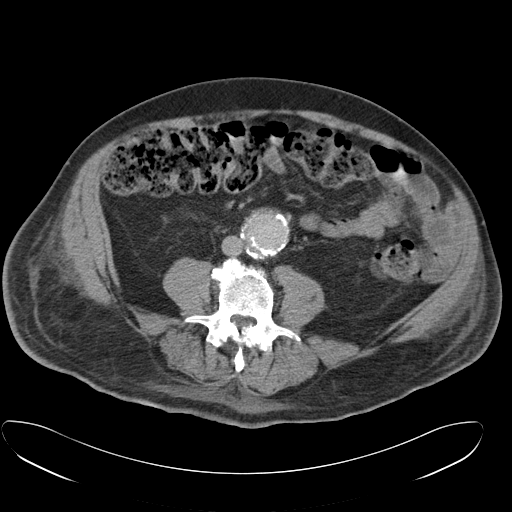
[im 57/103  soft-tissue]
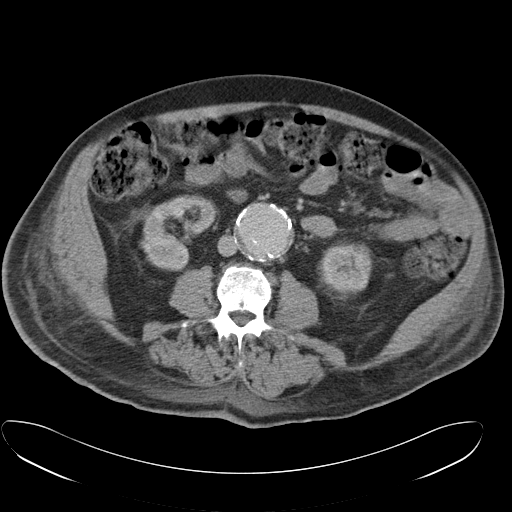
[im 67/103  soft-tissue]
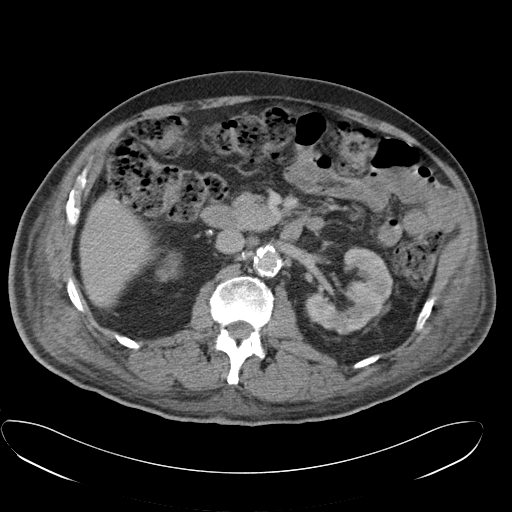
[im 67/103  bone]
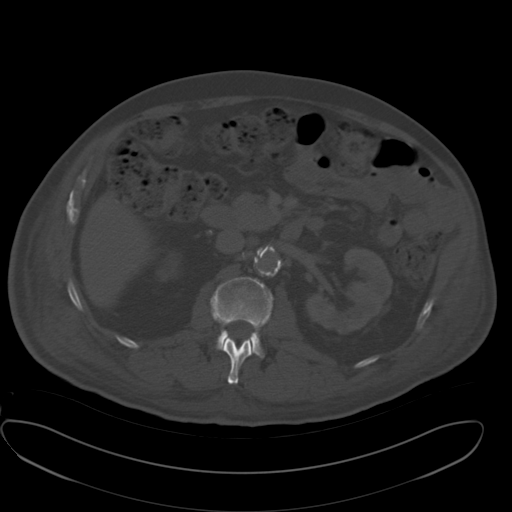
[im 72/103  soft-tissue]
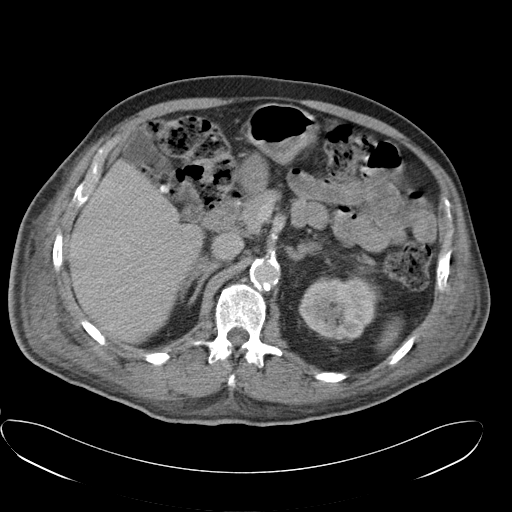
[im 82/103  soft-tissue]
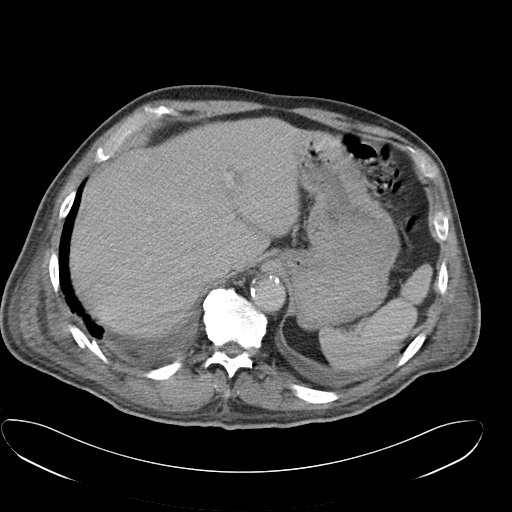
[im 82/103  lung]
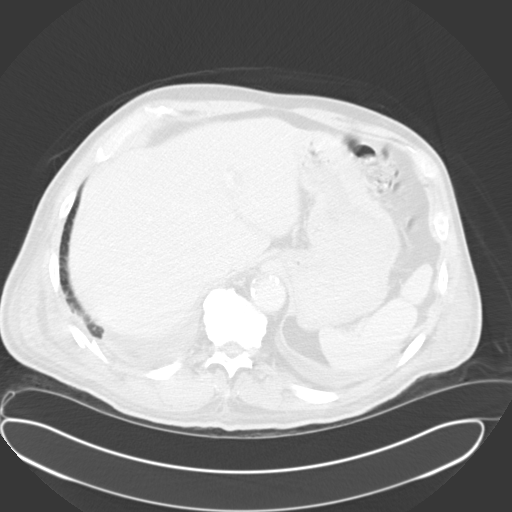
[im 87/103  soft-tissue]
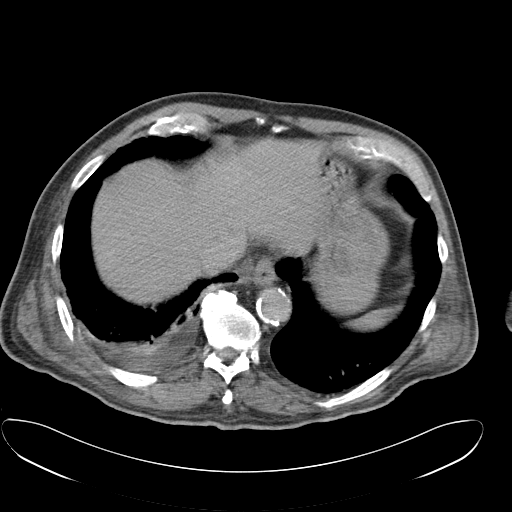
[im 87/103  lung]
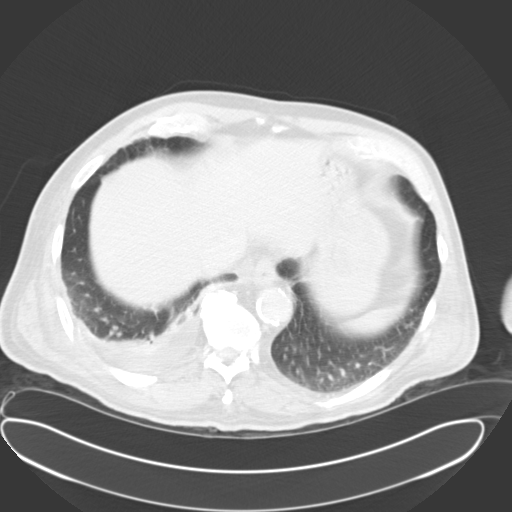
[im 92/103  lung]
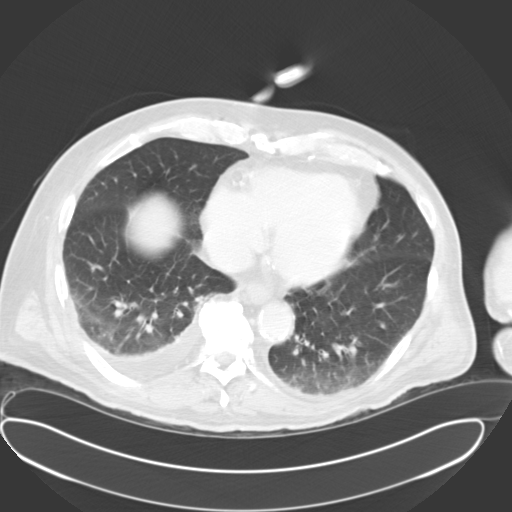
[im 97/103  soft-tissue]
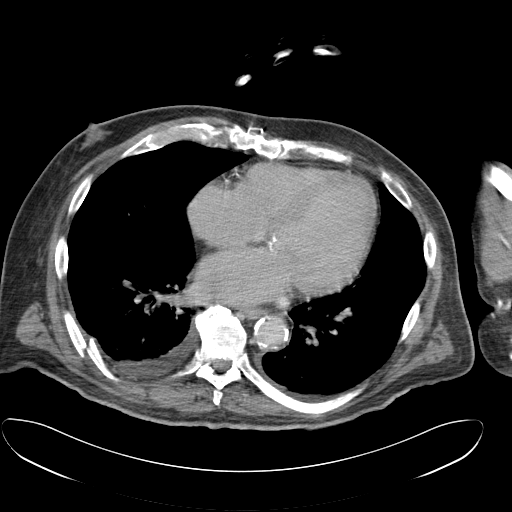
[im 97/103  lung]
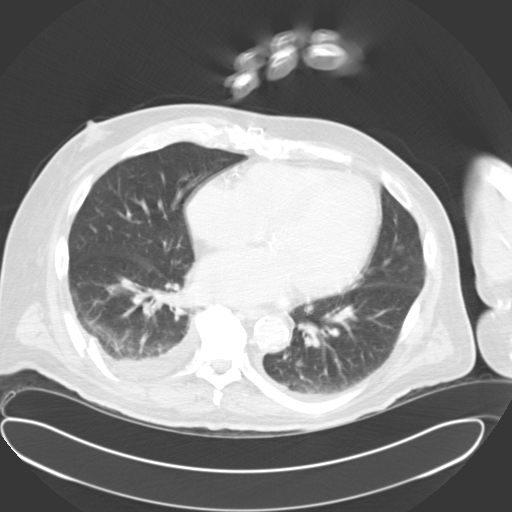

[14 of 32 positions shown; findings below may reference images not displayed]

FINDINGS: The liver, spleen, pancreas, adrenal glands are normal. The
gallbladder is normal. There is a focus of densely opacified colonic
diverticulum near the gallbladder unchanged compared to prior exam.

There is a 2.5 cm cyst in midpole right kidney. There is no
hydronephrosis bilaterally. Mild chronic perinephric stranding are
noted.

There is infrarenal abdominal aortic aneurysm measuring 4.6 cm
unchanged compared to prior exam. The aorta is densely calcified.
There is no abdominal lymphadenopathy.

There is no small bowel obstruction or diverticulitis. Extensive
bowel content is identified throughout colon. The appendix is not
seen but no inflammatory changes noted around cecum to suggest
appendicitis.

A suprapubic catheter is identified in a decompressed bladder
limiting evaluation.

There are small bilateral pleural effusions with mild dependent
atelectasis of the posterior lung bases. Degenerative joint changes
of the spine are noted.
IMPRESSION: No acute abnormality identified in the abdomen and pelvis.

Extensive bowel content identified throughout colon suggesting
constipation.

Infrarenal abdominal aortic aneurysm unchanged compared to prior
exam.

Small bilateral pleural effusions with dependent atelectasis of
posterior lung bases.
